# Patient Record
Sex: Male | Born: 1960 | Race: White | Hispanic: No | Smoking: Current every day smoker
Health system: Southern US, Community
[De-identification: ages and names within clinical notes are randomized; demographics above are authoritative.]

## PROBLEM LIST (undated history)

## (undated) DIAGNOSIS — M109 Gout, unspecified: Secondary | ICD-10-CM

## (undated) DIAGNOSIS — I10 Essential (primary) hypertension: Secondary | ICD-10-CM

## (undated) DIAGNOSIS — I209 Angina pectoris, unspecified: Secondary | ICD-10-CM

## (undated) DIAGNOSIS — F32A Depression, unspecified: Secondary | ICD-10-CM

## (undated) DIAGNOSIS — Z0000 Encounter for general adult medical examination without abnormal findings: Secondary | ICD-10-CM

## (undated) DIAGNOSIS — M25559 Pain in unspecified hip: Secondary | ICD-10-CM

## (undated) DIAGNOSIS — M549 Dorsalgia, unspecified: Secondary | ICD-10-CM

## (undated) DIAGNOSIS — F419 Anxiety disorder, unspecified: Secondary | ICD-10-CM

## (undated) DIAGNOSIS — Z720 Tobacco use: Secondary | ICD-10-CM

## (undated) DIAGNOSIS — M1990 Unspecified osteoarthritis, unspecified site: Secondary | ICD-10-CM

## (undated) DIAGNOSIS — M79643 Pain in unspecified hand: Secondary | ICD-10-CM

## (undated) DIAGNOSIS — R079 Chest pain, unspecified: Secondary | ICD-10-CM

## (undated) DIAGNOSIS — I739 Peripheral vascular disease, unspecified: Secondary | ICD-10-CM

## (undated) DIAGNOSIS — E785 Hyperlipidemia, unspecified: Secondary | ICD-10-CM

## (undated) DIAGNOSIS — K219 Gastro-esophageal reflux disease without esophagitis: Secondary | ICD-10-CM

## (undated) DIAGNOSIS — M545 Low back pain: Secondary | ICD-10-CM

## (undated) DIAGNOSIS — R05 Cough: Secondary | ICD-10-CM

## (undated) DIAGNOSIS — E119 Type 2 diabetes mellitus without complications: Secondary | ICD-10-CM

## (undated) DIAGNOSIS — N529 Male erectile dysfunction, unspecified: Secondary | ICD-10-CM

## (undated) DIAGNOSIS — R0600 Dyspnea, unspecified: Secondary | ICD-10-CM

## (undated) DIAGNOSIS — G4700 Insomnia, unspecified: Secondary | ICD-10-CM

## (undated) HISTORY — DX: Type 2 diabetes mellitus without complications: E11.9

## (undated) HISTORY — DX: Chest pain, unspecified: R07.9

## (undated) HISTORY — DX: Gout, unspecified: M10.9

## (undated) HISTORY — DX: Encounter for general adult medical examination without abnormal findings: Z00.00

## (undated) HISTORY — DX: Dorsalgia, unspecified: M54.9

## (undated) HISTORY — DX: Low back pain: M54.5

## (undated) HISTORY — DX: Hyperlipidemia, unspecified: E78.5

## (undated) HISTORY — DX: Pain in unspecified hip: M25.559

## (undated) HISTORY — DX: Tobacco use: Z72.0

## (undated) HISTORY — PX: CARDIAC CATHETERIZATION: SHX172

## (undated) HISTORY — DX: Gastro-esophageal reflux disease without esophagitis: K21.9

## (undated) HISTORY — PX: OTHER SURGICAL HISTORY: SHX169

## (undated) HISTORY — PX: ILIAC ARTERY STENT: SHX1786

## (undated) HISTORY — DX: Male erectile dysfunction, unspecified: N52.9

## (undated) HISTORY — DX: Insomnia, unspecified: G47.00

## (undated) HISTORY — DX: Peripheral vascular disease, unspecified: I73.9

## (undated) HISTORY — DX: Pain in unspecified hand: M79.643

## (undated) HISTORY — DX: Cough: R05

---

## 1998-09-24 ENCOUNTER — Emergency Department (HOSPITAL_COMMUNITY): Admission: EM | Admit: 1998-09-24 | Discharge: 1998-09-24 | Payer: Self-pay | Admitting: Emergency Medicine

## 1999-04-20 ENCOUNTER — Encounter: Payer: Self-pay | Admitting: Emergency Medicine

## 1999-04-20 ENCOUNTER — Emergency Department (HOSPITAL_COMMUNITY): Admission: EM | Admit: 1999-04-20 | Discharge: 1999-04-20 | Payer: Self-pay | Admitting: Emergency Medicine

## 2009-09-23 ENCOUNTER — Observation Stay (HOSPITAL_COMMUNITY): Admission: EM | Admit: 2009-09-23 | Discharge: 2009-09-24 | Payer: Self-pay | Admitting: Emergency Medicine

## 2009-10-13 ENCOUNTER — Ambulatory Visit: Payer: Self-pay | Admitting: Family Medicine

## 2009-10-13 LAB — CONVERTED CEMR LAB: Microalb, Ur: 0.5 mg/dL (ref 0.00–1.89)

## 2009-10-14 ENCOUNTER — Ambulatory Visit (HOSPITAL_COMMUNITY): Admission: RE | Admit: 2009-10-14 | Discharge: 2009-10-14 | Payer: Self-pay | Admitting: Family Medicine

## 2010-06-20 LAB — CK TOTAL AND CKMB (NOT AT ARMC)
CK, MB: 1.8 ng/mL (ref 0.3–4.0)
Relative Index: 1 (ref 0.0–2.5)
Relative Index: 1 (ref 0.0–2.5)
Total CK: 185 U/L (ref 7–232)
Total CK: 200 U/L (ref 7–232)

## 2010-06-20 LAB — DIFFERENTIAL
Basophils Absolute: 0 10*3/uL (ref 0.0–0.1)
Basophils Relative: 1 % (ref 0–1)
Eosinophils Absolute: 0.1 10*3/uL (ref 0.0–0.7)
Eosinophils Relative: 1 % (ref 0–5)
Lymphocytes Relative: 29 % (ref 12–46)
Monocytes Absolute: 0.7 10*3/uL (ref 0.1–1.0)

## 2010-06-20 LAB — BASIC METABOLIC PANEL
CO2: 22 mEq/L (ref 19–32)
Chloride: 107 mEq/L (ref 96–112)
GFR calc Af Amer: >60 mL/min (ref >60)
Sodium: 137 mEq/L (ref 135–145)

## 2010-06-20 LAB — GLUCOSE, CAPILLARY: Glucose-Capillary: 106 mg/dL — ABNORMAL HIGH (ref 70–99)

## 2010-06-20 LAB — URINE MICROSCOPIC-ADD ON

## 2010-06-20 LAB — URINALYSIS, ROUTINE W REFLEX MICROSCOPIC
Glucose, UA: >1000 mg/dL — AB
Hgb urine dipstick: NEGATIVE
Leukocytes, UA: NEGATIVE
Protein, ur: 30 mg/dL — AB
Specific Gravity, Urine: 1.031 — ABNORMAL HIGH (ref 1.005–1.030)
Urobilinogen, UA: 0.2 mg/dL (ref 0.0–1.0)

## 2010-06-20 LAB — CBC
Hemoglobin: 15.1 g/dL (ref 13.0–17.0)
MCH: 30.5 pg (ref 26.0–34.0)
Platelets: 294 10*3/uL (ref 150–400)
RBC: 4.94 MIL/uL (ref 4.22–5.81)
WBC: 8.6 10*3/uL (ref 4.0–10.5)

## 2010-06-20 LAB — URINE CULTURE
Colony Count: NO GROWTH
Culture: NO GROWTH

## 2010-06-20 LAB — COMPREHENSIVE METABOLIC PANEL
ALT: 23 U/L (ref 0–53)
AST: 28 U/L (ref 0–37)
Albumin: 3.8 g/dL (ref 3.5–5.2)
Alkaline Phosphatase: 63 U/L (ref 39–117)
CO2: 25 mEq/L (ref 19–32)
Chloride: 97 mEq/L (ref 96–112)
GFR calc Af Amer: 51 mL/min — ABNORMAL LOW (ref >60)
GFR calc non Af Amer: 43 mL/min — ABNORMAL LOW (ref >60)
Potassium: 3.7 mEq/L (ref 3.5–5.1)
Sodium: 133 mEq/L — ABNORMAL LOW (ref 135–145)
Total Bilirubin: 0.7 mg/dL (ref 0.3–1.2)

## 2010-06-20 LAB — TROPONIN I: Troponin I: <0.01 ng/mL (ref 0.00–0.06)

## 2010-06-20 LAB — POCT CARDIAC MARKERS: Myoglobin, poc: 114 ng/mL (ref 12–200)

## 2010-06-20 LAB — PHOSPHORUS: Phosphorus: 4 mg/dL (ref 2.3–4.6)

## 2010-06-20 LAB — KETONES, QUALITATIVE: Acetone, Bld: NEGATIVE

## 2010-06-20 LAB — POCT I-STAT 3, VENOUS BLOOD GAS (G3P V)
Acid-Base Excess: 1 mmol/L (ref 0.0–2.0)
O2 Saturation: 98 %
pO2, Ven: 102 mmHg — ABNORMAL HIGH (ref 30.0–45.0)

## 2011-12-01 ENCOUNTER — Emergency Department (HOSPITAL_COMMUNITY): Payer: Self-pay

## 2011-12-01 ENCOUNTER — Emergency Department (HOSPITAL_COMMUNITY)
Admission: EM | Admit: 2011-12-01 | Discharge: 2011-12-01 | Disposition: A | Discharge status: Home / Self Care | Payer: Self-pay | Attending: Emergency Medicine | Admitting: Emergency Medicine

## 2011-12-01 ENCOUNTER — Encounter (HOSPITAL_COMMUNITY): Payer: Self-pay | Admitting: Adult Health

## 2011-12-01 DIAGNOSIS — R202 Paresthesia of skin: Secondary | ICD-10-CM | POA: Diagnosis present

## 2011-12-01 DIAGNOSIS — R0602 Shortness of breath: Secondary | ICD-10-CM | POA: Insufficient documentation

## 2011-12-01 DIAGNOSIS — I498 Other specified cardiac arrhythmias: Secondary | ICD-10-CM | POA: Diagnosis present

## 2011-12-01 DIAGNOSIS — E119 Type 2 diabetes mellitus without complications: Secondary | ICD-10-CM | POA: Insufficient documentation

## 2011-12-01 DIAGNOSIS — F17200 Nicotine dependence, unspecified, uncomplicated: Secondary | ICD-10-CM | POA: Insufficient documentation

## 2011-12-01 DIAGNOSIS — R112 Nausea with vomiting, unspecified: Secondary | ICD-10-CM | POA: Insufficient documentation

## 2011-12-01 DIAGNOSIS — R209 Unspecified disturbances of skin sensation: Secondary | ICD-10-CM | POA: Insufficient documentation

## 2011-12-01 DIAGNOSIS — I10 Essential (primary) hypertension: Secondary | ICD-10-CM | POA: Insufficient documentation

## 2011-12-01 DIAGNOSIS — Z79899 Other long term (current) drug therapy: Secondary | ICD-10-CM | POA: Insufficient documentation

## 2011-12-01 DIAGNOSIS — I4902 Ventricular flutter: Secondary | ICD-10-CM | POA: Insufficient documentation

## 2011-12-01 HISTORY — DX: Essential (primary) hypertension: I10

## 2011-12-01 LAB — BASIC METABOLIC PANEL
BUN: 27 mg/dL — ABNORMAL HIGH (ref 6–23)
GFR calc Af Amer: 80 mL/min — ABNORMAL LOW (ref >90)
GFR calc non Af Amer: 69 mL/min — ABNORMAL LOW (ref >90)
Potassium: 4.2 mEq/L (ref 3.5–5.1)
Sodium: 136 mEq/L (ref 135–145)

## 2011-12-01 LAB — CBC WITH DIFFERENTIAL/PLATELET
Basophils Absolute: 0 10*3/uL (ref 0.0–0.1)
Basophils Relative: 1 % (ref 0–1)
Eosinophils Absolute: 0.2 10*3/uL (ref 0.0–0.7)
MCH: 29.5 pg (ref 26.0–34.0)
MCHC: 34.8 g/dL (ref 30.0–36.0)
Neutro Abs: 4.9 10*3/uL (ref 1.7–7.7)
Neutrophils Relative %: 57 % (ref 43–77)
Platelets: 344 10*3/uL (ref 150–400)
RDW: 12.7 % (ref 11.5–15.5)

## 2011-12-01 LAB — POCT I-STAT TROPONIN I

## 2011-12-01 LAB — GLUCOSE, CAPILLARY: Glucose-Capillary: 293 mg/dL — ABNORMAL HIGH (ref 70–99)

## 2011-12-01 MED ORDER — SODIUM CHLORIDE 0.9 % IV BOLUS (SEPSIS)
1000.0000 mL | INTRAVENOUS | Status: AC
  Administered 2011-12-01: 1000 mL via INTRAVENOUS

## 2011-12-01 MED ORDER — ONDANSETRON HCL 4 MG/2ML IJ SOLN
4.0000 mg | Freq: Once | INTRAMUSCULAR | Status: AC
  Administered 2011-12-01: 4 mg via INTRAVENOUS
  Filled 2011-12-01: qty 2

## 2011-12-01 NOTE — ED Notes (Signed)
Patient transported to MRI 

## 2011-12-01 NOTE — ED Notes (Signed)
Pt ambulating independently w/ steady gait on d/c in no acute distress, A&Ox4. D/c instructions reviewed w/ pt - pt denies any further questions or concerns at present.  

## 2011-12-01 NOTE — ED Notes (Signed)
Pt presents w/ c/o n/v x4 episodes that occurred approx 0800 this a.m. - pt states he has been experiencing nausea since being prescribed glimiperide by PCP recently. Pt took his glimiperide at work then began experiencing n/v - afterwards pt also noted rt arm numbness/tingling, pt states sensation has decreased at present. Denies any lightheadedness or dizziness - pt also w/ hx of neuropathy.

## 2011-12-01 NOTE — ED Provider Notes (Signed)
I saw and evaluated the patient, reviewed the resident's note and I agree with the findings including ECG and plan.  Last known well 0800 woke with nausea and about 30 minutes later developed vomiting as well as right arm is slight numbness and tingling without pain or weakness. He has had some neck pain and right shoulder pain in the past but has not had any in the last couple weeks. He is no chest pain palpitation shortness breath. He is no change in his baseline weakness and numbness and tingling to his legs. He has no facial symptoms. He has had over 8 hours of slight numbness tingling feeling to his entire right arm without other strokelike symptoms so he is not a Code Stroke candidate nor is he having a definite CVA upon initial ED eval.[id number]  Hurman Horn, MD 12/03/11 1758

## 2011-12-01 NOTE — ED Notes (Signed)
The patient's CBG was 293.

## 2011-12-01 NOTE — ED Provider Notes (Signed)
History     CSN: [id number]  Arrival date & time 12/01/11  1527   None     Chief Complaint  Patient presents with  . Nausea  . Emesis  . Numbness    (Consider location/radiation/quality/duration/timing/severity/associated sxs/prior treatment) Patient is a 51 y.o. male presenting with neurologic complaint. The history is provided by the patient.  Neurologic Problem The primary symptoms include paresthesias, nausea and vomiting. Primary symptoms do not include headaches or fever. The symptoms began 6 to 12 hours ago. The episode lasted 9 hours. The symptoms are unchanged. The neurological symptoms are focal. Context: 1 hr after vomiting.  Paresthesias began 6 - 12 hours ago. The paresthesias are unchanged. The paresthesias are described as tingling. Location of paresthesias: RUE.  Additional symptoms do not include weakness or loss of balance.    Past Medical History  Diagnosis Date  . Diabetes mellitus   . Irregular heart beat   . Hypertension     History reviewed. No pertinent past surgical history.  History reviewed. No pertinent family history.  History  Substance Use Topics  . Smoking status: Current Some Day Smoker  . Smokeless tobacco: Not on file  . Alcohol Use: No      Review of Systems  Constitutional: Negative for fever.  HENT: Negative for rhinorrhea, drooling and neck pain.   Eyes: Positive for visual disturbance (pt notes mild blurry vision x 1 year). Negative for pain.  Respiratory: Positive for shortness of breath (mild exertional sob). Negative for cough.   Cardiovascular: Negative for chest pain and leg swelling.  Gastrointestinal: Positive for nausea, vomiting and diarrhea. Negative for abdominal pain.  Genitourinary: Negative for dysuria and hematuria.  Musculoskeletal: Negative for gait problem.  Skin: Negative for color change.  Neurological: Positive for paresthesias. Negative for weakness, numbness, headaches and loss of balance.    Hematological: Negative for adenopathy.  Psychiatric/Behavioral: Negative for behavioral problems.  All other systems reviewed and are negative.    Allergies  Review of patient's allergies indicates no known allergies.  Home Medications   Current Outpatient Rx  Name Route Sig Dispense Refill  . FAMOTIDINE 20 MG PO TABS Oral Take 20 mg by mouth 2 (two) times daily.    [name] GABAPENTIN 100 MG PO CAPS Oral Take 100 mg by mouth 2 (two) times daily.    [name] GLIMEPIRIDE 4 MG PO TABS Oral Take 4 mg by mouth daily before breakfast.    . LISINOPRIL-HYDROCHLOROTHIAZIDE 10-12.5 MG PO TABS Oral Take 1 tablet by mouth daily.    [name] METFORMIN HCL 1000 MG PO TABS Oral Take 1,000 mg by mouth 2 (two) times daily with a meal.      BP 137/87  Pulse 60  Temp 98.3 F (36.8 C) (Oral)  Resp 16  SpO2 100%  Physical Exam  Nursing note and vitals reviewed. Constitutional: He is oriented to person, place, and time. He appears well-developed and well-nourished.  HENT:  Head: Normocephalic and atraumatic.  Right Ear: External ear normal.  Left Ear: External ear normal.  Nose: Nose normal.  Mouth/Throat: Oropharynx is clear and moist. No oropharyngeal exudate.  Eyes: Conjunctivae and EOM are normal. Pupils are equal, round, and reactive to light.  Neck: Normal range of motion. Neck supple.  Cardiovascular: Normal rate, regular rhythm, normal heart sounds and intact distal pulses.  Exam reveals no gallop and no friction rub.   No murmur heard. Pulmonary/Chest: Effort normal and breath sounds normal. No respiratory distress. He has no wheezes.  Abdominal: Soft. Bowel sounds are normal. He exhibits no distension. There is no tenderness. There is no rebound and no guarding.  Musculoskeletal: Normal range of motion. He exhibits no edema and no tenderness.  Neurological: He is alert and oriented to person, place, and time. He has normal strength. No cranial nerve deficit or sensory deficit. He displays a negative  Romberg sign. Coordination and gait normal.  Skin: Skin is warm and dry.  Psychiatric: He has a normal mood and affect. His behavior is normal.    ED Course  Procedures (including critical care time)  Labs Reviewed  BASIC METABOLIC PANEL - Abnormal; Notable for the following:    Glucose, Bld 306 (*)     BUN 27 (*)     GFR calc non Af Amer 69 (*)     GFR calc Af Amer 80 (*)     All other components within normal limits  GLUCOSE, CAPILLARY - Abnormal; Notable for the following:    Glucose-Capillary 293 (*)     All other components within normal limits  CBC WITH DIFFERENTIAL  POCT I-STAT TROPONIN I  POCT I-STAT TROPONIN I   Dg Chest 2 View  12/01/2011  *RADIOLOGY REPORT*  Clinical Data: Fluttering feeling in chest  CHEST - 2 VIEW  Comparison: 09/23/2009  Findings: Lungs are essentially clear.  No pleural effusion or pneumothorax.  Cardiomediastinal silhouette is within normal limits.  Mild degenerative changes of the visualized thoracolumbar spine.  IMPRESSION: No evidence of acute cardiopulmonary disease.   Original Report Authenticated By: Charline Bills, M.D.    Ct Head Wo Contrast  12/01/2011  *RADIOLOGY REPORT*  Clinical Data: Nausea, weakness, right arm numbness and tingling.  CT HEAD WITHOUT CONTRAST  Technique:  Contiguous axial images were obtained from the base of the skull through the vertex without contrast.  Comparison: Head CT scan 09/23/2009.  Findings: The brain appears normal without evidence of infarct, hemorrhage, mass lesion, mass effect, midline shift or abnormal extra-axial fluid collection.  No hydrocephalus or pneumocephalus. Calvarium intact.  IMPRESSION: Negative head CT.   Original Report Authenticated By: Bernadene Bell. Maricela Curet, M.D.    Mr Brain Wo Contrast  12/01/2011  *RADIOLOGY REPORT*  Clinical Data: Right upper extremity parasthesias.  Nausea and headaches.  MRI HEAD WITHOUT CONTRAST  Technique:  Multiplanar, multiecho pulse sequences of the brain and  surrounding structures were obtained according to standard protocol without intravenous contrast.  Comparison: CT head without contrast 12/01/2011.  Findings: The diffusion weighted images demonstrate no evidence for acute or subacute infarction.  There is a remote lacunar infarct of the right corona radiata.  Remote lacunar infarcts are present in the right cerebellum.  No significant white matter disease is present otherwise.  Flow is present in the major intracranial arteries.  The globes and orbits are intact.  The paranasal sinuses and mastoid air cells are clear.  IMPRESSION:  1.  No acute intracranial abnormality. 2.  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   Original Report Authenticated By: Jamesetta Orleans. MATTERN, M.D.      1. Paresthesia of right arm   2. Periodic heart flutter      Date: 12/01/2011  Rate: 88  Rhythm: normal sinus rhythm  QRS Axis: normal  Intervals: normal  ST/T Wave abnormalities: Inverted T waves in inferior leads  Conduction Disutrbances:incomplete RBBB  Narrative Interpretation: Pt has similar inverted T waves in inf leads on old ecg, also biphasic appearing t waves in anterolateral precordial leads  also noted on old ecg, no new findings concerning for ischemia  Old EKG Reviewed: changes noted    MDM  11:30 PM 51 y.o. male w hx of DM, HTN who pw RUE numbness/tingling that began this morning at approx 9am. Pt notes emesis x4 and nausea upon arriving to work this am, developed RUE Numb/tingling approx 1 hr later. Pt AFVSS here, appears well on exam, neurologically intact. Will get screening labs, CT head, delta trop.   CT head negative. Will get MRI.   11:30 PM: MRI shows no acute abnormality. Delta trop neg, other labs non-contrib.  I have discussed the diagnosis/risks/treatment options with the patient and believe the pt to be eligible for discharge home to follow-up with pcp in 1-2 days if no better. We also discussed  returning to the ED immediately if new or worsening sx occur. We discussed the sx which are most concerning (e.g., cp, sob, worsening paresthesias) that necessitate immediate return. Any new prescriptions provided to the patient are listed below.  New Prescriptions   No medications on file   Clinical Impression 1. Paresthesia of right arm   2. Periodic heart flutter          Purvis Sheffield, MD 12/01/11 2330

## 2011-12-01 NOTE — ED Notes (Addendum)
c/o nausea, weakness and right arm numbness and tingling to finger tips associated with blurry vision that "makes everything seem like there is a film of snow on it", associated with right sided "fluttering in chest"  began at 8 am today. BP 138/88. Neurologically intact, no arm drift, no facial droop, follows commands, speech is clear, answers all questions appropriately. Reports starting new diabetic medication one week ago and 40 pound weight loss over the last 4 months. Right arm grip equal to left arm grip, both weak. Pt states he has difficulty gripping things all the time for one year.

## 2012-07-10 ENCOUNTER — Encounter (HOSPITAL_COMMUNITY): Payer: Self-pay | Admitting: *Deleted

## 2012-07-10 ENCOUNTER — Emergency Department (HOSPITAL_COMMUNITY)
Admission: EM | Admit: 2012-07-10 | Discharge: 2012-07-10 | Disposition: A | Discharge status: Home / Self Care | Payer: No Typology Code available for payment source | Source: Home / Self Care | Attending: Family Medicine | Admitting: Family Medicine

## 2012-07-10 DIAGNOSIS — E119 Type 2 diabetes mellitus without complications: Secondary | ICD-10-CM | POA: Diagnosis present

## 2012-07-10 DIAGNOSIS — I10 Essential (primary) hypertension: Secondary | ICD-10-CM

## 2012-07-10 DIAGNOSIS — IMO0001 Reserved for inherently not codable concepts without codable children: Secondary | ICD-10-CM

## 2012-07-10 DIAGNOSIS — Z720 Tobacco use: Secondary | ICD-10-CM | POA: Diagnosis present

## 2012-07-10 DIAGNOSIS — R002 Palpitations: Secondary | ICD-10-CM | POA: Diagnosis present

## 2012-07-10 DIAGNOSIS — E1165 Type 2 diabetes mellitus with hyperglycemia: Secondary | ICD-10-CM

## 2012-07-10 HISTORY — DX: Essential (primary) hypertension: I10

## 2012-07-10 HISTORY — DX: Unspecified osteoarthritis, unspecified site: M19.90

## 2012-07-10 HISTORY — DX: Tobacco use: Z72.0

## 2012-07-10 HISTORY — DX: Type 2 diabetes mellitus without complications: E11.9

## 2012-07-10 HISTORY — DX: Peripheral vascular disease, unspecified: I73.9

## 2012-07-10 LAB — COMPREHENSIVE METABOLIC PANEL
Alkaline Phosphatase: 87 U/L (ref 39–117)
BUN: 12 mg/dL (ref 6–23)
Chloride: 102 mEq/L (ref 96–112)
GFR calc Af Amer: >90 mL/min (ref >90)
Glucose, Bld: 151 mg/dL — ABNORMAL HIGH (ref 70–99)
Potassium: 4.3 mEq/L (ref 3.5–5.1)
Total Bilirubin: 0.2 mg/dL — ABNORMAL LOW (ref 0.3–1.2)
Total Protein: 8 g/dL (ref 6.0–8.3)

## 2012-07-10 LAB — MICROALBUMIN / CREATININE URINE RATIO
Creatinine, Urine: 134.8 mg/dL
Microalb, Ur: 4.17 mg/dL — ABNORMAL HIGH (ref 0.00–1.89)

## 2012-07-10 LAB — HEMOGLOBIN A1C: Hgb A1c MFr Bld: 8.2 % — ABNORMAL HIGH (ref <5.7)

## 2012-07-10 LAB — LIPID PANEL
LDL Cholesterol: 149 mg/dL — ABNORMAL HIGH (ref 0–99)
VLDL: 36 mg/dL (ref 0–40)

## 2012-07-10 LAB — CBC
HCT: 42.6 % (ref 39.0–52.0)
Hemoglobin: 14.9 g/dL (ref 13.0–17.0)
MCHC: 35 g/dL (ref 30.0–36.0)
MCV: 83.5 fL (ref 78.0–100.0)

## 2012-07-10 MED ORDER — CLOPIDOGREL BISULFATE 75 MG PO TABS: 75.0000 mg | ORAL_TABLET | Freq: Every day | ORAL | Status: DC

## 2012-07-10 MED ORDER — ASPIRIN EC 81 MG PO TBEC: 81.0000 mg | DELAYED_RELEASE_TABLET | Freq: Every day | ORAL | Status: DC

## 2012-07-10 MED ORDER — INSULIN NPH ISOPHANE & REGULAR (70-30) 100 UNIT/ML ~~LOC~~ SUSP: 14.0000 [IU] | Freq: Two times a day (BID) | SUBCUTANEOUS | Status: DC

## 2012-07-10 MED ORDER — PRAVASTATIN SODIUM 20 MG PO TABS: 20.0000 mg | ORAL_TABLET | Freq: Every day | ORAL | Status: DC

## 2012-07-10 MED ORDER — INSULIN GLARGINE 100 UNIT/ML ~~LOC~~ SOLN: 20.0000 [IU] | Freq: Every day | SUBCUTANEOUS | Status: DC

## 2012-07-10 MED ORDER — ATENOLOL 25 MG PO TABS: 25.0000 mg | ORAL_TABLET | Freq: Every day | ORAL | Status: DC

## 2012-07-10 MED ORDER — NITROGLYCERIN 0.3 MG SL SUBL: 0.3000 mg | SUBLINGUAL_TABLET | SUBLINGUAL | Status: DC | PRN

## 2012-07-10 MED ORDER — INFLUENZA VIRUS VACC SPLIT PF IM SUSP
0.5000 mL | Freq: Once | INTRAMUSCULAR | Status: AC
  Administered 2012-07-10: 0.5 mL via INTRAMUSCULAR

## 2012-07-10 NOTE — ED Provider Notes (Signed)
History     CSN: [id number]  Arrival date & time 07/10/12  1139   First MD Initiated Contact with Patient 07/10/12 1246      CC:  Establish   (Consider location/radiation/quality/duration/timing/severity/associated sxs/prior treatment) HPI Pt presenting to follow up on diabetes mellitus, controlled with oral medications.  Pt is taking 20 units of lantus at bedtime and taking novolin 70/30 take 14 units with meals BID, taking metformin 1000 mg bid, pt says that he is not having low BS but he is having BS that have been over 300.    Past Medical History  Diagnosis Date  . Irregular heart beat   . Arthritis     PVD   History reviewed. No pertinent past surgical history.  Family History  Problem Relation Age of Onset  . Heart failure Mother   . Osteoarthritis Mother     History  Substance Use Topics  . Smoking status: Current Some Day Smoker -- 0.50 packs/day  . Smokeless tobacco: Not on file  . Alcohol Use: Yes    Review of Systems Constitutional: Negative.  HENT: Negative.  Respiratory: Negative.  Cardiovascular: Negative.  Gastrointestinal: Negative.  Endocrine: Negative.  Genitourinary: Negative.  Musculoskeletal: Negative.  Skin: Negative.  Allergic/Immunologic: Negative.  Neurological: Negative.  Hematological: Negative.  Psychiatric/Behavioral: Negative.  All other systems reviewed and are negative  Allergies  Review of patient's allergies indicates no known allergies.  Home Medications     BP 114/67  Pulse 63  Temp(Src) 98.4 F (36.9 C) (Oral)  Resp 18  SpO2 100%  Physical Exam Nursing note and vitals reviewed.  Constitutional: He is oriented to person, place, and time. He appears well-developed and well-nourished. No distress.  Eyes: Conjunctivae and EOM are normal. Pupils are equal, round, and reactive to light.  Neck: Normal range of motion. Neck supple. No JVD present. No thyromegaly present.  Cardiovascular: Normal rate, regular rhythm  and normal heart sounds.  No murmur heard.  Pulmonary/Chest: Effort normal and breath sounds normal. No respiratory distress.  Abdominal: Soft. Bowel sounds are normal.  Musculoskeletal: Normal range of motion. He exhibits no edema.  Lymphadenopathy:  He has no cervical adenopathy.  Neurological: He is oriented to person, place, and time. Coordination normal.  Skin: Skin is warm and dry. No rash noted. No erythema. No pallor.  Psychiatric: He has a normal mood and affect. His behavior is normal. Judgment and thought content normal.   ED Course  Procedures (including critical care time)  Labs Reviewed - No data to display No results found.  No diagnosis found.   MDM  IMPRESSION  Uncontrolled diabetes mellitus type 2  Hypertension  Hyperlipidemia  Tobacco use  RECOMMENDATIONS / PLAN Pt says that he doesn't know what his A1c.  Obtain medical records   Check his labs today Monitor BS closely at home The patient was counseled on the dangers of tobacco use, and was advised to quit.  Reviewed strategies to maximize success, including removing cigarettes and smoking materials from environment, stress management and written materials. Flu vaccine given today  FOLLOW UP 2 weeks to follow up DM   The patient was given clear instructions to go to ER or return to medical center if symptoms don't improve, worsen or new problems develop.  The patient verbalized understanding.  The patient was told to call to get lab results if they haven't heard anything in the next week.            Clanford Cyndie Mull, MD  07/10/12 1259 

## 2012-07-10 NOTE — ED Notes (Signed)
Follow up with Diabetes

## 2012-07-11 ENCOUNTER — Telehealth (HOSPITAL_COMMUNITY): Payer: Self-pay

## 2012-07-11 NOTE — Progress Notes (Signed)
Quick Note:  Please inform patient that his diabetes is poorly controlled as evidenced by an A1c of 8.9%. His is at high risk for Acute and Chronic complications of poorly controlled diabetes mellitus. Pt has high cholesterol levels. He needs to take his medications as prescribed and follow up in 2 weeks. We need to consider starting basal bolus insulin for better glycemic control. We can discuss further when he returns in 2 weeks. His urine testing is showing early kidney damage from the diabetes.   Rodney Langton, MD, CDE, FAAFP [facility] [place], [place]   ______

## 2012-07-11 NOTE — ED Notes (Signed)
Lab results given appt for follow up given for 4/18

## 2012-07-20 ENCOUNTER — Emergency Department (HOSPITAL_COMMUNITY)
Admission: EM | Admit: 2012-07-20 | Discharge: 2012-07-20 | Disposition: A | Discharge status: Home Health | Payer: No Typology Code available for payment source | Source: Home / Self Care

## 2012-07-20 ENCOUNTER — Encounter (HOSPITAL_COMMUNITY): Payer: Self-pay

## 2012-07-20 DIAGNOSIS — I739 Peripheral vascular disease, unspecified: Secondary | ICD-10-CM

## 2012-07-20 DIAGNOSIS — I10 Essential (primary) hypertension: Secondary | ICD-10-CM

## 2012-07-20 MED ORDER — INSULIN ASPART 100 UNIT/ML ~~LOC~~ SOLN: 6.0000 [IU] | Freq: Three times a day (TID) | SUBCUTANEOUS | Status: DC

## 2012-07-20 MED ORDER — INSULIN SYRINGES (DISPOSABLE) U-100 0.3 ML MISC: Status: DC

## 2012-07-20 MED ORDER — PRAVASTATIN SODIUM 20 MG PO TABS: 40.0000 mg | ORAL_TABLET | Freq: Every day | ORAL | Status: DC

## 2012-07-20 MED ORDER — INSULIN GLARGINE 100 UNIT/ML ~~LOC~~ SOLN: 25.0000 [IU] | Freq: Every day | SUBCUTANEOUS | Status: DC

## 2012-07-20 NOTE — ED Notes (Signed)
Follow up DM

## 2012-07-20 NOTE — Progress Notes (Signed)
Patient Demographics  Dylan Anderson, is a 52 y.o. male  ZOX:[id number]  WJX:[id number]  DOB - Jul 27, 1960  Chief Complaint  Patient presents with  . Follow-up        Subjective:   Dylan Anderson today is here for a follow up vist/. Patient has No headache, No chest pain, No abdominal pain - No Nausea, No new weakness tingling or numbness, No Cough - SOB.  He claims that last month he was at [facility]- he had a stent placed by a cardiologist in his leg.  Objective:    Filed Vitals:   07/20/12 1040  BP: 110/69  Pulse: 66  Temp: 97.7 F (36.5 C)  Resp: 18  SpO2: 100%     ALLERGIES:  No Known Allergies  PAST MEDICAL HISTORY: Past Medical History  Diagnosis Date  . Irregular heart beat   . Arthritis   . PVD (peripheral vascular disease)   . Claudication   . Type 2 diabetes mellitus   . Hypertension 07/10/2012    MEDICATIONS AT HOME: Prior to Admission medications   Medication Sig Start Date End Date Taking? Authorizing Provider  aspirin EC 81 MG tablet Take 1 tablet (81 mg total) by mouth daily. 07/10/12   Clanford Cyndie Mull, MD  atenolol (TENORMIN) 25 MG tablet Take 1 tablet (25 mg total) by mouth daily. 07/10/12   Clanford Cyndie Mull, MD  clopidogrel (PLAVIX) 75 MG tablet Take 1 tablet (75 mg total) by mouth daily. 07/10/12   Clanford Cyndie Mull, MD  famotidine (PEPCID) 20 MG tablet Take 20 mg by mouth 2 (two) times daily.    Historical Provider, MD  gabapentin (NEURONTIN) 100 MG capsule Take 100 mg by mouth 2 (two) times daily.    Historical Provider, MD  insulin aspart (NOVOLOG) 100 UNIT/ML injection Inject 6 Units into the skin 3 (three) times daily before meals. 07/20/12   Shanker Levora Dredge, MD  insulin glargine (LANTUS) 100 UNIT/ML injection Inject 0.25 mLs (25 Units total) into the skin at bedtime. 07/20/12   Shanker Levora Dredge, MD  Insulin Syringes, Disposable, U-100 0.3 ML MISC As needed 07/20/12   Maretta Bees, MD  lisinopril-hydrochlorothiazide  (PRINZIDE,ZESTORETIC) 10-12.5 MG per tablet Take 1 tablet by mouth daily.    Historical Provider, MD  metFORMIN (GLUCOPHAGE) 1000 MG tablet Take 1,000 mg by mouth 2 (two) times daily with a meal.    Historical Provider, MD  nitroGLYCERIN (NITROSTAT) 0.3 MG SL tablet Place 1 tablet (0.3 mg total) under the tongue every 5 (five) minutes as needed for chest pain. 07/10/12   Clanford Cyndie Mull, MD  pravastatin (PRAVACHOL) 20 MG tablet Take 2 tablets (40 mg total) by mouth daily. 07/20/12   Shanker Levora Dredge, MD     Exam  General appearance :Awake, alert, not in any distress. Speech Clear. Not toxic Looking HEENT: Atraumatic and Normocephalic, pupils equally reactive to light and accomodation Neck: supple, no JVD. No cervical lymphadenopathy.  Chest:Good air entry bilaterally, no added sounds  CVS: S1 S2 regular, no murmurs.  Abdomen: Bowel sounds present, Non tender and not distended with no gaurding, rigidity or rebound. Extremities: B/L Lower Ext shows no edema, both legs are warm to touch Neurology: Awake alert, and oriented X 3, CN II-XII intact, Non focal Skin:No Rash Wounds:N/A    Data Review   CBC No results found for this basename: WBC, HGB, HCT, PLT, MCV, MCH, MCHC, RDW, NEUTRABS, LYMPHSABS, MONOABS, EOSABS, BASOSABS, BANDABS, BANDSABD,  in the last 168 hours  Chemistries   No results found for this basename: NA, K, CL, CO2, GLUCOSE, BUN, CREATININE, GFRCGP, CALCIUM, MG, AST, ALT, ALKPHOS, BILITOT,  in the last 168 hours ------------------------------------------------------------------------------------------------------------------ No results found for this basename: HGBA1C,  in the last 72 hours ------------------------------------------------------------------------------------------------------------------ No results found for this basename: CHOL, HDL, LDLCALC, TRIG, CHOLHDL, LDLDIRECT,  in the last 72  hours ------------------------------------------------------------------------------------------------------------------ No results found for this basename: TSH, T4TOTAL, FREET3, T3FREE, THYROIDAB,  in the last 72 hours ------------------------------------------------------------------------------------------------------------------ No results found for this basename: VITAMINB12, FOLATE, FERRITIN, TIBC, IRON, RETICCTPCT,  in the last 72 hours  Coagulation profile  No results found for this basename: INR, PROTIME,  in the last 168 hours    Assessment & Plan   Diabetes - Stop insulin 70/30 as he is on Lantus  - Start NovoLog 6 units with meals -diabetic education-symptoms of hypoglycemia was discussed in detail. - Continue with metformin - Have asked patient to make a log of his CBGs at least 2-3  times a day - Will bring the patient in one week for a quick visit to check his diabetic log- and make adjustments to his insulin regimen  Dyslipidemia - LDL 149 - Double pravastatin to 40 mg  Hypertension - controlled - Continue with Cipro/HCTZ and atenolol  Peripheral vascular disease - Claims he had a stent placed in his left leg in [facility] in March 2014 - Appears to be on 2 antiplatelet agents- continue - Will try and obtain the records from [facility] - Claims he see his cardiologist and has all scheduled-he has been encouraged to keep followup appointment  Followup in one Week- for quick visit-mainly to check logbook to see what his CBGs are  Follow-up Information   Follow up with [facility]. Schedule an appointment as soon as possible for a visit in 1 week.

## 2016-10-10 ENCOUNTER — Ambulatory Visit: Payer: Self-pay | Admitting: Family Medicine

## 2017-01-13 ENCOUNTER — Ambulatory Visit (INDEPENDENT_AMBULATORY_CARE_PROVIDER_SITE_OTHER): Payer: Self-pay | Admitting: Family Medicine

## 2017-01-13 ENCOUNTER — Encounter (INDEPENDENT_AMBULATORY_CARE_PROVIDER_SITE_OTHER): Payer: Self-pay

## 2017-01-13 ENCOUNTER — Encounter: Payer: Self-pay | Admitting: Family Medicine

## 2017-01-13 VITALS — BP 120/78 | HR 78 | Temp 98.3°F | Ht 70.0 in | Wt 144.0 lb

## 2017-01-13 DIAGNOSIS — E785 Hyperlipidemia, unspecified: Secondary | ICD-10-CM

## 2017-01-13 DIAGNOSIS — M545 Low back pain: Secondary | ICD-10-CM

## 2017-01-13 DIAGNOSIS — E7800 Pure hypercholesterolemia, unspecified: Secondary | ICD-10-CM

## 2017-01-13 DIAGNOSIS — G8929 Other chronic pain: Secondary | ICD-10-CM

## 2017-01-13 DIAGNOSIS — L729 Follicular cyst of the skin and subcutaneous tissue, unspecified: Secondary | ICD-10-CM

## 2017-01-13 DIAGNOSIS — N529 Male erectile dysfunction, unspecified: Secondary | ICD-10-CM

## 2017-01-13 DIAGNOSIS — E118 Type 2 diabetes mellitus with unspecified complications: Secondary | ICD-10-CM

## 2017-01-13 LAB — POCT URINALYSIS DIP (MANUAL ENTRY)
BILIRUBIN UA: NEGATIVE
GLUCOSE UA: NEGATIVE mg/dL
Ketones, POC UA: NEGATIVE mg/dL
Leukocytes, UA: NEGATIVE
Nitrite, UA: NEGATIVE
Protein Ur, POC: 30 mg/dL — AB
RBC UA: NEGATIVE
SPEC GRAV UA: 1.01 (ref 1.010–1.025)
UROBILINOGEN UA: 0.2 U/dL
pH, UA: 7 (ref 5.0–8.0)

## 2017-01-13 LAB — GLUCOSE, POCT (MANUAL RESULT ENTRY): POC Glucose: 158 mg/dl — AB (ref 70–99)

## 2017-01-13 NOTE — Patient Instructions (Signed)
Today we did a new patient history and physical We discussed your past medical problems, surgical problems, and current issues We are going to get a hemoglobin A1C, glucose, liver and kidney function test, lipid panel, and a urinalysis We will call you with these results We will set up an appointment for Ree Shay our financial counselor Will plan to have you follow up in our clinic in 1-2 weeks to potentially start medications pending the results of our tests

## 2017-01-15 LAB — LIPID PANEL
CHOL/HDL RATIO: 4.3 ratio (ref 0.0–5.0)
CHOLESTEROL TOTAL: 216 mg/dL — AB (ref 100–199)
HDL: 50 mg/dL (ref >39)
LDL Calculated: 149 mg/dL — ABNORMAL HIGH (ref 0–99)
TRIGLYCERIDES: 83 mg/dL (ref 0–149)
VLDL Cholesterol Cal: 17 mg/dL (ref 5–40)

## 2017-01-15 LAB — CMP AND LIVER
ALT: 16 IU/L (ref 0–44)
AST: 18 IU/L (ref 0–40)
Albumin: 4.6 g/dL (ref 3.5–5.5)
Alkaline Phosphatase: 69 IU/L (ref 39–117)
BILIRUBIN, DIRECT: 0.12 mg/dL (ref 0.00–0.40)
BUN: 13 mg/dL (ref 6–24)
Bilirubin Total: 0.4 mg/dL (ref 0.0–1.2)
CO2: 25 mmol/L (ref 20–29)
CREATININE: 0.83 mg/dL (ref 0.76–1.27)
Calcium: 9.7 mg/dL (ref 8.7–10.2)
Chloride: 101 mmol/L (ref 96–106)
GFR calc non Af Amer: 100 mL/min/{1.73_m2} (ref >59)
GFR, EST AFRICAN AMERICAN: 115 mL/min/{1.73_m2} (ref >59)
GLUCOSE: 146 mg/dL — AB (ref 65–99)
Potassium: 4.8 mmol/L (ref 3.5–5.2)
SODIUM: 141 mmol/L (ref 134–144)
Total Protein: 6.9 g/dL (ref 6.0–8.5)

## 2017-01-15 LAB — MICROALBUMIN / CREATININE URINE RATIO
CREATININE, UR: 14.7 mg/dL
MICROALB/CREAT RATIO: 817 mg/g{creat} — AB (ref 0.0–30.0)
MICROALBUM., U, RANDOM: 120.1 ug/mL

## 2017-01-15 LAB — HEMOGLOBIN A1C
Est. average glucose Bld gHb Est-mCnc: 180 mg/dL
Hgb A1c MFr Bld: 7.9 % — ABNORMAL HIGH (ref 4.8–5.6)

## 2017-01-15 NOTE — Progress Notes (Signed)
Subjective: Dylan Anderson is a 56 y.o. male presenting to establish general primary care. He has not seen a doctor since 2014 as he lost his insurance and could not afford to see a dr. Since. His only current complaints are that he is having back pain as well as scrotal pain. He states that his back pain has been present for several years and has not gotten any worse. It is a pain that "starts in the middle of his back" and wraps around the sides. He states that his scrotal pain started several months ago and that he has found a discrete mass in it. It is not causing him too much discomfort but it is just annoying.   Health Maintenance:  Patient needs just about every health maintenance item Health Maintenance Due  Topic Date Due  . Hepatitis C Screening  07/16/1960  . PNEUMOCOCCAL POLYSACCHARIDE VACCINE (1) 02/19/1964  . FOOT EXAM  02/19/1972  . OPHTHALMOLOGY EXAM  02/19/1972  . HIV Screening  02/18/1977  . TETANUS/TDAP  02/18/1981  . COLONOSCOPY  02/19/2012   - Review of Systems: Per HPI. All other systems reviewed and are negative. -  Past Medical/Surgical History: Patient Active Problem List   Diagnosis Date Noted  . Uncontrolled diabetes mellitus (HCC) 07/10/2012  . Hypertension 07/10/2012  . Tobacco abuse 07/10/2012  . Palpitations 07/10/2012  . Paresthesia of right arm 12/01/2011  . Periodic heart flutter 12/01/2011   Past Surgical History:  Procedure Laterality Date  . stent Left    left common and left internal illiac stent   - Family History:  Family History  Problem Relation Age of Onset  . Heart failure Mother   . Osteoarthritis Mother     - Medications: reviewed and updated None  - Allergies:  No Known Allergies  Objective: BP 120/78   Pulse 78   Temp 98.3 F (36.8 C) (Oral)   Ht  (1.778 m)   Wt 144 lb (65.3 kg)   SpO2 98%   BMI 20.66 kg/m  Gen: well-appearing 55 y.o.male in NAD HEENT: Normocephalic, sclerae/conjunctivae clear, PERRL, MMM,  posterior oropharynx clear, good dentition Neck: neck supple, no masses appreciated; thyroid not enlarged  Pulm: Non-labored; CTAB, no wheezes  CV: Regular rate, no murmur appreciated; distal pulses intact/symmetric; no LE edema GI: present BS; soft, non-tender, non-distended, no HSM, no ventral or inguinal hernia GU: Patient with discrete mass above left testicle, "rubbery" texture Lymph: No cervical, supraclavicular, or axillary lymphadenopathy Skin: No rashes, wounds, ulcers MSK: Normal gait and station; no digital clubbing/cyanosis, no frank joint deformity/effusion, full active ROM, no point muscle/bony tenderness in spine Neuro: CN II-XII without deficits, sensation intact to light touch, patellar DTRs 2+ bilaterally Psych: A&Ox3, mood pleasant with normal affect, intact recent and remote memory, good judgment, good insight, speech clear and coherent    Chemistry      Component Value Date/Time   NA 141 01/13/2017 1610   K 4.8 01/13/2017 1610   CL 101 01/13/2017 1610   CO2 25 01/13/2017 1610   BUN 13 01/13/2017 1610   CREATININE 0.83 01/13/2017 1610      Component Value Date/Time   CALCIUM 9.7 01/13/2017 1610   ALKPHOS 69 01/13/2017 1610   AST 18 01/13/2017 1610   ALT 16 01/13/2017 1610   BILITOT 0.4 01/13/2017 1610      Lab Results  Component Value Date   WBC 8.0 07/10/2012   HGB 14.9 07/10/2012   HCT 42.6 07/10/2012   MCV 83.5  07/10/2012   PLT 325 07/10/2012   Lab Results  Component Value Date   TSH 0.419 07/10/2012   Lab Results  Component Value Date   HGBA1C 7.9 (H) 01/13/2017   Lab Results  Component Value Date   CHOL 216 (H) 01/13/2017   HDL 50 01/13/2017   LDLCALC 149 (H) 01/13/2017   TRIG 83 01/13/2017   CHOLHDL 4.3 01/13/2017    Assessment/Plan: Dylan Anderson is a 56 y.o. male establishing care. Patient has complaints of lower back pain as well as a fluctuant mass above right testicle. His biggest challenge will remain his lack of insurance. Will  have him see jackie for financial planning. As he has not seen a doctor in several years will order some baseline labs. I believe his scrotal mass is a sebaceous cyst. It is clear distinct from his epidiymis and is not as painful as a suspected infection would be.  Healthcare maintenance:  Patient needs almost all of his health maintenance items performed. He will need financial assistance for some of these tests. As he has not had any labwork in several year will need to get baseline labs in order to better tailor his care. Will get cbc, cmp, ua, poct glucose. He will likely need a scrotal ultrasound at some point for his testicular problem. Will get UA as this is a cheap test that can provide some info in regards to his mass. Will have him follow up in 1-2 weeks once we have these results. Will likely start him on metformin and a statin at the next appointment unless he has an emergent value on his labwork. - cbc, bmp, ua, microalbumin - f/u in 1-2 weeks - meet with Ree Shay for financial assistance   FOLLOW UP: Follow up in 1-2 weeks for follow up of testing

## 2017-01-20 ENCOUNTER — Telehealth: Payer: Self-pay | Admitting: Family Medicine

## 2017-01-20 NOTE — Telephone Encounter (Signed)
Family Medicine Telephone Note  Called patient and informed him of results of cmp, a1c, glucose, and lipid panel. These results were all better than expected. We have some work to do in terms of his health. Will see him 10/25 for next visit. Changes to care will be detailed in that visit note.  Myrene BuddyJacob Taeler Winning MD PGY-1 Family Medicine Resident

## 2017-01-25 ENCOUNTER — Encounter: Payer: Self-pay | Admitting: Family Medicine

## 2017-01-25 DIAGNOSIS — E785 Hyperlipidemia, unspecified: Secondary | ICD-10-CM | POA: Insufficient documentation

## 2017-01-25 DIAGNOSIS — M545 Low back pain, unspecified: Secondary | ICD-10-CM | POA: Insufficient documentation

## 2017-01-25 DIAGNOSIS — N529 Male erectile dysfunction, unspecified: Secondary | ICD-10-CM | POA: Insufficient documentation

## 2017-01-25 HISTORY — DX: Hyperlipidemia, unspecified: E78.5

## 2017-01-25 HISTORY — DX: Low back pain, unspecified: M54.50

## 2017-01-26 ENCOUNTER — Ambulatory Visit: Payer: Self-pay | Admitting: Family Medicine

## 2017-02-02 ENCOUNTER — Ambulatory Visit (INDEPENDENT_AMBULATORY_CARE_PROVIDER_SITE_OTHER): Payer: Self-pay | Admitting: Family Medicine

## 2017-02-02 ENCOUNTER — Encounter: Payer: Self-pay | Admitting: Family Medicine

## 2017-02-02 VITALS — BP 130/70 | HR 72 | Temp 97.9°F | Ht 70.0 in | Wt 147.4 lb

## 2017-02-02 DIAGNOSIS — I10 Essential (primary) hypertension: Secondary | ICD-10-CM

## 2017-02-02 DIAGNOSIS — G4700 Insomnia, unspecified: Secondary | ICD-10-CM

## 2017-02-02 DIAGNOSIS — M545 Low back pain: Secondary | ICD-10-CM

## 2017-02-02 DIAGNOSIS — G8929 Other chronic pain: Secondary | ICD-10-CM

## 2017-02-02 DIAGNOSIS — E119 Type 2 diabetes mellitus without complications: Secondary | ICD-10-CM

## 2017-02-02 MED ORDER — ATORVASTATIN CALCIUM 40 MG PO TABS: 40.0000 mg | ORAL_TABLET | Freq: Every day | ORAL | 11 refills | Status: DC

## 2017-02-02 MED ORDER — METFORMIN HCL 500 MG PO TABS: 500.0000 mg | ORAL_TABLET | Freq: Every day | ORAL | 11 refills | Status: DC

## 2017-02-02 NOTE — Patient Instructions (Signed)
Today we discussed your back pain, sleeping trouble, as well as your results from your last appointment For your back pain, please continue to use heating pads, rest when you can, and tylenol and ibuprofen. You can take 600mg  of ibuprofen up to 4 times per day.  You can take 325mg  of acetaminophen 4 times per day For your trouble sleeping, please consider making activity changes and focusing on good sleep hygiene. IF you feel the urge to sleep at night, just get in the bed and turn the tv off. You can also consider getting 5mg  melatonin tablets over the counter Regarding your results from the last visit. I am please that your a1c is 7.9 and your cholesterol is manageable. We still have some work to do but keep up the good work. I am going to be starting you on metformin 500mg  daily with breakfast. You will come see me in 3 months for an A1c check. You are also going to be started on atorvastatin 40mg  daily for your cholesterol. We will redraw lipids in 1 year. Please follow up with me in 3-4 weeks to assess how these medications are going. We will also likely start you on a blood pressure medication at that visit.

## 2017-02-06 ENCOUNTER — Encounter: Payer: Self-pay | Admitting: Family Medicine

## 2017-02-06 DIAGNOSIS — G4700 Insomnia, unspecified: Secondary | ICD-10-CM

## 2017-02-06 HISTORY — DX: Insomnia, unspecified: G47.00

## 2017-02-06 NOTE — Assessment & Plan Note (Signed)
Patient with blood pressure of 130/90 at office. He does have some proteinuria. Low dose ace inhibitor is indicated in this patient however, will hold off on starting at that time. I am also starting metformin and atorvastatin at this time and would like to gauge his tolerance of these new medications before starting a third. Will plan to start on 5-10mg  lisinopril at next visit. - start lisinopril at next visit

## 2017-02-06 NOTE — Assessment & Plan Note (Signed)
Believe patient's pain to be musculoskeletal. Prescribed regimen of heating pads, tylenol 325mg  q 6 hours, and ibuprofen 600mg  q 6 hours. Asked patient to back down on ibuprofen if symptoms resolve. - heating pads - rest when able - tylenol 325 q 6 hours - ibuprofen 600mg  q 6 hours

## 2017-02-06 NOTE — Progress Notes (Signed)
HPI 56 year old male who was seen as a new patient in my clinic on 10/12. Patient had no pressing medical concerns at that time and wished to establish a pcp at the behest of his girlfriend. He has not seen a doctor in 4.5 year prior to that visit. Baseline labs were drawn including cmp, a1c, urine microalb, lipid panel. Patient's treatment is complicated somewhat by his lack of insurance. Will need to gather some form of financial assistance before health maintenence items such a colonoscopy can be arranged. He presents with no additional complaints. Please see my note from 10/12 for more information on his first visit. Patient had grossly normal cmp aside from glucose of 146. His A1c is 7.9 which is down from 8.2 4 years ago. His microalb/creat ratio was 817 which is significant for clinical albuminuria. Lipid panel showed cholesterol of 216 and LDL of 149. He had 30 of protein in his urine.  He presents with two complaints; one of back pain and one of insomnia. He states that his back pain started around 2 weeks ago when he moved an air compressor into his truck. It hurts in his right, lower back off center from his spine. He says that a combination of heating pads, tylenol, and ibuprofen have helped some. He has been taking 400-600mg  a day of ibuprofen and one 325mg  tablet of tylenol. The pain does not radiate, he has no symptoms consistent with sciatica. 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  Regarding his sleep he says that usually he arrives home from work fairly tired. He usually eats dinner and falls asleep on the cough for a couple of hours. He usually waked up around 1 or 2 am and cannot fall back to sleep. He usually had the tv on when he does this. He starts his day but usually winds up feeling pretty tired  In the late afternoon. He only drinks 2 cups of coffee per day and his last one is usually no later than 11am. On average he sleeps about  4-5 hours per day.  CC: follow up   ROS: Review of Systems  Constitutional: Negative for chills, fever and weight loss.  HENT: Negative for congestion and sinus pain.   Eyes: Negative for pain and discharge.  Respiratory: Negative for cough, hemoptysis and sputum production.   Cardiovascular: Negative for chest pain, palpitations, orthopnea and claudication.  Gastrointestinal: Negative for abdominal pain, constipation, diarrhea, heartburn, nausea and vomiting.  Genitourinary: Negative for dysuria and urgency.  Musculoskeletal: Positive for back pain. Negative for myalgias.  Neurological: Negative for dizziness and headaches.  Psychiatric/Behavioral: The patient has insomnia.      Review of Systems See HPI for ROS.   CC, SH/smoking status, and VS noted  Objective: BP 130/70   Pulse 72   Temp 97.9 F (36.6 C) (Oral)   Ht 5\' 10"  (1.778 m)   Wt 66.9 kg (147 lb 6.4 oz)   SpO2 98%   BMI 21.15 kg/m  Gen: well-appearing 55 y.o.male in NAD HEENT: Normocephalic, sclerae/conjunctivae clear, PERRL, MMM, posterior oropharynx clear, good dentition Neck: neck supple, no masses appreciated; thyroid not enlarged  Pulm: Non-labored; CTAB, no wheezes  CV: Regular rate, no murmur appreciated; distal pulses intact/symmetric; no LE edema GI: present BS; soft, non-tender, non-distended, no HSM, no ventral or inguinal hernia Lymph: No cervical, supraclavicular, or axillary lymphadenopathy Skin: No rashes, wounds, ulcers MSK: Normal gait and  station; no digital clubbing/cyanosis, no frank joint deformity/effusion, full active ROM, no point muscle/bony tenderness in spine Neuro: CN II-XII without deficits, sensation intact to light touch, patellar DTRs 2+ bilaterally Psych: A&Ox3, mood pleasant with normal affect, intact recent and remote memory, good judgment, good insight, speech clear and coherent Assessment and plan:  Insomnia Believe that patient's trouble sleeping is due to his poor  sleep hygiene. Advised him that once he feels tired or sleepy he should just get ready for bed and fall asleep at that time. Also advised for him to turn the tv off. Patient can buy over the counter melatonin gummies if he needs to. - melatonin gummies prn - practice good sleep hygiene  Hypertension Patient with blood pressure of 130/90 at office. He does have some proteinuria. Low dose ace inhibitor is indicated in this patient however, will hold off on starting at that time. I am also starting metformin and atorvastatin at this time and would like to gauge his tolerance of these new medications before starting a third. Will plan to start on 5-10mg  lisinopril at next visit. - start lisinopril at next visit  Diabetes mellitus type II, non insulin dependent (HCC) Patient with a1c of 7.9. While it is good that his A1C has not increased in the 4 years since he last saw a doctor, there is still some work to do regarding his diabetes management. Will start metformin 500mg  daily initially. Get A1c in 3 months. For patient's age his goal is  <7.0. Will adjust metformin if A1c still above this at next check. Given his diabets and proteinuria he will also be started on an ace inhibitor at next follow up appointment. - start metformin 500mg  daily - a1c in 3 months - lisinopril at next appointment  Low back pain Believe patient's pain to be musculoskeletal. Prescribed regimen of heating pads, tylenol 325mg  q 6 hours, and ibuprofen 600mg  q 6 hours. Asked patient to back down on ibuprofen if symptoms resolve. - heating pads - rest when able - tylenol 325 q 6 hours - ibuprofen 600mg  q 6 hours   No orders of the defined types were placed in this encounter.   Meds ordered this encounter  Medications  . metFORMIN (GLUCOPHAGE) 500 MG tablet    Sig: Take 1 tablet (500 mg total) by mouth daily with breakfast.    Dispense:  30 tablet    Refill:  11  . atorvastatin (LIPITOR) 40 MG tablet    Sig: Take 1  tablet (40 mg total) by mouth daily.    Dispense:  30 tablet    Refill:  11    Myrene BuddyJacob Hira Trent MD PGY-1 Family Medicine Resident 02/06/2017 10:39 PM

## 2017-02-06 NOTE — Assessment & Plan Note (Signed)
Believe that patient's trouble sleeping is due to his poor sleep hygiene. Advised him that once he feels tired or sleepy he should just get ready for bed and fall asleep at that time. Also advised for him to turn the tv off. Patient can buy over the counter melatonin gummies if he needs to. - melatonin gummies prn - practice good sleep hygiene

## 2017-02-06 NOTE — Assessment & Plan Note (Signed)
Patient with a1c of 7.9. While it is good that his A1C has not increased in the 4 years since he last saw a doctor, there is still some work to do regarding his diabetes management. Will start metformin 500mg  daily initially. Get A1c in 3 months. For patient's age his goal is  <7.0. Will adjust metformin if A1c still above this at next check. Given his diabets and proteinuria he will also be started on an ace inhibitor at next follow up appointment. - start metformin 500mg  daily - a1c in 3 months - lisinopril at next appointment

## 2017-03-02 ENCOUNTER — Ambulatory Visit: Payer: Self-pay | Admitting: Family Medicine

## 2017-03-10 ENCOUNTER — Other Ambulatory Visit: Payer: Self-pay

## 2017-03-10 ENCOUNTER — Ambulatory Visit (INDEPENDENT_AMBULATORY_CARE_PROVIDER_SITE_OTHER): Payer: Self-pay | Admitting: Family Medicine

## 2017-03-10 ENCOUNTER — Encounter: Payer: Self-pay | Admitting: Family Medicine

## 2017-03-10 VITALS — BP 132/68 | HR 86 | Temp 98.3°F | Ht 70.0 in | Wt 143.2 lb

## 2017-03-10 DIAGNOSIS — I10 Essential (primary) hypertension: Secondary | ICD-10-CM

## 2017-03-10 DIAGNOSIS — E785 Hyperlipidemia, unspecified: Secondary | ICD-10-CM

## 2017-03-10 DIAGNOSIS — M79642 Pain in left hand: Secondary | ICD-10-CM

## 2017-03-10 DIAGNOSIS — E119 Type 2 diabetes mellitus without complications: Secondary | ICD-10-CM

## 2017-03-10 MED ORDER — LISINOPRIL 10 MG PO TABS: 10.0000 mg | ORAL_TABLET | Freq: Every day | ORAL | 1 refills | Status: DC

## 2017-03-10 MED ORDER — LISINOPRIL 20 MG PO TABS: 20.0000 mg | ORAL_TABLET | Freq: Every day | ORAL | 2 refills | Status: DC

## 2017-03-10 NOTE — Patient Instructions (Addendum)
It was great seeing you again. Today we talked about your medications, your high blood pressure, your back pain, sleeping, and hand pain. I will be starting you on lisinopril to help with your blood pressure. Regarding your back pain, I believe you can go up some on your ibuprofen dosing. You can take ibuprofen 4 times per day for the next 1-2 weeks. You can also take tylenol 325mg  around 4 times per day.  Regarding your hand pain. I am not sure what is causing your pain without an xray. I would like to defer getting one of those as this will cost around $100 and might not affect my management. I have attached some stretches to help with your hand range of motion. We will re-evaluate the pain in one month when you see me for your diabetes. If the symptoms get worse please schedule another appointment to see me.

## 2017-03-18 DIAGNOSIS — M79643 Pain in unspecified hand: Secondary | ICD-10-CM | POA: Insufficient documentation

## 2017-03-18 HISTORY — DX: Pain in unspecified hand: M79.643

## 2017-03-18 NOTE — Progress Notes (Signed)
HPI 56 year old male who presents for medication follow up. He also has new complaint of his left hand hurting.  He states that he was moving a washing machine around 2 weeks prior to his visit and it fell on his hand. He initially developed some pain and swelling over his fourth finger on his left hand. The swelling subsided but he has had trouble closing his 3rd and 4th digit into a full fist on that side. He has tried ibuprofen for relief, 200mg  once or twice per day. This has provided limited benefit. The pain has mostly subsided and he only has some point tenderness on the lateral aspect of the 4th digit.  Otherwise the patient is doing very well. He states that he is actually feeling better since he started his metformin. He has not muscle aches or any common side effects from the metformin or atorvastatin. He will need an A1C check in 1 month and a cholesterol check in 8 months.  CC: "Hand Hurting"   ROS: Review of Systems  Constitutional: Negative for chills and fever.  Respiratory: Negative for cough and hemoptysis.   Cardiovascular: Negative for chest pain and palpitations.  Gastrointestinal: Negative for constipation, diarrhea, nausea and vomiting.  Genitourinary: Negative for dysuria and urgency.  Musculoskeletal: Negative for joint pain, myalgias and neck pain.  Skin: Negative for itching and rash.  Neurological: Negative for dizziness, tremors and headaches.  Psychiatric/Behavioral: Negative for depression and suicidal ideas.    Review of Systems See HPI for ROS.   CC, SH/smoking status, and VS noted  Objective: BP 132/68   Pulse 86   Temp 98.3 F (36.8 C) (Oral)   Ht 5\' 10"  (1.778 m)   Wt 143 lb 3.2 oz (65 kg)   SpO2 98%   BMI 20.55 kg/m  Gen: NAD, alert, cooperative, and pleasant. HEENT: NCAT, EOMI, PERRL CV: RRR, no murmur Resp: CTAB, no wheezes, non-labored Abd: SNTND, BS present, no guarding or organomegaly Ext: No edema, warm Neuro: Alert and  oriented, Speech clear, No gross deficits Left Hand: tenderness to palpation lateral and medial aspect of 4th metacarpal. Able to flex 3rd and 4th digit but unable to close.all the way. Able to passively force into fist shape.   Assessment and plan:  Hand pain Given history of trauma to the area the differential includes bony or soft tissue abnormality. It is unlikely to be ligamentous tear given that he can still flex. Most likely he has local swelling causing him to have painful limitation of movement. Will opt to have patient increase his tylenol dose and ibuprofen dose and follow up in 2 weeks if his hand is not any better. Will get xray at that time. - ibuprofen 200mg  4 times per day for next 2 weeks prn - tylenol 3325mg  4 times per day for next 2 weeks prn - consider hand xray if doesn't improve  Hyperlipidemia Continue atorvastatin, no side effects noted. Lipid panel one year after last check  Hypertension Starting lisinipril 10mg  at this visit. Will get bmp at next follow up appointment.  Diabetes mellitus type II, non insulin dependent (HCC) Continue metformin. Lisinopril started at today's visit for renal and eye protection. A1c Check in one month.   No orders of the defined types were placed in this encounter.   Meds ordered this encounter  Medications  . DISCONTD: lisinopril (PRINIVIL,ZESTRIL) 20 MG tablet    Sig: Take 1 tablet (20 mg total) by mouth daily.    Dispense:  90 tablet    Refill:  2  . lisinopril (PRINIVIL,ZESTRIL) 10 MG tablet    Sig: Take 1 tablet (10 mg total) by mouth daily.    Dispense:  90 tablet    Refill:  1     Myrene BuddyJacob Cleveland Yarbro MD PGY-1 Family Medicine Resident 03/18/2017 12:18 PM

## 2017-03-18 NOTE — Assessment & Plan Note (Signed)
Continue atorvastatin, no side effects noted. Lipid panel one year after last check

## 2017-03-18 NOTE — Assessment & Plan Note (Signed)
Starting lisinipril 10mg  at this visit. Will get bmp at next follow up appointment.

## 2017-03-18 NOTE — Assessment & Plan Note (Signed)
Given history of trauma to the area the differential includes bony or soft tissue abnormality. It is unlikely to be ligamentous tear given that he can still flex. Most likely he has local swelling causing him to have painful limitation of movement. Will opt to have patient increase his tylenol dose and ibuprofen dose and follow up in 2 weeks if his hand is not any better. Will get xray at that time. - ibuprofen 200mg  4 times per day for next 2 weeks prn - tylenol 3325mg  4 times per day for next 2 weeks prn - consider hand xray if doesn't improve

## 2017-03-18 NOTE — Assessment & Plan Note (Signed)
Continue metformin. Lisinopril started at today's visit for renal and eye protection. A1c Check in one month.

## 2017-03-20 ENCOUNTER — Telehealth: Payer: Self-pay | Admitting: *Deleted

## 2017-03-20 NOTE — Telephone Encounter (Signed)
Pt fiance lm on nurse line.  States that since he started the new medication, metformin, his sugars are "slowly going up".  Reports that his CBG this am was 363.  Will forward to MD. Erinn Huskins, Maryjo RochesterJessica Dawn, CMA

## 2017-03-21 NOTE — Telephone Encounter (Signed)
Called to discuss blood glucose management with patient. He has been taking his sugars immediately after breakfast. Asked him to start checking his sugar before breakfast everyday as what he eats for breakfast may affect his numbers. He also says that he has been feeling a little different on the lisinopril but this feeling is going away. Asked him to make the changes to his sugars checks and also give the lisinopril a try for the next 2-3 days. If he is still having difficulty I asked for him to let me know.  Myrene BuddyJacob Glen Blatchley MD PGY-1 Family Medicine Resident

## 2017-04-07 ENCOUNTER — Ambulatory Visit: Payer: Self-pay | Admitting: Family Medicine

## 2017-04-20 ENCOUNTER — Encounter: Payer: Self-pay | Admitting: Family Medicine

## 2017-04-20 ENCOUNTER — Other Ambulatory Visit: Payer: Self-pay

## 2017-04-20 ENCOUNTER — Ambulatory Visit (INDEPENDENT_AMBULATORY_CARE_PROVIDER_SITE_OTHER): Payer: Self-pay | Admitting: Family Medicine

## 2017-04-20 VITALS — BP 122/82 | HR 73 | Temp 98.3°F | Ht 70.0 in | Wt 145.0 lb

## 2017-04-20 DIAGNOSIS — E119 Type 2 diabetes mellitus without complications: Secondary | ICD-10-CM

## 2017-04-20 DIAGNOSIS — M79642 Pain in left hand: Secondary | ICD-10-CM

## 2017-04-20 DIAGNOSIS — K219 Gastro-esophageal reflux disease without esophagitis: Secondary | ICD-10-CM

## 2017-04-20 LAB — POCT GLYCOSYLATED HEMOGLOBIN (HGB A1C): HEMOGLOBIN A1C: 8.5

## 2017-04-20 MED ORDER — PANTOPRAZOLE SODIUM 20 MG PO TBEC: 20.0000 mg | DELAYED_RELEASE_TABLET | Freq: Every day | ORAL | 8 refills | Status: DC

## 2017-04-20 MED ORDER — METFORMIN HCL 500 MG PO TABS: 500.0000 mg | ORAL_TABLET | Freq: Two times a day (BID) | ORAL | 11 refills | Status: DC

## 2017-04-20 NOTE — Patient Instructions (Addendum)
It was great seeing you today. We discussed your diabetes. Your A1C went from 7.9 to 8.5. I believe this is due to a number of factors including slight laxity in your diet from the holidays, being less active due to the weather, and perhaps other factors. Please take your metformin 500mg  two times per ay (one at breakfast and at night). I am glad that your regimen of tylenol and ibuprofen has improved your back pain.  Regarding your hand pain, I am concerned and would like to get imaging to better characterize this. I will put in an order for an xray. You can get a discounted xray at the hospital. Please go to the front desk and say that you need a hand xray, and you will be lead in the direction of the radiology department. I will call you with these results when they become available.  I also gave you paperwork for an orange card. Please fill this out at your convenience.

## 2017-04-27 ENCOUNTER — Encounter: Payer: Self-pay | Admitting: Family Medicine

## 2017-04-27 DIAGNOSIS — K219 Gastro-esophageal reflux disease without esophagitis: Secondary | ICD-10-CM

## 2017-04-27 HISTORY — DX: Gastro-esophageal reflux disease without esophagitis: K21.9

## 2017-04-27 NOTE — Assessment & Plan Note (Signed)
Given that the hand pain has not improved will proceed with hand xray. Gave instructions for getting this xray done. Will call patient with results and further management when this is performed.

## 2017-04-27 NOTE — Assessment & Plan Note (Signed)
Patient with s/s of GERD. Has been taking wife's protonix with relief. Will start him on 20mg  tablet and increase as needed.

## 2017-04-27 NOTE — Progress Notes (Signed)
HPI Patient presents as a follow up for an A1C check for continued diabetes management. Patient states that his sugars have persistently been in the low 200s. A1C check backs this up as it has increased from 7.9 to 8.5 since his check back in October 2018. 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 He has been having no related symptoms.   The patient also presents for follow up regarding his left hand ROM limitation. He states that this has not improved recently on the tylenol and ibuprofen regimen that he was prescribed previously. He still has limitation of finger flexion, but it is still able to be passively flexed to fist. Essentially he has not changed since he was seen for this problem at last visit.  Patient also interested in financial assistance. He was given orange card form to fill out.  CC: A1C check, hand swelling   ROS: Review of Systems  Constitutional: Negative for chills and fever.  Eyes: Negative for pain.  Respiratory: Negative for cough and hemoptysis.   Cardiovascular: Negative for chest pain and palpitations.  Gastrointestinal: Negative for abdominal pain, diarrhea, nausea and vomiting.  Genitourinary: Negative for dysuria and urgency.  Musculoskeletal: Negative for joint pain and myalgias.  Psychiatric/Behavioral: Negative for depression and suicidal ideas.    Review of Systems See HPI for ROS.   CC, SH/smoking status, and VS noted  Objective: BP 122/82   Pulse 73   Temp 98.3 F (36.8 C) (Oral)   Ht 5\' 10"  (1.778 m)   Wt 145 lb (65.8 kg)   SpO2 99%   BMI 20.81 kg/m  Gen: NAD, alert, cooperative, and pleasant. HEENT: NCAT, EOMI, PERRL CV: RRR, no murmur Resp: CTAB, no wheezes, non-labored Abd: SNTND, BS present, no guarding or organomegaly Ext: No edema, warm Neuro: Alert and oriented, Speech clear, No gross deficits Left  Hand: tenderness to palpation lateral and medial aspect of 4th metacarpal. Able to flex 3rd and 4th digit but unable to close.all the way. Able to passively force into fist shape.  Assessment and plan:  Diabetes mellitus type II, non insulin dependent (HCC) Patient with elevated a1c since last check from 7.8->8.5. Likely 2/2 worsening diet due to holidays and decreased activity due to the weather. Will increase to 500mg  bid of metformin, recheck in 3 months. Counseled on appropriate diet and importance of exercise. - increase metformin to 500mg  bid - counseling on diet and exercise - recheck a1c in 3 months  Hand pain Given that the hand pain has not improved will proceed with hand xray. Gave instructions for getting this xray done. Will call patient with results and further management when this is performed.  Gastroesophageal reflux disease Patient with s/s of GERD. Has been taking wife's protonix with relief. Will start him on 20mg  tablet and increase as needed.   Orders Placed This Encounter  Procedures  . DG Hand Complete Left    Standing Status:   Future    Standing Expiration Date:   06/19/2018    Order Specific Question:   Reason for Exam (SYMPTOM  OR DIAGNOSIS REQUIRED)    Answer:   limitation with hand strength after trauma to hand    Order Specific Question:   Preferred imaging location?    Answer:   [facility]    Order Specific Question:   Radiology Contrast  Protocol - do NOT remove file path    Answer:   [email or web address]  . POCT glycosylated hemoglobin (Hb A1C)    Meds ordered this encounter  Medications  . metFORMIN (GLUCOPHAGE) 500 MG tablet    Sig: Take 1 tablet (500 mg total) by mouth 2 (two) times daily with a meal.    Dispense:  30 tablet    Refill:  11  . pantoprazole (PROTONIX) 20 MG tablet    Sig: Take 1 tablet (20 mg total) by mouth daily.    Dispense:  30 tablet    Refill:  8    Myrene Buddy  MD PGY-1 Family Medicine Resident 04/27/2017 8:32 AM

## 2017-04-27 NOTE — Assessment & Plan Note (Signed)
Patient with elevated a1c since last check from 7.8->8.5. Likely 2/2 worsening diet due to holidays and decreased activity due to the weather. Will increase to 500mg  bid of metformin, recheck in 3 months. Counseled on appropriate diet and importance of exercise. - increase metformin to 500mg  bid - counseling on diet and exercise - recheck a1c in 3 months

## 2017-05-15 ENCOUNTER — Encounter: Payer: Self-pay | Admitting: Internal Medicine

## 2017-05-15 ENCOUNTER — Ambulatory Visit (INDEPENDENT_AMBULATORY_CARE_PROVIDER_SITE_OTHER): Payer: Self-pay | Admitting: Internal Medicine

## 2017-05-15 ENCOUNTER — Other Ambulatory Visit: Payer: Self-pay

## 2017-05-15 DIAGNOSIS — M25552 Pain in left hip: Secondary | ICD-10-CM

## 2017-05-15 DIAGNOSIS — M25551 Pain in right hip: Secondary | ICD-10-CM

## 2017-05-15 NOTE — Patient Instructions (Signed)
Mr. Dylan Anderson,  I recommend starting a daily baby aspirin to reduce risk of heart attack and stroke.  If your hip pain worsens, please call if you would like an xray order.  If the bulge returns or you notice a new painful swelling, please return.  Best, Dr. Sampson GoonFitzgerald

## 2017-05-15 NOTE — Progress Notes (Signed)
[facility] Progress Note  Subjective:  Dylan Anderson is a 57 y.o. male with history of tobacco abuse, T2DM, HTN, HLD, PVD s/p insertion of iliac artery stent 06/06/2012 left common iliac. Reports history of bilateral inguinal hernia repair as a child. He presents for concern about knot in left groin. He noticed this 1-2 weeks ago and has also had some intermittent sharp pains that radiate from his hip to anterior thigh. Pains seem to occur when walking. No recent falls, no worsening claudication symptoms. His girlfriend encouraged him to seek evaluation due to concern that the knot could represent malfunction of stent. Has been having regular BMs. No rash.   No Known Allergies  Social History   Tobacco Use  . Smoking status: Current Some Day Smoker    Packs/day: 0.50    Years: 30.00    Pack years: 15.00    Types: Cigarettes  . Smokeless tobacco: Never Used  Substance Use Topics  . Alcohol use: Yes    Objective: Blood pressure 128/68, pulse 65, temperature 98.2 F (36.8 C), temperature source Oral, height 5\' 10"  (1.778 m), weight 147 lb 9.6 oz (67 kg), SpO2 98 %. Body mass index is 21.18 kg/m. Constitutional: Thin male in NAD, pleasant Cardiovascular: RRR, S1, S2, no m/r/g.  Pulmonary/Chest: Effort normal and breath sounds normal.  Abdominal: Soft. +BS, NT, no hernias Musculoskeletal: No bulging noted over thighs. Discomfort bilaterally with internal rotation of hip. No pain with palpation of bilateral greater trochanters.  Neurological: AOx3, no focal deficits. Skin: Skin is warm and dry. No rash noted. Smooth without bulge in skin in seated (position in which he'd seen bulge before) or supine positions.  Psychiatric: Normal mood and affect.  Vitals reviewed  Assessment/Plan: Hip pain - Patient with likely arthritis given discomfort with internal rotation. Pain greater on left compared to right. No evidence of hernia or bulge that he had seen previously to suggest  hernia of malfunction of stent.  - Will defer xray of hips at this time, as patient would not consider surgery at this point. However, he would like imaging if pain worsens. - Advised to return if he noticing area of bulging again; could also take a picture to bring with him - Counseled to start taking a baby aspirin along with his statin given his risk factors of tobacco abuse, HTN, D2DM and PVD  Follow-up prn.  Dani GobbleHillary Perlie Stene, MD [facility], PGY-3

## 2017-05-20 ENCOUNTER — Encounter: Payer: Self-pay | Admitting: Internal Medicine

## 2017-05-20 DIAGNOSIS — M25559 Pain in unspecified hip: Secondary | ICD-10-CM | POA: Insufficient documentation

## 2017-05-20 HISTORY — DX: Pain in unspecified hip: M25.559

## 2017-05-20 MED ORDER — ASPIRIN EC 81 MG PO TBEC: 81.0000 mg | DELAYED_RELEASE_TABLET | Freq: Every day | ORAL | Status: DC

## 2017-05-20 NOTE — Assessment & Plan Note (Signed)
-   Patient with likely arthritis given discomfort with internal rotation. Pain greater on left compared to right. No evidence of hernia or bulge that he had seen previously to suggest hernia of malfunction of stent.  - Will defer xray of hips at this time, as patient would not consider surgery at this point. However, he would like imaging if pain worsens. - Advised to return if he noticing area of bulging again; could also take a picture to bring with him - Counseled to start taking a baby aspirin along with his statin given his risk factors of tobacco abuse, HTN, D2DM and PVD

## 2017-06-06 ENCOUNTER — Ambulatory Visit (INDEPENDENT_AMBULATORY_CARE_PROVIDER_SITE_OTHER): Payer: Self-pay | Admitting: Family Medicine

## 2017-06-06 VITALS — BP 122/80 | HR 86 | Temp 98.1°F | Ht 70.0 in | Wt 150.0 lb

## 2017-06-06 DIAGNOSIS — M79642 Pain in left hand: Secondary | ICD-10-CM

## 2017-06-06 DIAGNOSIS — I10 Essential (primary) hypertension: Secondary | ICD-10-CM

## 2017-06-06 DIAGNOSIS — I739 Peripheral vascular disease, unspecified: Secondary | ICD-10-CM

## 2017-06-06 NOTE — Patient Instructions (Addendum)
It was great seeing you again Mr. Dylan Anderson. I believe that your increased tiredness in your legs, pain in your legs, decreased sensation in your legs, and upper leg findings are consistent with a problem with you previously placed stent. I will make a referral to vascular surgery. You voiced that you would like to [facility] so I will place the referral to our vascular team in [place] as opposed to [place] where you had your initial stent placed.  I am pleased that you hand mobility has improved. Regarding your "bumps" I feel as though they were swollen lymph nodes from your prior viral illness.  I would like to see you back in 1 month for an a1c check. Your blood pressure looks great today. No medication changes were made.

## 2017-06-07 DIAGNOSIS — I739 Peripheral vascular disease, unspecified: Secondary | ICD-10-CM | POA: Insufficient documentation

## 2017-06-07 HISTORY — DX: Peripheral vascular disease, unspecified: I73.9

## 2017-06-07 NOTE — Assessment & Plan Note (Signed)
High suspicion that patient has either re-narrowed area related to stent or has narrowed other area in left common illiac. On exam patient does have pulses in BLE but with such a proximal stenosis he has likely developed decent collaterals for his peripheral pulses. Would like to send for ABI and doppler ultrasound with toe pressures but patient would prefer to have all workup done by vascular "so things dont fall through the cracks". Will place referral to vascular surgery. Would normally refer back to original surgeon, but patient now [place].

## 2017-06-07 NOTE — Assessment & Plan Note (Signed)
Very well controlled. 120s/80s. Will continue lisinopril 10mg .

## 2017-06-07 NOTE — Progress Notes (Signed)
HPI  57 year old male who presents due to increasing symptoms of numbness and pain in Bilateral lower extremities L>R. He first noticed these symptoms around 1.5 months ago.  He describes symptoms of only being about to walk about 100 yards before his "legs go to sleep" and he has to stop walking and take a break. He has not described any pain in his legs but does remark that they occasionally feel very cold. Of note he states that these symptoms are very similar to what he was experiencing prior to having a Left common iliac stent placement almost exactly 5 years ago. He states that at the time he was told that he would likely need one placed in his RLE as well but he never went to have that leg evaluated.  Otherwise he states that his range of motion has been slowly improving in his hand. He never went to have his xray performed. He was concerned about the development of "bumps" in his bilateral neck. This shortly proceeded a viral upper respiratory infection.  CC: left leg pain   ROS: Review of Systems  Constitutional: Negative for chills and fever.  Respiratory: Negative for cough, sputum production and shortness of breath.   Cardiovascular: Negative for chest pain, palpitations and leg swelling.  Gastrointestinal: Negative for abdominal pain, constipation, diarrhea, nausea and vomiting.  Musculoskeletal:       Numbness in legs with walking Legs feel "cold" and numb at night    Review of Systems See HPI for ROS.   CC, SH/smoking status, and VS noted  Objective: BP 122/80   Pulse 86   Temp 98.1 F (36.7 C)   Ht 5\' 10"  (1.778 m)   Wt 150 lb (68 kg)   SpO2 98%   BMI 21.52 kg/m  Gen: NAD, alert, cooperative, and pleasant. Thin caucasian male HEENT: NCAT, EOMI, PERRL CV: RRR, no murmur Resp: CTAB, no wheezes, non-labored Abd: SNTND, BS present, no guarding or organomegaly Ext: No edema, warm Neuro: Alert and oriented, Speech clear, No gross deficits LLE: Cap refill 2-3  seconds, skin warm, barely palpable Posterior tibialis, easily palpable Dorsalis Pedis RLE: Cap refill <2 seconds, skin warm, palpable Posterior tibialis, easily palpable Dorsalis Pedis  Assessment and plan:  Peripheral artery disease (HCC) High suspicion that patient has either re-narrowed area related to stent or has narrowed other area in left common illiac. On exam patient does have pulses in BLE but with such a proximal stenosis he has likely developed decent collaterals for his peripheral pulses. Would like to send for ABI and doppler ultrasound with toe pressures but patient would prefer to have all workup done by vascular "so things dont fall through the cracks". Will place referral to vascular surgery. Would normally refer back to original surgeon, but patient now [place] in [place].  Hand pain Seems to be resolving. Patient never had xray performed. Will continue to monitor during subsequent visits.  Hypertension Very well controlled. 120s/80s. Will continue lisinopril 10mg .   Orders Placed This Encounter  Procedures  . Ambulatory referral to Vascular Surgery    Referral Priority:   Routine    Referral Type:   Surgical    Referral Reason:   Specialty Services Required    Requested Specialty:   Vascular Surgery    Number of Visits Requested:   1    No orders of the defined types were placed in this encounter.   Myrene BuddyJacob Caree Wolpert MD PGY-1 Family Medicine Resident 06/07/2017  3:49 PM

## 2017-06-07 NOTE — Assessment & Plan Note (Signed)
Seems to be resolving. Patient never had xray performed. Will continue to monitor during subsequent visits.

## 2017-07-12 ENCOUNTER — Ambulatory Visit (INDEPENDENT_AMBULATORY_CARE_PROVIDER_SITE_OTHER): Payer: Self-pay | Admitting: Family Medicine

## 2017-07-12 ENCOUNTER — Other Ambulatory Visit: Payer: Self-pay

## 2017-07-12 ENCOUNTER — Encounter: Payer: Self-pay | Admitting: Family Medicine

## 2017-07-12 VITALS — BP 152/80 | HR 69 | Temp 98.3°F | Ht 70.0 in | Wt 145.0 lb

## 2017-07-12 DIAGNOSIS — R053 Chronic cough: Secondary | ICD-10-CM

## 2017-07-12 DIAGNOSIS — Z0000 Encounter for general adult medical examination without abnormal findings: Secondary | ICD-10-CM | POA: Insufficient documentation

## 2017-07-12 DIAGNOSIS — E119 Type 2 diabetes mellitus without complications: Secondary | ICD-10-CM

## 2017-07-12 DIAGNOSIS — I739 Peripheral vascular disease, unspecified: Secondary | ICD-10-CM

## 2017-07-12 DIAGNOSIS — R05 Cough: Secondary | ICD-10-CM

## 2017-07-12 HISTORY — DX: Encounter for general adult medical examination without abnormal findings: Z00.00

## 2017-07-12 LAB — POCT GLYCOSYLATED HEMOGLOBIN (HGB A1C): Hemoglobin A1C: 8.1

## 2017-07-12 NOTE — Progress Notes (Signed)
d 

## 2017-07-12 NOTE — Patient Instructions (Addendum)
It was great seeing you again today! I am glad that things have been doing well with your diabetes management. Your A1C is 8.1 today from 8.5 at the last appointment. In regards to your concern about prostate cancer. While I have low suspicion that you have this, we typically do not do prostate exams anymore. I will draw a lab called a PSA which is more sensitive. I will call you with these results. Please keep your follow up appointment at your vascular surgeon in early may. In regards to the colonoscopy. It will be much cheaper to get medicaid and then have the colonoscopy done. I cannot stress enough that getting medicaid will make things a lot easier. I have attached 3 offices around town. If you would like to have this done prior to medicaid please call these offices and see what they can do for you.   Here are 3 Gastroenterology office you can call to inquire about a colonoscopy:  [facility] Address: [address], [place], [place] [address] Phone: [phone]  [facility] Address: [address], [place], [place] [address] Phone: [phone]  [facility] Address: [address], [place], [place] [address] Phone: [phone]

## 2017-07-13 ENCOUNTER — Encounter: Payer: Self-pay | Admitting: Family Medicine

## 2017-07-13 DIAGNOSIS — R053 Chronic cough: Secondary | ICD-10-CM

## 2017-07-13 DIAGNOSIS — R05 Cough: Secondary | ICD-10-CM | POA: Insufficient documentation

## 2017-07-13 HISTORY — DX: Chronic cough: R05.3

## 2017-07-13 LAB — PSA: Prostate Specific Ag, Serum: 0.9 ng/mL (ref 0.0–4.0)

## 2017-07-13 NOTE — Progress Notes (Signed)
HPI 57 year old male who presents for a1c check as well as concerns about a prostate check. Patient a1c has improved from 8.5 to 8.1. Patient has been doing pretty well with his diet and activity per his report. Has been very inconsistent with keep track of his pressures, but did bring in a glucose meter. He hasnt updated it in a few weeks but sugars were between 141-300.  He is also worried because he has never had a prostate exam. Informed patient that the prostate exam is not indicated for prostate cancer screening anymore. Offered him PSA which he accepted.  Patient has been having very similar symptoms of claudication. He has an appointment with vascular surgery in early may. He states his symptoms are about the same but perhaps a little worse than last check.  Patient is interested in a colonscopy. Explained that he can try and get a colonoscopy and pay out of pocket or he can try and get medicaid based on income and get the colonoscopy after that. He opted to try and investigate for himself. Gave him list of offices around [place] and he will compare prices. Stressed that he really needs to apply for medicaid as this will help ease the financial burden of his medical care. Patient in agreement..   Last concern is that patient states he has a chronic cough. He will occasionally wheeze at night. He is interested in getting PFTs to evaluate for copd. He has been a 30+ year smoker. Has a very strong family history of COPD.  CC: a1c check   ROS:   Review of Systems See HPI for ROS.   CC, SH/smoking status, and VS noted  Objective: BP (!) 152/80   Pulse 69   Temp 98.3 F (36.8 C) (Oral)   Ht 5\' 10"  (1.778 m)   Wt 65.8 kg (145 lb)   SpO2 98%   BMI 20.81 kg/m  Gen: NAD, alert, cooperative, and pleasant. Thin caucasian male HEENT: NCAT, EOMI, PERRL CV: RRR, no murmur Resp: CTAB, no wheezes, non-labored Abd: SNTND, BS present, no guarding or organomegaly Ext: No edema,  warm Neuro: Alert and oriented, Speech clear, No gross deficits LLE: Cap refill 2-3 seconds, skin warm, palpable Posterior tibialis, easily palpable Dorsalis Pedis RLE: Cap refill <2 seconds, skin warm, palpable Posterior tibialis, palpable Dorsalis Pedis  Assessment and plan:  Peripheral artery disease (HCC) Still having similar symptoms of claudication. Harder to find pulses at this visit, but still present. Cap refill still <2 in RL, but a little delayed in LLE. Again offered patient u/s but wishes to have work up done with vascular. Good that he has appointment with them in early may. Still concern that his L common illiac stent has high grade stenosis. - keep appointment with vascular surgery  Diabetes mellitus type II, non insulin dependent (HCC) Doing well on current regimen. Will continue metformin as prescribed currently. If fails to further improve or gets worse at next check will consider increasing dosage of metformin. - continue metformin 500mg  bid  Healthcare maintenance Obtained PSA. Reviewed, 0.9. No further workup indicated. While I agree that patient needs colonoscopy, I encouraged him to pursue medicaid/orange card to help with cost. Patient to investigate on his own regarding colonoscopy and is in agreeement that he will apply for medicaid.  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 Given his family history, smoking, and likely environmental exposures to bronchotoxic agents he is a very strong candidate to have COPD now or in the future. Counseled him on smoking cessation. While we both agree that this is likely the best thing he can do for his health, his lack of insurance makes pharmacologic methods of cessation challenging.  - will refer to Dr. Raymondo Band for PFTs - continued smoking cessation counseling at future  visits   Orders Placed This Encounter  Procedures  . PSA  . POCT glycosylated hemoglobin (Hb A1C)    No orders of the defined types were placed in this encounter.    Myrene Buddy MD PGY-1 Family Medicine Resident  07/13/2017 10:16 AM

## 2017-07-13 NOTE — Assessment & Plan Note (Signed)
Obtained PSA. Reviewed, 0.9. No further workup indicated. While I agree that patient needs colonoscopy, I encouraged him to pursue medicaid/orange card to help with cost. Patient to investigate on his own regarding colonoscopy and is in agreeement that he will apply for medicaid.

## 2017-07-13 NOTE — Assessment & Plan Note (Signed)
Still having similar symptoms of claudication. Harder to find pulses at this visit, but still present. Cap refill still <2 in RL, but a little delayed in LLE. Again offered patient u/s but wishes to have work up done with vascular. Good that he has appointment with them in early may. Still concern that his L common illiac stent has high grade stenosis. - keep appointment with vascular surgery

## 2017-07-13 NOTE — Assessment & Plan Note (Signed)
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 Given his family history, smoking, and likely environmental exposures to bronchotoxic agents he is a very strong candidate to have COPD now or in the future. Counseled him on smoking cessation. While we both agree that this is likely the best thing he can do for his health, his lack of insurance makes pharmacologic methods of cessation challenging.  - will refer to Dr. Raymondo BandKoval for PFTs - continued smoking cessation counseling at future visits

## 2017-07-13 NOTE — Assessment & Plan Note (Signed)
Doing well on current regimen. Will continue metformin as prescribed currently. If fails to further improve or gets worse at next check will consider increasing dosage of metformin. - continue metformin 500mg  bid

## 2017-07-17 ENCOUNTER — Other Ambulatory Visit: Payer: Self-pay

## 2017-07-17 DIAGNOSIS — Z95828 Presence of other vascular implants and grafts: Secondary | ICD-10-CM

## 2017-07-17 DIAGNOSIS — I739 Peripheral vascular disease, unspecified: Secondary | ICD-10-CM

## 2017-07-20 ENCOUNTER — Ambulatory Visit: Payer: Self-pay | Admitting: Pharmacist

## 2017-08-03 ENCOUNTER — Ambulatory Visit (HOSPITAL_COMMUNITY)
Admission: RE | Admit: 2017-08-03 | Discharge: 2017-08-03 | Disposition: A | Discharge status: Home / Self Care | Payer: Self-pay | Source: Ambulatory Visit | Attending: Vascular Surgery | Admitting: Vascular Surgery

## 2017-08-03 ENCOUNTER — Other Ambulatory Visit: Payer: Self-pay

## 2017-08-03 ENCOUNTER — Encounter: Payer: Self-pay | Admitting: Pharmacist

## 2017-08-03 ENCOUNTER — Ambulatory Visit (INDEPENDENT_AMBULATORY_CARE_PROVIDER_SITE_OTHER): Payer: Self-pay | Admitting: Vascular Surgery

## 2017-08-03 ENCOUNTER — Ambulatory Visit: Payer: Self-pay | Admitting: Pharmacist

## 2017-08-03 ENCOUNTER — Encounter: Payer: Self-pay | Admitting: Vascular Surgery

## 2017-08-03 VITALS — BP 166/88 | HR 73 | Temp 97.8°F | Resp 18 | Ht 70.0 in | Wt 147.0 lb

## 2017-08-03 DIAGNOSIS — I739 Peripheral vascular disease, unspecified: Secondary | ICD-10-CM

## 2017-08-03 DIAGNOSIS — R053 Chronic cough: Secondary | ICD-10-CM

## 2017-08-03 DIAGNOSIS — I708 Atherosclerosis of other arteries: Secondary | ICD-10-CM | POA: Insufficient documentation

## 2017-08-03 DIAGNOSIS — Z95828 Presence of other vascular implants and grafts: Secondary | ICD-10-CM

## 2017-08-03 DIAGNOSIS — Z720 Tobacco use: Secondary | ICD-10-CM

## 2017-08-03 DIAGNOSIS — E119 Type 2 diabetes mellitus without complications: Secondary | ICD-10-CM

## 2017-08-03 DIAGNOSIS — R05 Cough: Secondary | ICD-10-CM

## 2017-08-03 MED ORDER — METFORMIN HCL 500 MG PO TABS: 1000.0000 mg | ORAL_TABLET | Freq: Two times a day (BID) | ORAL | 11 refills | Status: DC

## 2017-08-03 NOTE — Patient Instructions (Signed)
Thank you for coming today!  Your lung function tests were near normal.   1. START Nicotine  patches. Apply daily and rotate application sites.   2. INCREASE metformin to . Take 2 tablets by mouth once daily.  3. Follow-up at pharmacy clinic in 3-4 weeks.

## 2017-08-03 NOTE — Progress Notes (Signed)
Vitals:   08/03/17 0848  BP: (!) 170/88  Pulse: 73  Resp: 18  Temp: 97.8 F (36.6 C)  TempSrc: Oral  SpO2: 100%  Weight: 147 lb (66.7 kg)  Height:  (1.778 m)

## 2017-08-03 NOTE — Assessment & Plan Note (Signed)
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 Initiated nicotine replacement tx  patches. Patient will use fiance's store of nicotine patches until he calls 1 800 QUIT-NOW to receive his own patches. Patient counseled on purpose, proper use, and potential adverse effects, including skin irritation at application site, which can be avoided by rotating application site of patches. Provided information on 1 800-QUIT NOW support program.

## 2017-08-03 NOTE — Assessment & Plan Note (Signed)
Diabetes suboptimal control on  BID Metformin.  Titrate metformin from  twice daily up to  twice daily.

## 2017-08-03 NOTE — Progress Notes (Signed)
Referring Physician: Dr Primitivo Gauze  Patient name: Dylan Anderson MRN: [id number] DOB: Feb 20, 1961 Sex: male  REASON FOR CONSULT: Bilateral lower extremity peripheral arterial disease  HPI: [name] is a 57 y.o. male who previously had a left common iliac stent placed at [facility] several years ago.  At that point he was only able to walk 200- 300 feet before experiencing claudication symptoms.  He states that after his iliac stent was placed he was able to walk further.  He thinks now his walking distance has decreased somewhat again down to 3 to 400 feet.  He states he also has pain in his calves when going up ladders and he works as a Designer, fashion/clothing.  He occasionally has numbness and tingling in his feet.  He is a current smoker.  He has tried to quit unsuccessfully in the past.  Greater than 3 minutes today spent regarding smoking cessation counseling.  Other medical problems include COPD and he is getting testing for this later today.  He has shortness of breath with minimal activity and a productive cough.  He also has a history of diabetes which he says is currently stable.  He also complains intermittently of buttock and thigh claudication with certain activities.  He is on aspirin and statin.  Past Medical History:  Diagnosis Date  . Arthritis   . Back pain   . Claudication (HCC)   . Erectile dysfunction   . Hypertension 07/10/2012  . PVD (peripheral vascular disease) (HCC)   . Type 2 diabetes mellitus (HCC)    Past Surgical History:  Procedure Laterality Date  . stent Left    left common and left internal illiac stent    Family History  Problem Relation Age of Onset  . Heart failure Mother   . Osteoarthritis Mother     SOCIAL HISTORY: Social History   Socioeconomic History  . Marital status: Married    Spouse name: Not on file  . Number of children: Not on file  . Years of education: Not on file  . Highest education level: Not on file  Occupational History  . Occupation:  unemployed  Social Needs  . Financial resource strain: Not on file  . Food insecurity:    Worry: Not on file    Inability: Not on file  . Transportation needs:    Medical: Not on file    Non-medical: Not on file  Tobacco Use  . Smoking status: Current Some Day Smoker    Packs/day: 0.50    Years: 30.00    Pack years: 15.00    Types: Cigarettes  . Smokeless tobacco: Never Used  Substance and Sexual Activity  . Alcohol use: Yes  . Drug use: No  . Sexual activity: Yes    Partners: Female  Lifestyle  . Physical activity:    Days per week: Not on file    Minutes per session: Not on file  . Stress: Not on file  Relationships  . Social connections:    Talks on phone: Not on file    Gets together: Not on file    Attends religious service: Not on file    Active member of club or organization: Not on file    Attends meetings of clubs or organizations: Not on file    Relationship status: Not on file  . Intimate partner violence:    Fear of current or ex partner: Not on file    Emotionally abused: Not on file    Physically  abused: Not on file    Forced sexual activity: Not on file  Other Topics Concern  . Not on file  Social History Narrative  . Not on file    No Known Allergies  Current Outpatient Medications  Medication Sig Dispense Refill  . aspirin EC 81 MG tablet Take 1 tablet (81 mg total) by mouth daily.    [name] atorvastatin (LIPITOR) 40 MG tablet Take 1 tablet (40 mg total) by mouth daily. 30 tablet 11  . lisinopril (PRINIVIL,ZESTRIL) 10 MG tablet Take 1 tablet (10 mg total) by mouth daily. 90 tablet 1  . metFORMIN (GLUCOPHAGE) 500 MG tablet Take 1 tablet (500 mg total) by mouth 2 (two) times daily with a meal. 30 tablet 11  . pantoprazole (PROTONIX) 20 MG tablet Take 1 tablet (20 mg total) by mouth daily. 30 tablet 8   No current facility-administered medications for this visit.     ROS:   General:  No weight loss, Fever, chills  HEENT: No recent headaches, no  nasal bleeding, no visual changes, no sore throat  Neurologic: No dizziness, blackouts, seizures. No recent symptoms of stroke or mini- stroke. No recent episodes of slurred speech, or temporary blindness.  Cardiac: No recent episodes of chest pain/pressure, +shortness of breath at rest.  + shortness of breath with exertion.  Denies history of atrial fibrillation or irregular heartbeat  Vascular: No history of rest pain in feet.  + history of claudication.  No history of non-healing ulcer, No history of DVT   Pulmonary: No home oxygen, + productive cough, no hemoptysis,  + asthma or wheezing  Musculoskeletal:   Arthritis,  Low back pain,   Joint pain  Hematologic:No history of hypercoagulable state.  No history of easy bleeding.  No history of anemia  Gastrointestinal: No hematochezia or melena,  No gastroesophageal reflux, no trouble swallowing  Urinary:  chronic Kidney disease,  on HD -  MWF or  TTHS,  Burning with urination,  Frequent urination,  Difficulty urinating;   Skin: No rashes  Psychological: No history of anxiety,  No history of depression   Physical Examination  Vitals:   08/03/17 0848 08/03/17 0852  BP: (!) 170/88 (!) 166/88  Pulse: 73 73  Resp: 18   Temp: 97.8 F (36.6 C)   TempSrc: Oral   SpO2: 100%   Weight: 147 lb (66.7 kg)   Height:  (1.778 m)     Body mass index is 21.09 kg/m.  General:  Alert and oriented, no acute distress HEENT: Normal Neck: No bruit or JVD Pulmonary: Clear to auscultation bilaterally Cardiac: Regular Rate and Rhythm without murmur Abdomen: Soft, non-tender, non-distended, no mass, no scars Skin: No rash Extremity Pulses:  2+ radial, brachial, femoral, 2+ right absent left dorsalis pedis, 2+ posterior tibial pulses bilaterally Musculoskeletal: No deformity or edema  Neurologic: Upper and lower extremity motor 5/5 and symmetric  DATA:  She had a duplex ultrasound of his left iliac stent  today.  This showed very mild in-stent restenosis  ASSESSMENT: Patient with history of peripheral arterial disease.  Currently he has easily palpable posterior tibial pulses bilaterally.  His iliac stent was patent with mild in-stent restenosis.  Difficult to know whether his symptoms are related to worsening peripheral arterial disease as his buttock and thigh symptoms certainly sound like buttock claudication.  This may be due primarily to internal iliac artery occlusive disease which is not treatable.  However,  we had discussions today regarding lifestyle changes such as a walking program of 30 minutes daily as well as stopping smoking.  PLAN: #1 the patient will try to walk 30 minutes daily.  He will try to quit smoking.  He will return for follow-up appointment and see our nurse practitioner in 3 months with exercise ABIs at that office visit.  If these are abnormal we will consider intervention at that point.  Otherwise conservative management for now.   Fabienne Bruns, MD [facility] Office: [phone] Pager: [phone]

## 2017-08-03 NOTE — Progress Notes (Signed)
    S:  Patient arrives ambulating without assistance, accompanied by fiance, Bunny.   Patient arrives for evaluation of lung function and assistance with tobacco dependence.  Patient was referred on 07/12/2017 by Dr. Primitivo Gauze.  Patient was last seen by Primary Care Provider on 07/12/2017.   Age when started using tobacco on a daily basis 18. Number of Cigarettes per day 10-15. Brand smoked [employer]. Estimated Nicotine Content per Cigarette (mg) 0.8.  Estimated Nicotine intake per day 8-12 mg.   Smokes first cigarette 15-20 minutes after waking. Denies waking to smoke   Most recent quit attempt 30 years ago Longest time ever been tobacco free 9 months. Restarted smoking after this period when mother passed away.  Medications (NRT, bupropion, varenicline) used in prior in past cessation efforts include: None.   Rates IMPORTANCE of quitting tobacco on 1-10 scale of 10. Rates CONFIDENCE of quitting tobacco on 1-10 scale of 10.  Most common triggers to use tobacco include; stressors and aggravators at work.   Motivation to quit: Acknowledges health benefits of quitting and remembers his prior success to quitting  Patient does not report taking any COPD medications.  .medreviewdc   O: mMRC score= 2 CAT score= 16 See Documentation Flowsheet - CAT/COPD for complete symptom scoring.  See "scanned report" or Documentation Flowsheet (discrete results - PFTs) for  Spirometry results. Patient provided good effort while attempting spirometry.   Lung Age = 1  Albuterol Neb  Lot# [id number]     Exp. 10/20  Hemoglobin A1c: 8.1 (07/12/2017); 8.5 (04/20/2017)  Home CGBs reported: 200-300  A/P: Tobacco Cessation:  severe Nicotine Dependence of 37 years duration in a patient who is excellent candidate for success b/c of motivation and confidence to quit, in addition to past success in quitting tobacco. Initiated nicotine replacement tx  patches. Patient will use fiance's store of nicotine  patches until he calls 1 800 QUIT-NOW to receive his own patches. Patient counseled on purpose, proper use, and potential adverse effects, including skin irritation at application site, which can be avoided by rotating application site of patches. Provided information on 1 800-QUIT NOW support program.   Lung Function: Spirometry evaluation with Pre and Post Bronchodilator reveals near normal lung function. Reviewed results of pulmonary function tests. No addition or change to treatment plan at this time.   Medication Management: Diabetes suboptimal control on  BID Metformin.  Titrate metformin from  twice daily up to  twice daily.  Written information provided. Pt verbalized understanding of results and education. F/U Rx Clinic Visit in 3-4 weeks. Total time in face-to-face counseling 35-40 minutes.  Patient seen with Tama Headings, PharmD Candidate and Ladell Pier, PharmD, PGY1 Pharmacy Resident.[name]

## 2017-08-03 NOTE — Assessment & Plan Note (Signed)
Lung Function: Spirometry evaluation with Pre and Post Bronchodilator reveals near normal lung function. Reviewed results of pulmonary function tests. No addition or change to treatment plan at this time.

## 2017-08-31 ENCOUNTER — Ambulatory Visit (INDEPENDENT_AMBULATORY_CARE_PROVIDER_SITE_OTHER): Payer: Self-pay | Admitting: Pharmacist

## 2017-08-31 ENCOUNTER — Encounter: Payer: Self-pay | Admitting: Pharmacist

## 2017-08-31 DIAGNOSIS — E119 Type 2 diabetes mellitus without complications: Secondary | ICD-10-CM

## 2017-08-31 NOTE — Assessment & Plan Note (Signed)
Diabetes longstanding since 2014 currently uncontrolled per A1C however improved per CBG report. Patient is adherent with medication. Control is suboptimal due to stress from big toe pain, job changes, and smoking cessation attempts. -Continue metformin  BID. Will defer treatment change at this time until pain in big toe is more controlled.

## 2017-08-31 NOTE — Progress Notes (Signed)
    S:     Chief Complaint  Patient presents with  . Medication Management    smoking cessation, T2DM    Patient arrives ambulating with a limp, accompanied by fiance, Bonita.  Presents for smoking cessation follow-up, diabetes evaluation, education, and management at the request of Dr. Primitivo Gauze. Patient was referred on 07/12/2017.  Patient was last seen by Primary Care Provider on 07/12/2017.  Patient does not report any concerns or GI issues with recent increase in metformin dose. He did not bring his meter to clinic today, but reports CBGs have been in the high 100s with some in the 200s. He endorses feeling a lot better since he was last in clinic.  Patient reports adherence with medications.  Current diabetes medications include: metformin  BID Current hypertension medications include: lisinopril   Patient denies hypoglycemic events. Patient complains of pain and swelling in his big toe. Denies trauma to the area. Reports that he came home one day and realized it was in pain. Also reports that it used to be black/blue in color.   Reports smoking has decreased and he can now go 3-4 days at a time without smoking. On days he smokes, he reports he will smoke < 0.5 PPD.   O:  Physical Exam  Musculoskeletal: He exhibits tenderness (in big toe).     Review of Systems  Musculoskeletal: Positive for joint pain (in big toe).  Neurological: Positive for weakness (in both hands).  All other systems reviewed and are negative.    Lab Results  Component Value Date   HGBA1C 8.1 07/12/2017   Lipid Panel     Component Value Date/Time   CHOL 216 (H) 01/13/2017 1610   TRIG 83 01/13/2017 1610   HDL 50 01/13/2017 1610   CHOLHDL 4.3 01/13/2017 1610   CHOLHDL 5.7 07/10/2012 1301   VLDL 36 07/10/2012 1301   LDLCALC 149 (H) 01/13/2017 1610     Clinical ASCVD: Yes (PAD) CBGs: Reports 2hr post prandial (supper) of 150s-190s, excursions to 225s A/P: Diabetes longstanding since  2014 currently uncontrolled per A1C however improved per CBG report. Patient is adherent with medication. Control is suboptimal due to stress from big toe pain, job changes, and smoking cessation attempts. -Continue metformin  BID. Will defer treatment change at this time until pain in big toe is more controlled.  ASCVD risk - secondary prevention in patient with DM. Last LDL is not controlled.- high intensity statin indicated. Aspirin is indicated.  -Continued aspirin 81 mg  -Continued Atorvastatin 40 mg.   C/o pain in great toe - patient evaluated by Dr. Leveda Anna. Please see accompanying note. Uric acid level obtained.   Written patient instructions provided.  Total time in face to face counseling 45 minutes.   Follow up PCP Clinic Visit ASAP.   Patient seen with Tama Headings, PharmD Candidate and Devota Pace, PharmD, BCPS, PGY2 Pharmacy Resident.

## 2017-08-31 NOTE — Patient Instructions (Addendum)
Thank you for visiting [place] today!  Continue all of your medications and keep working on quitting smoking.  Follow-up with Dr. Primitivo Gauze as soon as you can.

## 2017-09-01 LAB — URIC ACID: Uric Acid: 8.2 mg/dL (ref 3.7–8.6)

## 2017-09-05 NOTE — Progress Notes (Signed)
Patient ID: Dylan Anderson, male   DOB: November 28, 1960, 57 y.o.   MRN: [id number] Reviewed: Agree with Dr. Macky LowerKoval's documentation and management.

## 2017-09-11 ENCOUNTER — Telehealth: Payer: Self-pay | Admitting: Family Medicine

## 2017-09-11 NOTE — Telephone Encounter (Signed)
Attempted to call patient multiple times this weekend regarding his elevated uric acid. Left message detailing that It seems consistent with gout and I will prescribe treatment. Would like to discuss with him over the phone prior to sending in scripts. Will continue try patient.  Myrene BuddyJacob Zeyna Mkrtchyan MD PGY-1 Family Medicine Resident

## 2017-09-12 ENCOUNTER — Telehealth: Payer: Self-pay | Admitting: Family Medicine

## 2017-09-12 MED ORDER — PREDNISONE 20 MG PO TABS: 20.0000 mg | ORAL_TABLET | Freq: Every day | ORAL | 0 refills | Status: DC

## 2017-09-12 MED ORDER — LOSARTAN POTASSIUM 50 MG PO TABS: 50.0000 mg | ORAL_TABLET | Freq: Every day | ORAL | 0 refills | Status: DC

## 2017-09-12 NOTE — Addendum Note (Signed)
Addended by: Delman CheadleFLETCHER, Minola Guin M on: 09/12/2017 05:09 PM   Modules accepted: Orders

## 2017-09-12 NOTE — Telephone Encounter (Signed)
Able to get in touch with patient. Still with profound great toe pain and swelling. Switching lisinopril 10mg  to losartan 50mg  daily to help with uric acid excretion. Also will do 7 day course of prednisone 40mg . Patient has follow up on 6/20 with dr. Raymondo BandKoval.  Myrene BuddyJacob Shameria Trimarco MD PGY-1 Family Medicine Resident

## 2017-09-14 ENCOUNTER — Other Ambulatory Visit: Payer: Self-pay

## 2017-09-15 MED ORDER — PREDNISONE 5 MG PO TABS: ORAL_TABLET | ORAL | 0 refills | Status: DC

## 2017-09-15 NOTE — Telephone Encounter (Signed)
Switched from 40mg  daily dose to 30mg  daily dose. Will take 6 tablets daily in 5mg  dose due to formulary at his pharmacy. Please let him know this change was made.  Myrene BuddyJacob Taggert Bozzi MD PGY-1 Family Medicine Resident

## 2017-09-15 NOTE — Addendum Note (Signed)
Addended by: Delman CheadleFLETCHER, Alyssha Housh M on: 09/15/2017 09:04 AM   Modules accepted: Orders

## 2017-09-18 NOTE — Telephone Encounter (Signed)
Left message with someone who answered the phone, to call us back tomorrow. Kaspar Albornoz Bruna PotterBlount, CMA

## 2017-09-21 ENCOUNTER — Other Ambulatory Visit: Payer: Self-pay

## 2017-09-21 ENCOUNTER — Encounter: Payer: Self-pay | Admitting: Family Medicine

## 2017-09-21 ENCOUNTER — Ambulatory Visit (INDEPENDENT_AMBULATORY_CARE_PROVIDER_SITE_OTHER): Payer: Self-pay | Admitting: Family Medicine

## 2017-09-21 VITALS — BP 130/80 | HR 77 | Temp 98.6°F | Ht 70.0 in | Wt 143.0 lb

## 2017-09-21 DIAGNOSIS — M109 Gout, unspecified: Secondary | ICD-10-CM | POA: Insufficient documentation

## 2017-09-21 DIAGNOSIS — M79642 Pain in left hand: Secondary | ICD-10-CM

## 2017-09-21 DIAGNOSIS — I739 Peripheral vascular disease, unspecified: Secondary | ICD-10-CM

## 2017-09-21 DIAGNOSIS — E119 Type 2 diabetes mellitus without complications: Secondary | ICD-10-CM

## 2017-09-21 HISTORY — DX: Gout, unspecified: M10.9

## 2017-09-21 MED ORDER — PREDNISONE 5 MG PO TABS: ORAL_TABLET | ORAL | 0 refills | Status: AC

## 2017-09-21 NOTE — Patient Instructions (Addendum)
It was great seeing you again Dylan Anderson. I am glad that you have had some improvement in your gout. I think I didn't give you a long enough course of the prednisone. I think an additional 7 days will be enough to make your foot better. Please make sure and get your hand xray.  I recommend robitussin cold and flu for cough syrup. You can also try benzocaine lozenges for sore throat. Please come back and see me in mid July for a1c recheck. Please keep your appointment with vascular surgery.

## 2017-09-26 ENCOUNTER — Encounter: Payer: Self-pay | Admitting: Family Medicine

## 2017-09-26 NOTE — Assessment & Plan Note (Signed)
Seen by [facility] on 5/2. Opted to treat conservatively with walking and smoking cessation. To follow up with them in early 11/2017. - follow up with vascular surgery - smoking cessation - excercise

## 2017-09-26 NOTE — Progress Notes (Signed)
   HPI  57 year old male who presents with red, swollen large toe on left foot. Patient had previously been diagnosed with gout based off of presentation and a uric acid level of 8.2. He was given a 30mg  prednisone burst for 5 days. He stated that the prednisone make his toe feel much better within one day. He stated that it started to be come more painful and swollen after treatment had been concluded for two days.  CC: left foot pain   ROS:  Review of Systems See HPI for ROS.   CC, SH/smoking status, and VS noted  Objective: BP 130/80   Pulse 77   Temp 98.6 F (37 C) (Oral)   Ht 5\' 10"  (1.778 m)   Wt 143 lb (64.9 kg)   SpO2 97%   BMI 20.52 kg/m  Gen: NAD, alert, cooperative, and pleasant. HEENT: NCAT, EOMI, PERRL CV: RRR, no murmur Resp: CTAB, no wheezes, non-labored Abd: SNTND, BS present, no guarding or organomegaly Ext: No edema, warm Neuro: Alert and oriented, Speech clear, No gross deficits Foot: swollen, warm, enlarged red great toe on left foot   Assessment and plan:  Peripheral artery disease (HCC) Seen by [facility] on 5/2. Opted to treat conservatively with walking and smoking cessation. To follow up with them in early 11/2017. - follow up with vascular surgery - smoking cessation - excercise  Diabetes mellitus type II, non insulin dependent (HCC) Continue metformin 1g bid. Next a1c due in early July.  Acute gout involving toe of left foot Will give another course of prednisone. Likely treatment wasn't extended long enough. Will give 7 day course of 30mg  prednisone.  Hand pain Hand pain unchanged. Still has not gotten xray that has been ordered since 04/2017.   No orders of the defined types were placed in this encounter.   Meds ordered this encounter  Medications  . predniSONE (DELTASONE) 5 MG tablet    Sig: Take 6 tablets by mouth daily with breakfast for a 30mg  daily amount    Dispense:  42 tablet    Refill:  0     Myrene BuddyJacob Anthea Udovich MD PGY-1 Family  Medicine Resident  09/26/2017 6:36 PM

## 2017-09-26 NOTE — Assessment & Plan Note (Signed)
Continue metformin 1g bid. Next a1c due in early July.

## 2017-09-26 NOTE — Assessment & Plan Note (Signed)
Hand pain unchanged. Still has not gotten xray that has been ordered since 04/2017.

## 2017-09-26 NOTE — Assessment & Plan Note (Addendum)
Will give another course of prednisone. Likely treatment wasn't extended long enough. Will give 7 day course of 30mg  prednisone.

## 2017-10-03 ENCOUNTER — Encounter

## 2017-10-18 ENCOUNTER — Ambulatory Visit: Payer: Self-pay | Admitting: Family Medicine

## 2017-10-23 ENCOUNTER — Other Ambulatory Visit: Payer: Self-pay

## 2017-10-23 DIAGNOSIS — I739 Peripheral vascular disease, unspecified: Secondary | ICD-10-CM

## 2017-11-02 ENCOUNTER — Ambulatory Visit (INDEPENDENT_AMBULATORY_CARE_PROVIDER_SITE_OTHER): Payer: Self-pay | Admitting: Family Medicine

## 2017-11-02 DIAGNOSIS — E119 Type 2 diabetes mellitus without complications: Secondary | ICD-10-CM

## 2017-11-02 DIAGNOSIS — I739 Peripheral vascular disease, unspecified: Secondary | ICD-10-CM

## 2017-11-02 LAB — POCT GLYCOSYLATED HEMOGLOBIN (HGB A1C): HBA1C, POC (CONTROLLED DIABETIC RANGE): 8.6 % — AB (ref 0.0–7.0)

## 2017-11-02 NOTE — Patient Instructions (Addendum)
It was great seeing you again today! We discussed your diabetes, leg pain, and dizzy spells. Your a1c has increased to 8.6. I think this might be largely due to your steroid for the gout flare. I am glad the gout has been doing much better. Your leg pain is likely caused by claudication, which is likely due to your narrowing of your arteries in your leg. Definitely keep your vascular surgery appointment. As far as your dizzy spells, I am not sure what to make of this. Definitely keep a close eye on these episodes, we will re-evaluate at the next visit.

## 2017-11-05 ENCOUNTER — Encounter: Payer: Self-pay | Admitting: Family Medicine

## 2017-11-05 DIAGNOSIS — I739 Peripheral vascular disease, unspecified: Secondary | ICD-10-CM | POA: Insufficient documentation

## 2017-11-05 NOTE — Assessment & Plan Note (Signed)
hgb a1c 8.6 from 8.1. Patient with multiple week-long episodes of gout, plus multiple weeks of gout flare prior to treatment. While he is inching closer to needing a second agent, his increased a1c is likely due to his gout. Will recheck a1c. If still increased at that time will add on second oral medication.

## 2017-11-05 NOTE — Progress Notes (Signed)
   HPI 57 year old who presents due for a1c check. Patient is also having bilateral lower extremity pain. This pain mainly occurs when he is active. The pain will quickly go away after he rests. This is unchanged from his previous episodes of claudication. His left hand pain has improved and he is not longer desiring an xray.  Patient has also had multiple episodes of gout, which required 2 week long steroid bursts. His gout has now resolved.  CC: a1c check   ROS:   Review of Systems See HPI for ROS.   CC, SH/smoking status, and VS noted  Objective: There were no vitals taken for this visit. Gen: NAD, alert, cooperative, and pleasant. HEENT: NCAT, EOMI, PERRL CV: RRR, no murmur Resp: CTAB, no wheezes, non-labored Abd: SNTND, BS present, no guarding or organomegaly Ext: No edema, warm Neuro: Alert and oriented, Speech clear, No gross deficits   Assessment and plan:  Diabetes mellitus type II, non insulin dependent (HCC) hgb a1c 8.6 from 8.1. Patient with multiple week-long episodes of gout, plus multiple weeks of gout flare prior to treatment. While he is inching closer to needing a second agent, his increased a1c is likely due to his gout. Will recheck a1c. If still increased at that time will add on second oral medication.  Claudication [facility](HCC) Patient with continued symptoms of claudication from known arterial stenosis. Has an appointment with vascular surgery on 8/6. If his ABIs are abnormal then [facility] will consider intervention.   Orders Placed This Encounter  Procedures  . HgB A1c    No orders of the defined types were placed in this encounter.    Myrene BuddyJacob Jlee Harkless MD PGY-2 Family Medicine Resident  11/05/2017 11:44 PM

## 2017-11-05 NOTE — Assessment & Plan Note (Signed)
Patient with continued symptoms of claudication from known arterial stenosis. Has an appointment with vascular surgery on 8/6. If his ABIs are abnormal then [facility] will consider intervention.

## 2017-11-07 ENCOUNTER — Ambulatory Visit (INDEPENDENT_AMBULATORY_CARE_PROVIDER_SITE_OTHER): Payer: Self-pay | Admitting: Family

## 2017-11-07 ENCOUNTER — Encounter: Payer: Self-pay | Admitting: Family

## 2017-11-07 ENCOUNTER — Other Ambulatory Visit: Payer: Self-pay

## 2017-11-07 ENCOUNTER — Ambulatory Visit (HOSPITAL_COMMUNITY)
Admission: RE | Admit: 2017-11-07 | Discharge: 2017-11-07 | Disposition: A | Discharge status: Home / Self Care | Payer: Self-pay | Source: Ambulatory Visit | Attending: Vascular Surgery | Admitting: Vascular Surgery

## 2017-11-07 VITALS — BP 139/74 | HR 65 | Temp 98.2°F | Resp 18 | Ht 70.0 in | Wt 143.0 lb

## 2017-11-07 DIAGNOSIS — I739 Peripheral vascular disease, unspecified: Secondary | ICD-10-CM

## 2017-11-07 DIAGNOSIS — R0989 Other specified symptoms and signs involving the circulatory and respiratory systems: Secondary | ICD-10-CM | POA: Insufficient documentation

## 2017-11-07 DIAGNOSIS — F17200 Nicotine dependence, unspecified, uncomplicated: Secondary | ICD-10-CM | POA: Insufficient documentation

## 2017-11-07 DIAGNOSIS — I779 Disorder of arteries and arterioles, unspecified: Secondary | ICD-10-CM

## 2017-11-07 DIAGNOSIS — E785 Hyperlipidemia, unspecified: Secondary | ICD-10-CM | POA: Insufficient documentation

## 2017-11-07 DIAGNOSIS — G450 Vertebro-basilar artery syndrome: Secondary | ICD-10-CM

## 2017-11-07 DIAGNOSIS — E1151 Type 2 diabetes mellitus with diabetic peripheral angiopathy without gangrene: Secondary | ICD-10-CM | POA: Insufficient documentation

## 2017-11-07 DIAGNOSIS — Z95828 Presence of other vascular implants and grafts: Secondary | ICD-10-CM

## 2017-11-07 DIAGNOSIS — I10 Essential (primary) hypertension: Secondary | ICD-10-CM | POA: Insufficient documentation

## 2017-11-07 NOTE — Progress Notes (Signed)
[facility] [place]   CC: Follow up peripheral artery occlusive disease  History of Present Illness Dylan Anderson is a 57 y.o. male who previously had a left common iliac stent placed at [facility] several years ago.  At that point he was only able to walk 200- 300 feet before experiencing claudication symptoms.  He states that after his iliac stent was placed he was able to walk further.  He thinks now his walking distance has decreased somewhat again down to 3 to 400 feet.  He states he also has pain in his calves when going up ladders and he works as a Designer, fashion/clothing.  He occasionally has numbness and tingling in his feet.  He is a current smoker.  He has tried to quit unsuccessfully in the past.     Other medical problems include COPD.Dylan Anderson  He has shortness of breath with minimal activity and a productive cough.  He also complains intermittently of buttock and thigh claudication with certain activities.  He is on aspirin and statin.   Dr. Darrick Penna last evaluated pt on 08-03-17. At that time pt had easily palpable posterior tibial pulses bilaterally.  His iliac stent was patent with mild in-stent restenosis.  Difficult to know whether his symptoms are related to worsening peripheral arterial disease as his buttock and thigh symptoms certainly sound like buttock claudication.  This may be due primarily to internal iliac artery occlusive disease which is not treatable.  However, we had discussions today regarding lifestyle changes such as a walking program of 30 minutes daily as well as stopping smoking.  The patient was to try to walk 30 minutes daily.  He was to try to quit smoking.  He was to return for follow-up appointment and see our nurse practitioner in 3 months with exercise ABIs at that office visit.  If these are abnormal we will consider intervention at that point.  Otherwise conservative management for the time being.   If he looks up for a minute or more his vision fades and the  right side of his body feels out of control.  He reports numbness and loss of grip in both hands at times. He denies any known history of stroke or TIA.  He has numbness in both feet and lower legs.  He denies non healing wounds.   He states he was recently diagnosed with gout in his right great toe, states his blood sugar increased with the medication used to treat.    Diabetic: Yes, A1C was 8.6 on 11-02-17, was 7.9 in October 2018 (review of records), uncontrolled, was diagnosed about age 74  Tobacco use: smoker  (1/2 ppd, started in 1980)  Pt meds include: Statin :Yes Betablocker: No ASA: Yes Other anticoagulants/antiplatelets: no  Past Medical History:  Diagnosis Date  . Arthritis   . Back pain   . Claudication (HCC)   . Erectile dysfunction   . Hypertension 07/10/2012  . PVD (peripheral vascular disease) (HCC)   . Type 2 diabetes mellitus (HCC)     Social History Social History   Tobacco Use  . Smoking status: Current Some Day Smoker    Packs/day: 0.50    Years: 17.00    Pack years: 8.50    Types: Cigarettes    Start date: 04/04/1978  . Smokeless tobacco: Never Used  . Tobacco comment: Max 1 ppd  Substance Use Topics  . Alcohol use: Yes  . Drug use: No    Family History Family History  Problem Relation Age of Onset  . Heart failure Mother   . Osteoarthritis Mother     Past Surgical History:  Procedure Laterality Date  . stent Left    left common and left internal illiac stent    No Known Allergies  Current Outpatient Medications  Medication Sig Dispense Refill  . aspirin EC 81 MG tablet Take 1 tablet (81 mg total) by mouth daily.    Dylan Anderson atorvastatin (LIPITOR) 40 MG tablet Take 1 tablet (40 mg total) by mouth daily. 30 tablet 11  . losartan (COZAAR) 50 MG tablet Take 1 tablet (50 mg total) by mouth daily. 90 tablet 0  . metFORMIN (GLUCOPHAGE) 500 MG tablet Take 2 tablets (1,000 mg total) by mouth 2 (two) times daily with a meal. 120 tablet 11  . Multiple  Vitamin (MULTIVITAMIN WITH MINERALS) TABS tablet Take 1 tablet by mouth daily.    . pantoprazole (PROTONIX) 20 MG tablet Take 1 tablet (20 mg total) by mouth daily. 30 tablet 8   No current facility-administered medications for this visit.     ROS: See HPI for pertinent positives and negatives.   Physical Examination  Vitals:   11/07/17 1437  BP: 139/74  Pulse: 65  Resp: 18  Temp: 98.2 F (36.8 C)  TempSrc: Oral  SpO2: 99%  Weight: 143 lb (64.9 kg)  Height: 5\' 10"  (1.778 m)   Body mass index is 20.52 kg/m.  General: A&O x 3, WDWN, fit appearing male. Gait: normal HENT: No gross abnormalities.  Eyes: PERRLA. Pulmonary: Respirations are non labored, CTAB, good air movement in all fields Cardiac: regular rhythm, no detected murmur.         Carotid Bruits Right Left   Negative Negative   Radial pulses are 2+ palpable bilaterally   Adominal aortic pulse is not palpable                         VASCULAR EXAM: Extremities without ischemic changes, without Gangrene; without open wounds.                                                                                                          LE Pulses Right Left       FEMORAL  2+ palpable  2+ palpable        POPLITEAL  2+ palpable   1+ palpable       POSTERIOR TIBIAL  2+ palpable   2+ palpable        DORSALIS PEDIS      ANTERIOR TIBIAL 1+ palpable  not palpable    Abdomen: soft, NT, no palpable masses. Skin: no rashes, no cellulitis, no ulcers noted. Musculoskeletal: no muscle wasting or atrophy.  Neurologic: A&O X 3; appropriate affect, Sensation is normal; MOTOR FUNCTION:  moving all extremities equally, motor strength 5/5 throughout. Speech is fluent/normal. CN 2-12 intact. Psychiatric: Thought content is normal, mood appropriate for clinical situation.     ASSESSMENT: Dylan Anderson is a 57 y.o. male who previously had a left common iliac  [facility].  He occasionally has  bilateral hip pain with walking or not walking, does not seem to have claudication symptoms. His ABI's are normal, TBI' are normal with all biphasic waveforms. Therefor his hips pain is not from lack or arterial perfusion.  Differential diagnoses would be osteoarthritis of hips and or lumbar spine, and DDD of the lumbar spine. Defer to his PCP for further evaluation of this.   He has fading vision and loss of control of the right side of his body when he hyperextends his neck for over a minute; these sx's subside after a few minutes of returning his head to neutral position;  symptoms could be vertebral artery syndrome; will evaluate vertebral and carotid arteries in a month. He has no known history of stroke or TIA.  He has a hx of rapid heart rate of unknown type, stats he wore a monitor for a while, and something was found that he does not recall.  If these are not significantly stenosed, consider evaluation of c-spine; defer to his PCP.   He has diabetic neuropathy in his feet, lower legs, and hands.    His atherosclerotic    DATA  ABI (Date: 11/07/2017):  R:   ABI: 1.06 (no previous results on file ),   PT: bi  DP: bi  TBI:  0.82  L:   ABI: 1.02 (no previous),   PT: bi  DP: bi  TBI: 0.88 Bilateral ABI and TBI are normal with all biphasic waveforms.     PLAN:  Based on the patient's vascular studies and examination, pt will be scheduled for carotid duplex in 1 month, consider ABI's in a year.   Over 3 minutes was spent counseling patient re smoking cessation, and patient was given several free resources re smoking cessation.  I discussed in depth with the patient the nature of atherosclerosis, and emphasized the importance of maximal medical management including strict control of blood pressure, blood glucose, and lipid levels, obtaining regular exercise, and cessation of smoking.  The patient is aware that without maximal medical management the underlying  atherosclerotic disease process will progress, limiting the benefit of any interventions.  The patient was given information about PAD including signs, symptoms, treatment, what symptoms should prompt the patient to seek immediate medical care, and risk reduction measures to take.  Charisse MarchSuzanne Eily Louvier, RN, MSN, FNP-C [facility] [place] Office Phone: [phone]  Clinic MD: Early  11/07/17 2:44 PM

## 2017-11-07 NOTE — Patient Instructions (Signed)
Steps to Quit Smoking Smoking tobacco can be bad for your health. It can also affect almost every organ in your body. Smoking puts you and people around you at risk for many serious long-lasting (chronic) diseases. Quitting smoking is hard, but it is one of the best things that you can do for your health. It is never too late to quit. What are the benefits of quitting smoking? When you quit smoking, you lower your risk for getting serious diseases and conditions. They can include:  Lung cancer or lung disease.  Heart disease.  Stroke.  Heart attack.  Not being able to have children (infertility).  Weak bones (osteoporosis) and broken bones (fractures).  If you have coughing, wheezing, and shortness of breath, those symptoms may get better when you quit. You may also get sick less often. If you are pregnant, quitting smoking can help to lower your chances of having a baby of low birth weight. What can I do to help me quit smoking? Talk with your doctor about what can help you quit smoking. Some things you can do (strategies) include:  Quitting smoking totally, instead of slowly cutting back how much you smoke over a period of time.  Going to in-person counseling. You are more likely to quit if you go to many counseling sessions.  Using resources and support systems, such as: ? Online chats with a counselor. ? Phone quitlines. ? Printed self-help materials. ? Support groups or group counseling. ? Text messaging programs. ? Mobile phone apps or applications.  Taking medicines. Some of these medicines may have nicotine in them. If you are pregnant or breastfeeding, do not take any medicines to quit smoking unless your doctor says it is okay. Talk with your doctor about counseling or other things that can help you.  Talk with your doctor about using more than one strategy at the same time, such as taking medicines while you are also going to in-person counseling. This can help make  quitting easier. What things can I do to make it easier to quit? Quitting smoking might feel very hard at first, but there is a lot that you can do to make it easier. Take these steps:  Talk to your family and friends. Ask them to support and encourage you.  Call phone quitlines, reach out to support groups, or work with a counselor.  Ask people who smoke to not smoke around you.  Avoid places that make you want (trigger) to smoke, such as: ? Bars. ? Parties. ? Smoke-break areas at work.  Spend time with people who do not smoke.  Lower the stress in your life. Stress can make you want to smoke. Try these things to help your stress: ? Getting regular exercise. ? Deep-breathing exercises. ? Yoga. ? Meditating. ? Doing a body scan. To do this, close your eyes, focus on one area of your body at a time from head to toe, and notice which parts of your body are tense. Try to relax the muscles in those areas.  Download or buy apps on your mobile phone or tablet that can help you stick to your quit plan. There are many free apps, such as QuitGuide from the CDC (Centers for Disease Control and Prevention). You can find more support from smokefree.gov and other websites.  This information is not intended to replace advice given to you by your health care provider. Make sure you discuss any questions you have with your health care provider. Document Released: 01/15/2009 Document   Revised: 11/17/2015 Document Reviewed: 08/05/2014 Elsevier Interactive Patient Education  2018 Elsevier Inc.     Peripheral Vascular Disease Peripheral vascular disease (PVD) is a disease of the blood vessels that are not part of your heart and brain. A simple term for PVD is poor circulation. In most cases, PVD narrows the blood vessels that carry blood from your heart to the rest of your body. This can result in a decreased supply of blood to your arms, legs, and internal organs, like your stomach or kidneys.  However, it most often affects a person's lower legs and feet. There are two types of PVD.  Organic PVD. This is the more common type. It is caused by damage to the structure of blood vessels.  Functional PVD. This is caused by conditions that make blood vessels contract and tighten (spasm).  Without treatment, PVD tends to get worse over time. PVD can also lead to acute ischemic limb. This is when an arm or limb suddenly has trouble getting enough blood. This is a medical emergency. Follow these instructions at home:  Take medicines only as told by your doctor.  Do not use any tobacco products, including cigarettes, chewing tobacco, or electronic cigarettes. If you need help quitting, ask your doctor.  Lose weight if you are overweight, and maintain a healthy weight as told by your doctor.  Eat a diet that is low in fat and cholesterol. If you need help, ask your doctor.  Exercise regularly. Ask your doctor for some good activities for you.  Take good care of your feet. ? Wear comfortable shoes that fit well. ? Check your feet often for any cuts or sores. Contact a doctor if:  You have cramps in your legs while walking.  You have leg pain when you are at rest.  You have coldness in a leg or foot.  Your skin changes.  You are unable to get or have an erection (erectile dysfunction).  You have cuts or sores on your feet that are not healing. Get help right away if:  Your arm or leg turns cold and blue.  Your arms or legs become red, warm, swollen, painful, or numb.  You have chest pain or trouble breathing.  You suddenly have weakness in your face, arm, or leg.  You become very confused or you cannot speak.  You suddenly have a very bad headache.  You suddenly cannot see. This information is not intended to replace advice given to you by your health care provider. Make sure you discuss any questions you have with your health care provider. Document Released:  06/15/2009 Document Revised: 08/27/2015 Document Reviewed: 08/29/2013 Elsevier Interactive Patient Education  2017 Elsevier Inc.  

## 2017-11-09 ENCOUNTER — Other Ambulatory Visit: Payer: Self-pay

## 2017-11-09 DIAGNOSIS — I6529 Occlusion and stenosis of unspecified carotid artery: Secondary | ICD-10-CM

## 2017-12-18 ENCOUNTER — Other Ambulatory Visit: Payer: Self-pay

## 2017-12-18 ENCOUNTER — Ambulatory Visit (HOSPITAL_COMMUNITY)
Admission: RE | Admit: 2017-12-18 | Discharge: 2017-12-18 | Disposition: A | Discharge status: Home / Self Care | Payer: Self-pay | Source: Ambulatory Visit | Attending: Surgery | Admitting: Surgery

## 2017-12-18 ENCOUNTER — Ambulatory Visit (HOSPITAL_COMMUNITY)
Admission: RE | Admit: 2017-12-18 | Discharge: 2017-12-18 | Disposition: A | Discharge status: Home / Self Care | Payer: Self-pay | Source: Ambulatory Visit | Attending: Family Medicine | Admitting: Family Medicine

## 2017-12-18 ENCOUNTER — Encounter (INDEPENDENT_AMBULATORY_CARE_PROVIDER_SITE_OTHER): Payer: Self-pay

## 2017-12-18 ENCOUNTER — Ambulatory Visit (INDEPENDENT_AMBULATORY_CARE_PROVIDER_SITE_OTHER): Payer: Self-pay | Admitting: Family

## 2017-12-18 ENCOUNTER — Encounter: Payer: Self-pay | Admitting: Family

## 2017-12-18 VITALS — BP 137/78 | HR 69 | Temp 97.8°F | Resp 18 | Ht 71.0 in | Wt 145.0 lb

## 2017-12-18 DIAGNOSIS — E1151 Type 2 diabetes mellitus with diabetic peripheral angiopathy without gangrene: Secondary | ICD-10-CM

## 2017-12-18 DIAGNOSIS — F17200 Nicotine dependence, unspecified, uncomplicated: Secondary | ICD-10-CM

## 2017-12-18 DIAGNOSIS — I779 Disorder of arteries and arterioles, unspecified: Secondary | ICD-10-CM

## 2017-12-18 DIAGNOSIS — IMO0002 Reserved for concepts with insufficient information to code with codable children: Secondary | ICD-10-CM

## 2017-12-18 DIAGNOSIS — M79642 Pain in left hand: Secondary | ICD-10-CM | POA: Insufficient documentation

## 2017-12-18 DIAGNOSIS — Z95828 Presence of other vascular implants and grafts: Secondary | ICD-10-CM

## 2017-12-18 DIAGNOSIS — I6529 Occlusion and stenosis of unspecified carotid artery: Secondary | ICD-10-CM | POA: Insufficient documentation

## 2017-12-18 DIAGNOSIS — E1165 Type 2 diabetes mellitus with hyperglycemia: Secondary | ICD-10-CM

## 2017-12-18 NOTE — Patient Instructions (Signed)
Steps to Quit Smoking Smoking tobacco can be bad for your health. It can also affect almost every organ in your body. Smoking puts you and people around you at risk for many serious long-lasting (chronic) diseases. Quitting smoking is hard, but it is one of the best things that you can do for your health. It is never too late to quit. What are the benefits of quitting smoking? When you quit smoking, you lower your risk for getting serious diseases and conditions. They can include:  Lung cancer or lung disease.  Heart disease.  Stroke.  Heart attack.  Not being able to have children (infertility).  Weak bones (osteoporosis) and broken bones (fractures).  If you have coughing, wheezing, and shortness of breath, those symptoms may get better when you quit. You may also get sick less often. If you are pregnant, quitting smoking can help to lower your chances of having a baby of low birth weight. What can I do to help me quit smoking? Talk with your doctor about what can help you quit smoking. Some things you can do (strategies) include:  Quitting smoking totally, instead of slowly cutting back how much you smoke over a period of time.  Going to in-person counseling. You are more likely to quit if you go to many counseling sessions.  Using resources and support systems, such as: ? Online chats with a counselor. ? Phone quitlines. ? Printed self-help materials. ? Support groups or group counseling. ? Text messaging programs. ? Mobile phone apps or applications.  Taking medicines. Some of these medicines may have nicotine in them. If you are pregnant or breastfeeding, do not take any medicines to quit smoking unless your doctor says it is okay. Talk with your doctor about counseling or other things that can help you.  Talk with your doctor about using more than one strategy at the same time, such as taking medicines while you are also going to in-person counseling. This can help make  quitting easier. What things can I do to make it easier to quit? Quitting smoking might feel very hard at first, but there is a lot that you can do to make it easier. Take these steps:  Talk to your family and friends. Ask them to support and encourage you.  Call phone quitlines, reach out to support groups, or work with a counselor.  Ask people who smoke to not smoke around you.  Avoid places that make you want (trigger) to smoke, such as: ? Bars. ? Parties. ? Smoke-break areas at work.  Spend time with people who do not smoke.  Lower the stress in your life. Stress can make you want to smoke. Try these things to help your stress: ? Getting regular exercise. ? Deep-breathing exercises. ? Yoga. ? Meditating. ? Doing a body scan. To do this, close your eyes, focus on one area of your body at a time from head to toe, and notice which parts of your body are tense. Try to relax the muscles in those areas.  Download or buy apps on your mobile phone or tablet that can help you stick to your quit plan. There are many free apps, such as QuitGuide from the CDC (Centers for Disease Control and Prevention). You can find more support from smokefree.gov and other websites.  This information is not intended to replace advice given to you by your health care provider. Make sure you discuss any questions you have with your health care provider. Document Released: 01/15/2009 Document   Revised: 11/17/2015 Document Reviewed: 08/05/2014 Elsevier Interactive Patient Education  2018 Elsevier Inc.  

## 2017-12-18 NOTE — Progress Notes (Signed)
[facility] [place] HISTORY AND PHYSICAL   CC: Follow up peripheral artery occlusive disease    History of Present Illness:   Dylan Anderson is a 57 y.o. male who previously had a left common iliac stent placed at [facility] several years ago. At that point he was only able to walk 200-300 feet before experiencing claudication symptoms. He states that after his iliac stent was placed he was able to walk further. He thinks now his walking distance has decreased somewhat again down to 3 to 400 feet. He states he also has pain in his calves when going up ladders and he works as a Designer, fashion/clothing. He occasionally has numbness and tingling in his feet. He is a current smoker. He has tried to quit unsuccessfully in the past.   Other medical problems include COPD.[name] He has shortness of breath with minimal activity and a productive cough.  He also complains intermittently of buttock and thigh claudication with certain activities. He is on aspirin and statin.   Dr. Darrick Penna last evaluated pt on 08-03-17. At that time pt had easily palpable posterior tibial pulses bilaterally. His iliac stent was patent with mild in-stent restenosis. Difficult to know whether his symptoms are related to worsening peripheral arterial disease as his buttock and thigh symptoms certainly sound like buttock claudication. This may be due primarily to internal iliac artery occlusive disease which is not treatable. However, we had discussions today regarding lifestyle changes such as a walking program of 30 minutes daily as well as stopping smoking. The patient was to try to walk 30 minutes daily. He was to try to quit smoking. He was to return for follow-up appointment and see our nurse practitioner in 3 months with exercise ABIs at that office visit. If these are abnormal we will consider intervention at that point. Otherwise conservative management for the time being.   If he looks up for a minute or  more his vision fades and the right side of his body feels out of control.  He reports numbness and loss of grip in both hands at times. He denies any known history of stroke or TIA.  He has numbness in both feet and lower legs.  He denies non healing wounds.   He states he was recently diagnosed with gout in his right great toe, states his blood sugar increased with the medication used to treat.    Diabetic: Yes, A1C was 8.6 on 11-02-17, was 7.9 in October 2018 (review of records), uncontrolled, was diagnosed about age 14  Tobacco use: smoker  (1/2 ppd, started in 1980)  Pt meds include: Statin :Yes Betablocker: No ASA: Yes Other anticoagulants/antiplatelets: no    Past Medical History:  Diagnosis Date  . Arthritis   . Back pain   . Claudication (HCC)   . Erectile dysfunction   . Hypertension 07/10/2012  . PVD (peripheral vascular disease) (HCC)   . Type 2 diabetes mellitus (HCC)     Social History Social History   Tobacco Use  . Smoking status: Current Some Day Smoker    Packs/day: 0.50    Years: 17.00    Pack years: 8.50    Types: Cigarettes    Start date: 04/04/1978  . Smokeless tobacco: Never Used  . Tobacco comment: Max 1 ppd  Substance Use Topics  . Alcohol use: Yes  . Drug use: No    Family History Family History  Problem Relation Age of Onset  . Heart failure Mother   .  Osteoarthritis Mother     Surgical History Past Surgical History:  Procedure Laterality Date  . stent Left    left common and left internal illiac stent    No Known Allergies  Current Outpatient Medications  Medication Sig Dispense Refill  . aspirin EC 81 MG tablet Take 1 tablet (81 mg total) by mouth daily.    [name]. atorvastatin (LIPITOR) 40 MG tablet Take 1 tablet (40 mg total) by mouth daily. 30 tablet 11  . losartan (COZAAR) 50 MG tablet Take 1 tablet (50 mg total) by mouth daily. 90 tablet 0  . metFORMIN (GLUCOPHAGE) 500 MG tablet Take 2 tablets (1,000 mg total) by mouth 2  (two) times daily with a meal. 120 tablet 11  . Multiple Vitamin (MULTIVITAMIN WITH MINERALS) TABS tablet Take 1 tablet by mouth daily.    . pantoprazole (PROTONIX) 20 MG tablet Take 1 tablet (20 mg total) by mouth daily. 30 tablet 8  . cilostazol (PLETAL) 100 MG tablet TAKE ONE TABLET BY MOUTH TWICE DAILY     No current facility-administered medications for this visit.      REVIEW OF SYSTEMS: See HPI for pertinent positives and negatives.  Physical Examination Vitals:   12/18/17 1438 12/18/17 1441  BP: 137/76 137/78  Pulse: 71 69  Resp: 18   Temp: 97.8 F (36.6 C)   SpO2: 98%   Weight: 145 lb (65.8 kg)   Height: 5\' 11"  (1.803 m)    Body mass index is 20.22 kg/m.  General: A&O x 3, WDWN, fit appearing male. Gait: normal HENT: No gross abnormalities.  Eyes: PERRLA. Pulmonary: Respirations are non labored, CTAB, good air movement in all fields Cardiac: regular rhythm, no detected murmur.         Carotid Bruits Right Left   Negative Negative   Radial pulses are 2+ palpable bilaterally   Adominal aortic pulse is not palpable                         VASCULAR EXAM: Extremities without ischemic changes, without Gangrene; without open wounds.                                                                                                                                                       LE Pulses Right Left       FEMORAL  2+ palpable  2+ palpable        POPLITEAL  2+ palpable   1+ palpable       POSTERIOR TIBIAL  2+ palpable   2+ palpable        DORSALIS PEDIS      ANTERIOR TIBIAL 1+ palpable  not palpable    Abdomen: soft, NT, no palpable masses. Skin: no rashes, no cellulitis, no ulcers noted. Musculoskeletal: no muscle wasting or  atrophy.      Neurologic: A&O X 3; appropriate affect, Sensation is normal; MOTOR FUNCTION:  moving all extremities equally, motor strength 5/5 throughout. Speech is fluent/normal. CN 2-12 intact. Psychiatric: Thought  content is normal, mood appropriate for clinical situation    ASSESSMENT:  Dylan Anderson is a 57 y.o. male who previously had a left common iliac stent placed at [facility] several years ago.  He occasionally has bilateral hip pain with walking or not walking, does not seem to have claudication symptoms. His ABI's are normal, TBI's are normal with all biphasic waveforms. Therefore his bilateral hip pain is not from lack or arterial perfusion.  Differential diagnoses would be osteoarthritis of hips and or lumbar spine, and DDD of the lumbar spine. See Plan.  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 He has no known history of stroke or TIA.   He has a hx of rapid heart rate of unknown type, stats he wore a monitor for a while, and something was found that he does not recall.   He has diabetic neuropathy in his feet, lower legs, and hands.    His atherosclerotic risk factors include uncontrolled DM and smoking since 1980.  He takes a daily and a statin.    DATA  Carotid Duplex (12-18-17): Bilateral ICA with 1-39% stenosis Bilateral vertebral artery flow is antegrade.  Bilateral subclavian artery waveforms are normal.     ABI (Date: 11/07/2017):  R:  ? ABI: 1.06 (no previous results on file ),  ? PT: bi ? DP: bi ? TBI:  0.82  L:  ? ABI: 1.02 (no previous),  ? PT: bi ? DP: bi ? TBI: 0.88 Bilateral ABI and TBI are normal with all biphasic waveforms.     PLAN:  Based on the patient's vascular studies and examination, pt will be scheduled for ABI's in a year.  I advised pt and his wife to notify us if he develops concerns re the circulation in his feet or legs.  Pt prefers that I refer him to a  spine specialist, instead of his PCP, to evaluate his c-spine  (fading vision and loss of control of the right side of his body when he hyperextends his neck for over a minute; these sx's subside after a few minutes of returning his head to neutral position), and pain, tingling, and numbness in his hips, thighs, and feet. Some component the lower legs sx's may be due to DM neuropathy. The hips and thigh pain is life limiting for him.  Referral to [facility].   He is attempting to quit smoking.   I discussed in depth with the patient the nature of atherosclerosis, and emphasized the importance of maximal medical management including strict control of blood pressure, blood glucose, and lipid levels, obtaining regular exercise, and cessation of smoking.  The patient is aware that without maximal medical management the underlying atherosclerotic disease process will progress, limiting the benefit of any interventions.  Thank you for allowing [place] to participate in this patient's care.  Charisse March, RN, MSN, FNP-C [facility] Office: [phone]  Clinic MD: Myra Gianotti 12/18/2017 3:59 PM

## 2017-12-28 ENCOUNTER — Telehealth: Payer: Self-pay | Admitting: Family Medicine

## 2017-12-28 NOTE — Telephone Encounter (Signed)
Dylan Anderson came to office stated patient would like to get a call in regards to the xray taken on his  left hand 12/18/17.  [phone] or [phone]

## 2017-12-28 NOTE — Telephone Encounter (Signed)
Patient is inquiring about results from his xray on his left hand.  Glennie Hawk, CMA

## 2017-12-29 NOTE — Telephone Encounter (Signed)
Called and informed patient of no fracture noted on his DG hand. No further questions.

## 2018-02-05 ENCOUNTER — Ambulatory Visit (INDEPENDENT_AMBULATORY_CARE_PROVIDER_SITE_OTHER): Payer: Self-pay | Admitting: Orthopaedic Surgery

## 2018-02-05 ENCOUNTER — Other Ambulatory Visit (INDEPENDENT_AMBULATORY_CARE_PROVIDER_SITE_OTHER): Payer: Self-pay | Admitting: Radiology

## 2018-02-05 ENCOUNTER — Encounter (INDEPENDENT_AMBULATORY_CARE_PROVIDER_SITE_OTHER): Payer: Self-pay | Admitting: Orthopaedic Surgery

## 2018-02-05 ENCOUNTER — Ambulatory Visit (INDEPENDENT_AMBULATORY_CARE_PROVIDER_SITE_OTHER): Payer: Self-pay

## 2018-02-05 ENCOUNTER — Encounter (INDEPENDENT_AMBULATORY_CARE_PROVIDER_SITE_OTHER): Payer: Self-pay

## 2018-02-05 VITALS — BP 114/56 | HR 77 | Resp 16 | Ht 70.0 in | Wt 147.0 lb

## 2018-02-05 DIAGNOSIS — G8929 Other chronic pain: Secondary | ICD-10-CM

## 2018-02-05 DIAGNOSIS — M25552 Pain in left hip: Secondary | ICD-10-CM

## 2018-02-05 DIAGNOSIS — M25551 Pain in right hip: Secondary | ICD-10-CM

## 2018-02-05 DIAGNOSIS — M5442 Lumbago with sciatica, left side: Secondary | ICD-10-CM

## 2018-02-05 DIAGNOSIS — M5441 Lumbago with sciatica, right side: Secondary | ICD-10-CM

## 2018-02-05 DIAGNOSIS — M542 Cervicalgia: Secondary | ICD-10-CM

## 2018-02-05 DIAGNOSIS — M79605 Pain in left leg: Secondary | ICD-10-CM

## 2018-02-05 DIAGNOSIS — M545 Low back pain, unspecified: Secondary | ICD-10-CM

## 2018-02-05 DIAGNOSIS — M79604 Pain in right leg: Secondary | ICD-10-CM

## 2018-02-05 NOTE — Progress Notes (Signed)
Office Visit Note   Patient: Dylan Anderson           Date of Birth: 08/28/1960           MRN: [id number] Visit Date: 02/05/2018              Requested by: Myrene Buddy, MD [address] [place], [place] [address] PCP: Myrene Buddy, MD   Assessment & Plan: Visit Diagnoses:  1. Neck pain   2. Chronic bilateral low back pain with bilateral sciatica   3. Bilateral hip pain     Plan: Notable complaints.  I think this is the most significant complaints are referable to the hands where he might have carpal tunnel syndrome.  I would suggest EMGs and nerve conduction studies.  Having what appears to be both lower extremities.  Has been evaluated by the vascular service good arterial flow.  Might be worthwhile to consider an MRI scan of the lumbar spine to see if there is neurogenic claudication.  Office visit over 45 minutes 5Mr0% of the time in counseling   Mr Maese does not have any insurance.  We will schedule  an appointment to talk to [facility] regarding financial help before we proceed with any of the above studies  Follow-Up Instructions: Return if symptoms worsen or fail to improve.   Orders:  Orders Placed This Encounter  Procedures  . XR Cervical Spine 2 or 3 views  . XR Lumbar Spine 2-3 Views  . XR Pelvis 1-2 Views   No orders of the defined types were placed in this encounter.     Procedures: No procedures performed   Clinical Data: No additional findings.   Subjective: Chief Complaint  Patient presents with  . Spine - Pain  . Lower Back - Pain  . Neck - Pain  . Right Foot - Pain  . Neck Pain    Neck pain, when lifting right arm over head patient states he "starts shaking like he's having convulsions and gets dizzy" x 2 years, numbness bil hands, no injury, no surgery to neck, limited range of motion, tightness, weakness, numbness  . Back Pain    Back pain x 7 years, no injury, no back surgery, diabetic, Numbness down bil legs and feet, bil calf  tenderness, bil hip pain, not working x 3 months  . Toe Pain    Right great toe pain, gout  Is 57 years old coming by his wife and here for evaluation of multiple complaints.  He has had some chronic problems with the cervical spine and both upper extremities.  He notes that he has had progressive numbness and tingling in both of his hands to the point where he is dropping objects.  He has trouble at night.  He does have a history of diabetes and elevated hemoglobin A1c.  He does smoke at least half pack of cigarettes a day.  He has had significant issues with peripheral vascular disease and is being followed by the vascular service.  He has had a prior iliac stent.  He is had a recent evaluation with "good flow" to both lower extremities.  He also notes that the longer he stands with a further he walks he will start having stiffness soreness and achiness in both of his hips and both of his legs.  He has had some history of back pain no injury or trauma.  Recent evaluation by the vascular service is reviewed.  He has difficulty walking more than a  block or 2 because of discomfort in his lower extremities  HPI  Review of Systems  Constitutional: Positive for fatigue.  HENT: Positive for trouble swallowing.   Eyes: Positive for visual disturbance.  Respiratory: Positive for shortness of breath.   Cardiovascular: Positive for chest pain.  Gastrointestinal: Negative for constipation.  Endocrine: Positive for heat intolerance.  Genitourinary: Positive for difficulty urinating.  Musculoskeletal: Positive for back pain, gait problem, joint swelling, neck pain and neck stiffness.  Skin: Negative for rash.  Allergic/Immunologic: Negative for food allergies.  Neurological: Positive for weakness and numbness.  Hematological: Does not bruise/bleed easily.  Psychiatric/Behavioral: Positive for sleep disturbance.     Objective: Vital Signs: BP (!) 114/56 (BP Location: Left Arm, Patient Position:  Sitting, Cuff Size: Normal)   Pulse 77   Resp 16   Ht 5\' 10"  (1.778 m)   Wt 147 lb (66.7 kg)   BMI 21.09 kg/m   Physical Exam  Constitutional: He is oriented to person, place, and time. He appears well-developed and well-nourished.  HENT:  Mouth/Throat: Oropharynx is clear and moist.  Eyes: Pupils are equal, round, and reactive to light. EOM are normal.  Pulmonary/Chest: Effort normal.  Neurological: He is alert and oriented to person, place, and time.  Skin: Skin is warm and dry.  Psychiatric: He has a normal mood and affect. His behavior is normal.    Ortho Exam awake alert and oriented x3.  Comfortable sitting.  Positive Tinel's and Phalen's over the median nerve of both wrist.  Is able to touch the thumb the little finger.  Some intrinsic atrophy.  Had some degenerative changes in the PIP and MP joints of his left hand more than the right.  Could make a full fist on the right lacks a fingerbreadth of touching the palm of his hand on the right.  Some limitation of motion of the cervical spine lacking a fingerbreadth of touching his chin to her chest.  Had about 70 to 80% of normal rotation of the right to the left.  No referred pain to either upper extremity with any of those motions but he notes that if he abducts his right arm and neck extend he will have some discomfort into his upper extremity Straight leg raise negative.  Some altered sensibility both of his feet from the peripheral neuropathy.  No percussible tenderness of the lumbar spine.  Painless range of motion of both hips and both knees.  Skin intact Specialty Comments:  No specialty comments available.  Imaging: Xr Cervical Spine 2 Or 3 Views  Result Date: 02/05/2018 Films of the cervical spine reveals straightening of the normal lordosis.  There is obvious degenerative change at C5-6 with narrowing of the disc space and anterior osteophyte formation.  No listhesis.  Xr Lumbar Spine 2-3 Views  Result Date:  02/05/2018 Films of the lumbar spine obtained in 2 projections.  The disc spaces are well-maintained.  Some anterior osteophytes.  Very minimal facet sclerosis at L5-S1.  Diffuse calcification of the abdominal aorta without obvious aneurysmal dilatation no evidence of listhesis or curvature.  Evidence of graft in the left iliac artery  Xr Pelvis 1-2 Views  Result Date: 02/05/2018 Pelvic films were negative for any acute changes.  No obvious arthritis of either hip joint.  Sacroiliac joints intact    PMFS History: Patient Active Problem List   Diagnosis Date Noted  . Claudication (HCC) 11/05/2017  . Acute gout involving toe of left foot 09/21/2017  . Chronic cough 07/13/2017  .  Healthcare maintenance 07/12/2017  . Peripheral artery disease (HCC) 06/07/2017  . Hip pain 05/20/2017  . Gastroesophageal reflux disease 04/27/2017  . Hand pain 03/18/2017  . Insomnia 02/06/2017  . Hyperlipidemia 01/25/2017  . Erectile dysfunction 01/25/2017  . Low back pain 01/25/2017  . Diabetes mellitus type II, non insulin dependent (HCC) 07/10/2012  . Hypertension 07/10/2012  . Tobacco abuse 07/10/2012   Past Medical History:  Diagnosis Date  . Arthritis   . Back pain   . Claudication (HCC)   . Erectile dysfunction   . Hypertension 07/10/2012  . PVD (peripheral vascular disease) (HCC)   . Type 2 diabetes mellitus (HCC)     Family History  Problem Relation Age of Onset  . Heart failure Mother   . Osteoarthritis Mother     Past Surgical History:  Procedure Laterality Date  . stent Left    left common and left internal illiac stent   Social History   Occupational History  . Occupation: unemployed  Tobacco Use  . Smoking status: Current Some Day Smoker    Packs/day: 0.50    Years: 17.00    Pack years: 8.50    Types: Cigarettes    Start date: 04/04/1978  . Smokeless tobacco: Never Used  . Tobacco comment: Max 1 ppd  Substance and Sexual Activity  . Alcohol use: Yes    Comment: 2-3  mixed drinks daily  . Drug use: No  . Sexual activity: Yes    Partners: Female

## 2018-02-08 ENCOUNTER — Ambulatory Visit (INDEPENDENT_AMBULATORY_CARE_PROVIDER_SITE_OTHER): Payer: Self-pay | Admitting: Family Medicine

## 2018-02-08 ENCOUNTER — Encounter: Payer: Self-pay | Admitting: Family Medicine

## 2018-02-08 VITALS — BP 167/84 | HR 81 | Temp 97.7°F | Resp 17 | Ht 68.0 in | Wt 147.2 lb

## 2018-02-08 DIAGNOSIS — I10 Essential (primary) hypertension: Secondary | ICD-10-CM

## 2018-02-08 DIAGNOSIS — R0602 Shortness of breath: Secondary | ICD-10-CM

## 2018-02-08 DIAGNOSIS — E7800 Pure hypercholesterolemia, unspecified: Secondary | ICD-10-CM

## 2018-02-08 DIAGNOSIS — Z7689 Persons encountering health services in other specified circumstances: Secondary | ICD-10-CM

## 2018-02-08 DIAGNOSIS — E119 Type 2 diabetes mellitus without complications: Secondary | ICD-10-CM

## 2018-02-08 DIAGNOSIS — R339 Retention of urine, unspecified: Secondary | ICD-10-CM

## 2018-02-08 DIAGNOSIS — R7989 Other specified abnormal findings of blood chemistry: Secondary | ICD-10-CM

## 2018-02-08 MED ORDER — TAMSULOSIN HCL 0.4 MG PO CAPS: 0.4000 mg | ORAL_CAPSULE | Freq: Every day | ORAL | 3 refills | Status: DC

## 2018-02-08 MED ORDER — LOSARTAN POTASSIUM 50 MG PO TABS: 50.0000 mg | ORAL_TABLET | Freq: Every day | ORAL | 0 refills | Status: DC

## 2018-02-08 MED ORDER — GABAPENTIN 300 MG PO CAPS: 300.0000 mg | ORAL_CAPSULE | Freq: Three times a day (TID) | ORAL | 3 refills | Status: DC

## 2018-02-08 MED ORDER — ATORVASTATIN CALCIUM 40 MG PO TABS: 40.0000 mg | ORAL_TABLET | Freq: Every day | ORAL | 0 refills | Status: DC

## 2018-02-08 MED ORDER — ALBUTEROL SULFATE HFA 108 (90 BASE) MCG/ACT IN AERS: 2.0000 | INHALATION_SPRAY | RESPIRATORY_TRACT | 0 refills | Status: DC | PRN

## 2018-02-08 NOTE — Progress Notes (Signed)
Dylan Anderson, is a 57 y.o. male  ZOX:[id number]  [id number]:[id number]  DOB - 12/30/1960  CC:  Chief Complaint  Patient presents with  . Establish Care  . Diabetes    readings at home have been in the 270s-300s. has numbness/tingling in feet, polyuria & polydipsia. no nausea, vomiting,  . Hypertension    doesn't check BP at home. has some SHOB & headaches  . Hyperlipidemia       HPI: Dylan Anderson is a 57 y.o. male is here today to establish care.   Dylan Anderson has Diabetes mellitus type II, non insulin dependent (HCC); Hypertension; Tobacco abuse; Hyperlipidemia; Erectile dysfunction; Low back pain; Insomnia; Hand pain; Gastroesophageal reflux disease; Hip pain; Peripheral artery disease (HCC); Healthcare maintenance; Chronic cough; Acute gout involving toe of left foot; and Claudication (HCC) on their problem list.    Today's visit:  Here to establish care today.  Type 2 diabetes Dylan Anderson  monitor glucose at home. Adheres to current medication regimen. Reports associated foot and leg pain, which persists in spite of previously being prescribed Gabapentin. Last prescription for Gabapentin 100 mg twice daily and admits he did not take very long as he achieve no relief. He complains of  urinary frequency and urgency. No known history of BPH. Denies  visual disturbances,hypoglycemia or hyperglycemia. Engages in no routine exercise and nor adheres to a strict diabetes diet.  Hypertension  Reports no home monitoring of blood pressure. Normally adherent with blood pressure medications, however has been without due to no PCP. Reports efforts to adhere to low sodium diet. He  is a current daily smoker. Denies any episodes of dizziness or  chest pain. Complains of occasional headaches which relieve with rest and shortness of breath. No prior diagnosis of COPD. Longstanding history of heavy 1-1.5 pack per day smoking.   Patient denies new headaches, chest pain, abdominal pain, nausea, new weakness ,  numbness or tingling, SOB, edema, or worrisome cough.     Current medications: Current Outpatient Medications:  .  aspirin EC 81 MG tablet, Take 1 tablet (81 mg total) by mouth daily. (Patient taking differently: Take 325 mg by mouth daily. ), Disp: , Rfl:  .  losartan (COZAAR) 50 MG tablet, Take 1 tablet (50 mg total) by mouth daily., Disp: 90 tablet, Rfl: 0 .  metFORMIN (GLUCOPHAGE) 500 MG tablet, Take 2 tablets (1,000 mg total) by mouth 2 (two) times daily with a meal., Disp: 120 tablet, Rfl: 11 .  Multiple Vitamin (MULTIVITAMIN WITH MINERALS) TABS tablet, Take 1 tablet by mouth daily., Disp: , Rfl:  .  pantoprazole (PROTONIX) 20 MG tablet, Take 1 tablet (20 mg total) by mouth daily., Disp: 30 tablet, Rfl: 8 .  albuterol (PROVENTIL HFA;VENTOLIN HFA) 108 (90 Base) MCG/ACT inhaler, Inhale 2 puffs into the lungs every 4 (four) hours as needed for wheezing or shortness of breath., Disp: 1 Inhaler, Rfl: 0 .  atorvastatin (LIPITOR) 40 MG tablet, Take 1 tablet (40 mg total) by mouth daily., Disp: 90 tablet, Rfl: 0 .  gabapentin (NEURONTIN) 300 MG capsule, Take 1 capsule (300 mg total) by mouth 3 (three) times daily., Disp: 90 capsule, Rfl: 3   Pertinent family medical history: family history includes Heart failure in his mother; Osteoarthritis in his mother.   No Known Allergies  Social History   Socioeconomic History  . Marital status: Married    Spouse name: Not on file  . Number of children: Not on file  . Years of education: Not on file  .  Highest education level: Not on file  Occupational History  . Occupation: unemployed  Social Needs  . Financial resource strain: Not on file  . Food insecurity:    Worry: Not on file    Inability: Not on file  . Transportation needs:    Medical: Not on file    Non-medical: Not on file  Tobacco Use  . Smoking status: Current Some Day Smoker    Packs/day: 0.50    Years: 17.00    Pack years: 8.50    Types: Cigarettes    Start date: 04/04/1978   . Smokeless tobacco: Never Used  . Tobacco comment: Max 1 ppd  Substance and Sexual Activity  . Alcohol use: Yes    Comment: 2-3 mixed drinks daily  . Drug use: No  . Sexual activity: Yes    Partners: Female  Lifestyle  . Physical activity:    Days per week: Not on file    Minutes per session: Not on file  . Stress: Not on file  Relationships  . Social connections:    Talks on phone: Not on file    Gets together: Not on file    Attends religious service: Not on file    Active member of club or organization: Not on file    Attends meetings of clubs or organizations: Not on file    Relationship status: Not on file  . Intimate partner violence:    Fear of current or ex partner: Not on file    Emotionally abused: Not on file    Physically abused: Not on file    Forced sexual activity: Not on file  Other Topics Concern  . Not on file  Social History Narrative  . Not on file    Review of Systems: Constitutional: Negative for fever, chills, diaphoresis, activity change, appetite change and fatigue. Eyes: Negative for pain, discharge, redness, itching and visual disturbance. Respiratory: chronic cough and  shortness of breath. Denies wheezing and stridor.  Cardiovascular: Negative for chest pain, palpitations and leg swelling. Gastrointestinal: Negative for abdominal distention. Genitourinary: complains of urgency, retention, and frequency. Musculoskeletal: Negative for back pain, joint swelling, arthralgia and gait problem. Neurological: Negative for dizziness, tremors, seizures, syncope, facial asymmetry, speech difficulty, weakness, light-headedness, numbness and headaches.  Hematological: Negative for adenopathy. Does not bruise/bleed easily. Psychiatric/Behavioral: Negative for hallucinations, behavioral problems, confusion, dysphoric mood, decreased concentration and agitation.    Objective:   Vitals:   02/08/18 0853  BP: (!) 167/84  Pulse: 81  Resp: 17  Temp:  97.7 F (36.5 C)  SpO2: 98%    BP Readings from Last 3 Encounters:  02/08/18 (!) 167/84  02/05/18 (!) 114/56  12/18/17 137/78    Filed Weights   02/08/18 0853  Weight: 147 lb 3.2 oz (66.8 kg)      Physical Exam: Constitutional: Patient appears well-developed and well-nourished. No distress. HENT: Normocephalic, atraumatic, External right and left ear normal. Oropharynx is clear and moist.  Eyes: Conjunctivae and EOM are normal. PERRLA, no scleral icterus. Neck: Normal ROM. Neck supple. No JVD. No tracheal deviation. No thyromegaly. CVS: RRR, S1/S2 +, no murmurs, no gallops, no carotid bruit.  Pulmonary: Effort and breath sounds normal, no stridor, rhonchi, wheezes, rales.  Abdominal: Soft. BS +, no distension, tenderness, rebound or guarding.  Musculoskeletal: Normal range of motion. No edema and no tenderness.  Neuro: Alert. Normal muscle tone coordination. Normal gait.  Skin: Skin is warm and dry. No rash noted. Not diaphoretic. No erythema. No pallor.  Psychiatric: Normal mood and affect. Behavior, judgment, thought content normal.  Lab Results (prior encounters)  Lab Results  Component Value Date   WBC 8.0 07/10/2012   HGB 14.9 07/10/2012   HCT 42.6 07/10/2012   MCV 83.5 07/10/2012   PLT 325 07/10/2012   Lab Results  Component Value Date   CREATININE 0.83 01/13/2017   BUN 13 01/13/2017   NA 141 01/13/2017   K 4.8 01/13/2017   CL 101 01/13/2017   CO2 25 01/13/2017    Lab Results  Component Value Date   HGBA1C 8.6 (A) 11/02/2017       Component Value Date/Time   CHOL 216 (H) 01/13/2017 1610   TRIG 83 01/13/2017 1610   HDL 50 01/13/2017 1610   CHOLHDL 4.3 01/13/2017 1610   CHOLHDL 5.7 07/10/2012 1301   VLDL 36 07/10/2012 1301   LDLCALC 149 (H) 01/13/2017 1610        Assessment and plan:  1. Encounter to establish care  2. Diabetes mellitus type II, non insulin dependent (HCC) Last A1C uncontrolled at 8.1.  Will repeat Hemoglobin A1C today. If  remains elevated will add glipizide 5 mg once daily. Checking  comprehensive metabolic panel to evaluate renal function. Aim for 30 minutes of exercise most days, with a goal of 150 minutes per week. -Glucose monitoring at minimal of twice daily and keep a log of readings. -Commit to medication adherence and self-adjustment as needed -increase foods containing whole grains (one-half of grain intake). -saturated fat intake should be reduced -reduce intake of trans fat (lowers LDL cholesterol and increases HDL cholesterol) -Eat 4-5 small meals during the day to reduce the risk of becoming hungry.   3. Essential hypertension, uncontrolled today. w/o medication today Resume losartan We have discussed target BP range and blood pressure goal. I have advised patient to check BP regularly and to call us back or report to clinic if the numbers are consistently higher than 140/90. We discussed the importance of compliance with medical therapy and DASH diet recommended, consequences of uncontrolled hypertension discussed.    4. Pure hypercholesterolemia - Lipid panel; Standing  5. Low vitamin D level - VITAMIN D 25 Hydroxy (Vit-D Deficiency, Fractures); Future  6. Urinary retention Check  PSA Trial Flomax 0.4 daily.  Once financial assistance approved, will refer to urology for further evaluation   7. SOB (shortness of breath), suspect COPD  - DG Chest 2 View; Future   Meds ordered this encounter  Medications  . atorvastatin (LIPITOR) 40 MG tablet    Sig: Take 1 tablet (40 mg total) by mouth daily.    Dispense:  90 tablet    Refill:  0  . losartan (COZAAR) 50 MG tablet    Sig: Take 1 tablet (50 mg total) by mouth daily.    Dispense:  90 tablet    Refill:  0  . albuterol (PROVENTIL HFA;VENTOLIN HFA) 108 (90 Base) MCG/ACT inhaler    Sig: Inhale 2 puffs into the lungs every 4 (four) hours as needed for wheezing or shortness of breath.    Dispense:  1 Inhaler    Refill:  0  . gabapentin  (NEURONTIN) 300 MG capsule    Sig: Take 1 capsule (300 mg total) by mouth 3 (three) times daily.    Dispense:  90 capsule    Refill:  3  . tamsulosin (FLOMAX) 0.4 MG CAPS capsule    Sig: Take 1 capsule (0.4 mg total) by mouth daily.    Dispense:  30 capsule    Refill:  3    A total of 45 minutes spent, greater than 50 % of this time was spent counseling and coordination of care.   Return in about 3 months (around 05/11/2018) for Schedule 11/15 chest x-ray and 3 months for  chronic condition follow-up.   The patient was given clear instructions to go to ER or return to medical center if symptoms don't improve, worsen or new problems develop. The patient verbalized understanding. The patient was advised  to call and obtain lab results if they haven't heard anything from out office within 7-10 business days.  Joaquin Courts, FNP [facility] [address], [place] [address] [phone]: [phone]    This note has been created with Dragon speech recognition software and Paediatric nurse. Any transcriptional errors are unintentional.

## 2018-02-08 NOTE — Patient Instructions (Addendum)
Medications have been refilled and e-prescribed to the pharmacy that you specifically requested.  Schedule a chest x-ray in for Friday 02/16/2018.   Return for follow-up in 3 months.   You will be notified if labs are concerning.    Thank you for choosing [facility] at [facility] to be your medical home!    Dylan Anderson was seen by Joaquin Courts, FNP today.   Caprice Renshaw primary care provider is Bing Neighbors, FNP.   For the best care possible, you should try to see Joaquin Courts, FNP-C whenever you come to the clinic.   We look forward to seeing you again soon!  If you have any questions about your visit today, please call us at [phone] or feel free to reach your primary care provider via MyChart.      Acute Urinary Retention, Male Acute urinary retention is when you are unable to pee (urinate). Acute urinary retention is common in older men. Prostates can get bigger, which blocks the flow of pee. Follow these instructions at home:  Drink enough fluids to keep your pee clear or pale yellow.  If you are sent home with a tube that drains the bladder (catheter), there will be a drainage bag attached to it. There are two types of bags. One is big that you can wear at night without having to empty it. One is smaller and needs to be emptied more often. ? Keep the drainage bag empty. ? Keep the drainage bag lower than your catheter.  Only take medicine as told by your doctor. Contact a doctor if:  You have a low-grade fever.  You have spasms or you are leaking pee when you have spasms. Get help right away if:  You have chills or a fever.  Your catheter stops draining pee.  Your catheter falls out.  You have increased bleeding that does not stop after you have rested and increased the amount of fluids you had been drinking. This information is not intended to replace advice given to you by your health care provider. Make sure you discuss any questions  you have with your health care provider. Document Released: 09/07/2007 Document Revised: 08/27/2015 Document Reviewed: 08/30/2012 Elsevier Interactive Patient Education  2017 [employer].

## 2018-02-09 LAB — COMPREHENSIVE METABOLIC PANEL
A/G RATIO: 1.7 (ref 1.2–2.2)
ALBUMIN: 4.4 g/dL (ref 3.5–5.5)
ALT: 13 IU/L (ref 0–44)
AST: 16 IU/L (ref 0–40)
Alkaline Phosphatase: 85 IU/L (ref 39–117)
BUN/Creatinine Ratio: 20 (ref 9–20)
BUN: 20 mg/dL (ref 6–24)
Bilirubin Total: 0.6 mg/dL (ref 0.0–1.2)
CALCIUM: 10.2 mg/dL (ref 8.7–10.2)
CO2: 24 mmol/L (ref 20–29)
Chloride: 99 mmol/L (ref 96–106)
Creatinine, Ser: 1.01 mg/dL (ref 0.76–1.27)
GFR, EST AFRICAN AMERICAN: 96 mL/min/{1.73_m2} (ref >59)
GFR, EST NON AFRICAN AMERICAN: 83 mL/min/{1.73_m2} (ref >59)
GLOBULIN, TOTAL: 2.6 g/dL (ref 1.5–4.5)
Glucose: 193 mg/dL — ABNORMAL HIGH (ref 65–99)
POTASSIUM: 5.1 mmol/L (ref 3.5–5.2)
SODIUM: 137 mmol/L (ref 134–144)
TOTAL PROTEIN: 7 g/dL (ref 6.0–8.5)

## 2018-02-09 LAB — HEMOGLOBIN A1C
Est. average glucose Bld gHb Est-mCnc: 200 mg/dL
HEMOGLOBIN A1C: 8.6 % — AB (ref 4.8–5.6)

## 2018-02-09 LAB — LIPID PANEL
Chol/HDL Ratio: 5.2 ratio — ABNORMAL HIGH (ref 0.0–5.0)
Cholesterol, Total: 232 mg/dL — ABNORMAL HIGH (ref 100–199)
HDL: 45 mg/dL (ref >39)
LDL Calculated: 156 mg/dL — ABNORMAL HIGH (ref 0–99)
Triglycerides: 155 mg/dL — ABNORMAL HIGH (ref 0–149)
VLDL CHOLESTEROL CAL: 31 mg/dL (ref 5–40)

## 2018-02-09 LAB — VITAMIN D 25 HYDROXY (VIT D DEFICIENCY, FRACTURES): VIT D 25 HYDROXY: 30.3 ng/mL (ref 30.0–100.0)

## 2018-02-09 LAB — PSA: Prostate Specific Ag, Serum: 1.1 ng/mL (ref 0.0–4.0)

## 2018-02-13 MED ORDER — GLIPIZIDE 5 MG PO TABS: 5.0000 mg | ORAL_TABLET | Freq: Every day | ORAL | 3 refills | Status: DC

## 2018-02-13 MED ORDER — ATORVASTATIN CALCIUM 40 MG PO TABS: 80.0000 mg | ORAL_TABLET | Freq: Every day | ORAL | 2 refills | Status: DC

## 2018-02-16 ENCOUNTER — Ambulatory Visit: Payer: Self-pay

## 2018-02-19 ENCOUNTER — Ambulatory Visit
Admission: RE | Admit: 2018-02-19 | Discharge: 2018-02-19 | Disposition: A | Discharge status: Home / Self Care | Payer: Self-pay | Source: Ambulatory Visit | Attending: Orthopaedic Surgery | Admitting: Orthopaedic Surgery

## 2018-02-19 DIAGNOSIS — M545 Low back pain, unspecified: Secondary | ICD-10-CM

## 2018-02-19 DIAGNOSIS — G8929 Other chronic pain: Secondary | ICD-10-CM

## 2018-02-20 ENCOUNTER — Encounter (INDEPENDENT_AMBULATORY_CARE_PROVIDER_SITE_OTHER): Payer: Self-pay | Admitting: Physical Medicine and Rehabilitation

## 2018-03-04 ENCOUNTER — Other Ambulatory Visit: Payer: Self-pay | Admitting: Family Medicine

## 2018-03-06 ENCOUNTER — Ambulatory Visit (INDEPENDENT_AMBULATORY_CARE_PROVIDER_SITE_OTHER): Payer: Self-pay | Admitting: Orthopaedic Surgery

## 2018-03-06 ENCOUNTER — Encounter (INDEPENDENT_AMBULATORY_CARE_PROVIDER_SITE_OTHER): Payer: Self-pay | Admitting: Orthopaedic Surgery

## 2018-03-06 VITALS — Resp 16 | Ht 68.0 in | Wt 147.0 lb

## 2018-03-06 DIAGNOSIS — G8929 Other chronic pain: Secondary | ICD-10-CM

## 2018-03-06 DIAGNOSIS — M545 Low back pain: Secondary | ICD-10-CM

## 2018-03-06 NOTE — Patient Instructions (Signed)

## 2018-03-06 NOTE — Progress Notes (Signed)
Office Visit Note   Patient: Dylan Anderson           Date of Birth: 07/14/1960           MRN: [id number] Visit Date: 03/06/2018              Requested by: Bing Neighbors, FNP [address] [place], [place] [address] PCP: Bing Neighbors, FNP   Assessment & Plan: Visit Diagnoses:  1. Chronic bilateral low back pain without sciatica     Plan: MRI scan of lumbar spine demonstrates several levels of spondylosis without nerve root impingement the findings were described as "minimal".  Jacob's does have back pain with little if any leg pain.  Long discussion regarding the MRI scan findings.  We will start with exercises as he prefer not to go to therapy and consider NSAIDs.  Return to the office in the next 6 to 8 weeks if no improvement  Follow-Up Instructions: Return if symptoms worsen or fail to improve.   Orders:  No orders of the defined types were placed in this encounter.  No orders of the defined types were placed in this encounter.     Procedures: No procedures performed   Clinical Data: No additional findings.   Subjective: No chief complaint on file. Returns for evaluation of the MRI scan.  Scan demonstrates several levels of mild spondylosis of lumbar spine without evidence of any nerve root compression.  No change in symptoms  HPI  Review of Systems   Objective: Vital Signs: There were no vitals taken for this visit.  Physical Exam  Constitutional: He is oriented to person, place, and time. He appears well-developed and well-nourished.  HENT:  Mouth/Throat: Oropharynx is clear and moist.  Eyes: Pupils are equal, round, and reactive to light. EOM are normal.  Pulmonary/Chest: Effort normal.  Neurological: He is alert and oriented to person, place, and time.  Skin: Skin is warm and dry.  Psychiatric: He has a normal mood and affect. His behavior is normal.    Ortho Exam awake alert and oriented x3.  Comfortable sitting.  Straight leg  raise negative.  No flank pain to percussion.  Painless range of motion both hips and both knees.  Motor exam intact.  Very minimal percussible tenderness of lumbar spine.  Specialty Comments:  No specialty comments available.  Imaging: No results found.   PMFS History: Patient Active Problem List   Diagnosis Date Noted  . Claudication (HCC) 11/05/2017  . Acute gout involving toe of left foot 09/21/2017  . Chronic cough 07/13/2017  . Healthcare maintenance 07/12/2017  . Peripheral artery disease (HCC) 06/07/2017  . Hip pain 05/20/2017  . Gastroesophageal reflux disease 04/27/2017  . Hand pain 03/18/2017  . Insomnia 02/06/2017  . Hyperlipidemia 01/25/2017  . Erectile dysfunction 01/25/2017  . Low back pain 01/25/2017  . Diabetes mellitus type II, non insulin dependent (HCC) 07/10/2012  . Hypertension 07/10/2012  . Tobacco abuse 07/10/2012   Past Medical History:  Diagnosis Date  . Arthritis   . Back pain   . Claudication (HCC)   . Erectile dysfunction   . Hypertension 07/10/2012  . PVD (peripheral vascular disease) (HCC)   . Type 2 diabetes mellitus (HCC)     Family History  Problem Relation Age of Onset  . Heart failure Mother   . Osteoarthritis Mother     Past Surgical History:  Procedure Laterality Date  . stent Left    left common and left internal  illiac stent   Social History   Occupational History  . Occupation: unemployed  Tobacco Use  . Smoking status: Current Some Day Smoker    Packs/day: 0.50    Years: 17.00    Pack years: 8.50    Types: Cigarettes    Start date: 04/04/1978  . Smokeless tobacco: Never Used  . Tobacco comment: Max 1 ppd  Substance and Sexual Activity  . Alcohol use: Yes    Comment: 2-3 mixed drinks daily  . Drug use: No  . Sexual activity: Yes    Partners: Female     Valeria BatmanPeter W Shaeley Segall, MD   Note - This record has been created using [employer] software.  Chart creation errors have been sought, but may not always  have been  located. Such creation errors do not reflect on  the standard of medical care.

## 2018-03-09 NOTE — Progress Notes (Signed)
Patient notified of results & recommendations. Expressed understanding.

## 2018-03-21 ENCOUNTER — Other Ambulatory Visit: Payer: Self-pay | Admitting: Family Medicine

## 2018-05-11 ENCOUNTER — Ambulatory Visit: Payer: Self-pay

## 2018-05-11 ENCOUNTER — Ambulatory Visit (INDEPENDENT_AMBULATORY_CARE_PROVIDER_SITE_OTHER): Payer: Self-pay | Admitting: Family Medicine

## 2018-05-11 ENCOUNTER — Encounter: Payer: Self-pay | Admitting: Family Medicine

## 2018-05-11 VITALS — BP 167/90 | HR 77 | Resp 17 | Ht 68.0 in | Wt 159.4 lb

## 2018-05-11 DIAGNOSIS — R0602 Shortness of breath: Secondary | ICD-10-CM

## 2018-05-11 DIAGNOSIS — E1165 Type 2 diabetes mellitus with hyperglycemia: Secondary | ICD-10-CM

## 2018-05-11 DIAGNOSIS — E785 Hyperlipidemia, unspecified: Secondary | ICD-10-CM

## 2018-05-11 DIAGNOSIS — E119 Type 2 diabetes mellitus without complications: Secondary | ICD-10-CM

## 2018-05-11 DIAGNOSIS — R079 Chest pain, unspecified: Secondary | ICD-10-CM

## 2018-05-11 DIAGNOSIS — I10 Essential (primary) hypertension: Secondary | ICD-10-CM

## 2018-05-11 DIAGNOSIS — R739 Hyperglycemia, unspecified: Secondary | ICD-10-CM

## 2018-05-11 LAB — GLUCOSE, POCT (MANUAL RESULT ENTRY): POC GLUCOSE: 290 mg/dL — AB (ref 70–99)

## 2018-05-11 LAB — POCT CBG (FASTING - GLUCOSE)-MANUAL ENTRY: Glucose Fasting, POC: 251 mg/dL — AB (ref 70–99)

## 2018-05-11 MED ORDER — GLIPIZIDE 5 MG PO TABS: 5.0000 mg | ORAL_TABLET | Freq: Two times a day (BID) | ORAL | 3 refills | Status: DC

## 2018-05-11 MED ORDER — PANTOPRAZOLE SODIUM 20 MG PO TBEC: 20.0000 mg | DELAYED_RELEASE_TABLET | Freq: Every day | ORAL | 8 refills | Status: DC

## 2018-05-11 MED ORDER — GABAPENTIN 300 MG PO CAPS: 300.0000 mg | ORAL_CAPSULE | Freq: Three times a day (TID) | ORAL | 3 refills | Status: DC

## 2018-05-11 NOTE — Progress Notes (Signed)
Established Patient Office Visit  Subjective:  Patient ID: Dylan Anderson, male    DOB: Nov 28, 1960  Age: 58 y.o. MRN: [id number]  CC:  Chief Complaint  Patient presents with  . Diabetes  . Hypertension  . Hyperlipidemia    HPI [name] presents for diabetes, hypertension, and chronic shortness of breath.  Hypertension uncontrolled with Chest Pain  Complains of months of intermittent mediastinal chest pain which radiates right and left side of chest. Patient has severe peripheral artery disease. He has had a history of chest pain and palpitations. He underwent and extensive cardiac work-up including wearing a Holter monitor while receiving care at [facility] over 8 years ago. No history of angina. Blood pressure has remained poorly controlled during office visits, however patient reports never taking medication prior to office visits. He is a chronic daily smoker with a pack per day habit. He reports he is trying to quit by cutting back. He is currently prescribed Losartan 50 mg once daily. He is uninsured and has been hesitant to utilize [employer] Endorses that he is currently able to afford most medications through [facility]. He was prescribed an albuterol inhaler for chronic SOB however cost at commercial pharmacy remains to high to pay out of pocket. He continues to endorse daily shortness of breath with exertion and at rest.   Diabetes: Poorly controlled for sometime. Last A1C 8.6 in November. Reports home readings continue to average between 200-220.  Endorses a few episodes of hypoglycemia followed by insulin since and when she is taken glipizide without food.  He monitors blood sugars almost at least once daily.  He is uninsured and was previously managed on both long-acting and short acting insulin.  He has been resistant to using the [facility] to obtain affordable prescriptions for previously prescribed insulin  regimen.  He has not achieved desired control with oral anti-diabetes medications.  Currently prescribed glipizide 5 mg once daily and metformin 1000 mg twice daily. Body mass index is 24.24 kg/m.  Remains inactive of routine physical activity. Past Medical History:  Diagnosis Date  . Arthritis   . Back pain   . Claudication (HCC)   . Erectile dysfunction   . Hypertension 07/10/2012  . PVD (peripheral vascular disease) (HCC)   . Type 2 diabetes mellitus (HCC)     Past Surgical History:  Procedure Laterality Date  . stent Left    left common and left internal illiac stent    Family History  Problem Relation Age of Onset  . Heart failure Mother   . Osteoarthritis Mother     Social History   Socioeconomic History  . Marital status: Married    Spouse name: Not on file  . Number of children: Not on file  . Years of education: Not on file  . Highest education level: Not on file  Occupational History  . Occupation: unemployed  Social Needs  . Financial resource strain: Not on file  . Food insecurity:    Worry: Not on file    Inability: Not on file  . Transportation needs:    Medical: Not on file    Non-medical: Not on file  Tobacco Use  . Smoking status: Current Some Day Smoker    Packs/day: 0.50    Years: 17.00    Pack years: 8.50    Types: Cigarettes    Start date: 04/04/1978  . Smokeless tobacco: Never Used  . Tobacco comment: Max 1 ppd  Substance and Sexual Activity  . Alcohol use: Yes    Comment: 2-3 mixed drinks daily  . Drug use: No  . Sexual activity: Yes    Partners: Female  Lifestyle  . Physical activity:    Days per week: Not on file    Minutes per session: Not on file  . Stress: Not on file  Relationships  . Social connections:    Talks on phone: Not on file    Gets together: Not on file    Attends religious service: Not on file    Active member of club or organization: Not on file    Attends meetings of clubs or organizations: Not on file     Relationship status: Not on file  . Intimate partner violence:    Fear of current or ex partner: Not on file    Emotionally abused: Not on file    Physically abused: Not on file    Forced sexual activity: Not on file  Other Topics Concern  . Not on file  Social History Narrative  . Not on file    Outpatient Medications Prior to Visit  Medication Sig Dispense Refill  . aspirin 325 MG tablet Take 325 mg by mouth daily.    [name] atorvastatin (LIPITOR) 40 MG tablet Take 2 tablets (80 mg total) by mouth daily at 6 PM. 60 tablet 2  . gabapentin (NEURONTIN) 300 MG capsule Take 1 capsule (300 mg total) by mouth 3 (three) times daily. 90 capsule 3  . glipiZIDE (GLUCOTROL) 5 MG tablet Take 1 tablet (5 mg total) by mouth daily before breakfast. 60 tablet 3  . losartan (COZAAR) 50 MG tablet TAKE 1 TABLET BY MOUTH ONCE DAILY 90 tablet 1  . metFORMIN (GLUCOPHAGE) 500 MG tablet Take 2 tablets (1,000 mg total) by mouth 2 (two) times daily with a meal. 120 tablet 11  . Multiple Vitamin (MULTIVITAMIN WITH MINERALS) TABS tablet Take 1 tablet by mouth daily.    . pantoprazole (PROTONIX) 20 MG tablet Take 1 tablet (20 mg total) by mouth daily. 30 tablet 8  . tamsulosin (FLOMAX) 0.4 MG CAPS capsule Take 1 capsule (0.4 mg total) by mouth daily. 30 capsule 3  . albuterol (PROVENTIL HFA;VENTOLIN HFA) 108 (90 Base) MCG/ACT inhaler Inhale 2 puffs into the lungs every 4 (four) hours as needed for wheezing or shortness of breath. 1 Inhaler 0  . aspirin EC 81 MG tablet Take 1 tablet (81 mg total) by mouth daily. (Patient taking differently: Take 325 mg by mouth daily. )     No facility-administered medications prior to visit.     No Known Allergies  ROS Review of Systems Pertinent negatives listed in HPI   Objective:    Physical Exam BP (!) 167/90   Pulse 77   Resp 17   Ht 5\' 8"  (1.727 m)   Wt 159 lb 6.4 oz (72.3 kg)   SpO2 96%   BMI 24.24 kg/m    Physical Exam: Constitutional: Patient appears  well-developed and well-nourished. No distress. HENT: Normocephalic, atraumatic, External right and left ear normal.  Eyes: Conjunctivae and EOM are normal. PERRLA, no scleral icterus. Neck: Normal ROM. Neck supple. No JVD. No tracheal deviation. No thyromegaly. CVS: RRR, S1/S2 +, no murmurs, no gallops, no carotid bruit.  Pulmonary: Increased effort and positive rhonchi. Persistent non-productive cough Abdominal: Soft. BS +, no distension, tenderness, rebound or guarding.  Musculoskeletal: Normal range of motion. No edema and no tenderness.  Neuro: Alert. Normal reflexes, muscle  tone coordination. No cranial nerve deficit. Skin: Skin is warm and dry. No rash noted. Not diaphoretic. No erythema. No pallor. Psychiatric: Normal mood and affect. Behavior, judgment, thought content normal.   Wt Readings from Last 3 Encounters:  05/11/18 159 lb 6.4 oz (72.3 kg)  03/06/18 147 lb (66.7 kg)  02/08/18 147 lb 3.2 oz (66.8 kg)     Health Maintenance Due  Topic Date Due  . Hepatitis C Screening  02-Apr-1961  . PNEUMOCOCCAL POLYSACCHARIDE VACCINE AGE 54-64 HIGH RISK  02/19/1964  . OPHTHALMOLOGY EXAM  02/19/1972  . HIV Screening  02/18/1977  . TETANUS/TDAP  02/18/1981  . COLONOSCOPY  02/19/2012  . FOOT EXAM  02/02/2018    There are no preventive care reminders to display for this patient.  Lab Results  Component Value Date   TSH 0.419 07/10/2012   Lab Results  Component Value Date   WBC 8.0 07/10/2012   HGB 14.9 07/10/2012   HCT 42.6 07/10/2012   MCV 83.5 07/10/2012   PLT 325 07/10/2012   Lab Results  Component Value Date   NA 137 02/08/2018   K 5.1 02/08/2018   CO2 24 02/08/2018   GLUCOSE 193 (H) 02/08/2018   BUN 20 02/08/2018   CREATININE 1.01 02/08/2018   BILITOT 0.6 02/08/2018   ALKPHOS 85 02/08/2018   AST 16 02/08/2018   ALT 13 02/08/2018   PROT 7.0 02/08/2018   ALBUMIN 4.4 02/08/2018   CALCIUM 10.2 02/08/2018   Lab Results  Component Value Date   CHOL 232 (H)  02/08/2018   Lab Results  Component Value Date   HDL 45 02/08/2018   Lab Results  Component Value Date   LDLCALC 156 (H) 02/08/2018   Lab Results  Component Value Date   TRIG 155 (H) 02/08/2018   Lab Results  Component Value Date   CHOLHDL 5.2 (H) 02/08/2018   Lab Results  Component Value Date   HGBA1C 8.6 (H) 02/08/2018      Assessment & Plan:  1. Diabetes mellitus type II, non insulin dependent (HCC) Increased Glipizide 5 mg twice daily with food. Reinforced the importance of taking medication with a full meal twice daily. Check blood sugar prior to bedtime to ensure blood sugar is not low. - Glucose (CBG), 290 today uncontrolled, fasting 10 units of NovoLog given here in office with a repeat blood sugar 20 minutes later at 251. Checking  - Microalbumin/Creatinine Ratio, Urine - Glucose (CBG), Fasting  Patient is accompanied today by girlfriend.  Had a long extensive talk if A1c has not significantly improved with increase in the glipizide and metformin patient will need to resume insulin.  Explained extensively the benefits of using [facility] medications will be affordable and we can better manage his diabetes.     2. Shortness of breath - DG Chest 2 View; Future, rule out COPD related changes  Patient was previously prescribed albuterol inhaler however was [facility]  Encouraged once again to utilize [facility] pharmacy in order to get high cost medications at an affordable price.  3. Essential hypertension Remains uncontrolled.  Patient has not taken any medication today prior to visit. Explained again the importance of taking medication prior to office visit as a need to evaluate whether medication is effective or not.  Have increase losartan to 100 mg daily in order to improve blood pressure control.  4. Hyperglycemia, 290 fasting on arrival  Verbal order  10  units of regular  insulin given subcutaneously  - Hemoglobin A1c, checking today.  Repeat glucose 251  5. Hyperlipidemia, unspecified hyperlipidemia type Continue atorvastatin.  Obtaining a fasting lipid panel today  6. Chest pain, unspecified type - EKG 12-Lead, nonspecific T wave sinus rhythm Given a peripheral vascular disease, diabetes, uncontrolled hypertension, and a history of chest pain will refer emergently to cardiology for further work-up and evaluation to rule out possible CAD. - Ambulatory referral to Cardiology  Financial assistance application given to patient.  A listing of all medications and what the medications are taking for was also given to the patient in order to promote better adherence to medication as a suspect one cause of persistently elevated blood pressure readings is inconsistent medication adherence.   Meds ordered this encounter  Medications  . gabapentin (NEURONTIN) 300 MG capsule    Sig: Take 1 capsule (300 mg total) by mouth 3 (three) times daily.    Dispense:  90 capsule    Refill:  3  . glipiZIDE (GLUCOTROL) 5 MG tablet    Sig: Take 1 tablet (5 mg total) by mouth 2 (two) times daily before a meal.    Dispense:  60 tablet    Refill:  3  . pantoprazole (PROTONIX) 20 MG tablet    Sig: Take 1 tablet (20 mg total) by mouth daily.    Dispense:  30 tablet    Refill:  8  . losartan (COZAAR) 50 MG tablet    Sig: Take 2 tablets (100 mg total) by mouth daily.    Dispense:  180 tablet    Refill:  1    Follow-up: Follow-up 4 weeks for blood pressure follow-up     Joaquin CourtsKimberly Teng Decou, FNP

## 2018-05-11 NOTE — Patient Instructions (Signed)
Diabetes Mellitus and Nutrition, Adult When you have diabetes (diabetes mellitus), it is very important to have healthy eating habits because your blood sugar (glucose) levels are greatly affected by what you eat and drink. Eating healthy foods in the appropriate amounts, at about the same times every day, can help you:  Control your blood glucose.  Lower your risk of heart disease.  Improve your blood pressure.  Reach or maintain a healthy weight. Every person with diabetes is different, and each person has different needs for a meal plan. Your health care provider may recommend that you work with a diet and nutrition specialist (dietitian) to make a meal plan that is best for you. Your meal plan may vary depending on factors such as:  The calories you need.  The medicines you take.  Your weight.  Your blood glucose, blood pressure, and cholesterol levels.  Your activity level.  Other health conditions you have, such as heart or kidney disease. How do carbohydrates affect me? Carbohydrates, also called carbs, affect your blood glucose level more than any other type of food. Eating carbs naturally raises the amount of glucose in your blood. Carb counting is a method for keeping track of how many carbs you eat. Counting carbs is important to keep your blood glucose at a healthy level, especially if you use insulin or take certain oral diabetes medicines. It is important to know how many carbs you can safely have in each meal. This is different for every person. Your dietitian can help you calculate how many carbs you should have at each meal and for each snack. Foods that contain carbs include:  Bread, cereal, rice, pasta, and crackers.  Potatoes and corn.  Peas, beans, and lentils.  Milk and yogurt.  Fruit and juice.  Desserts, such as cakes, cookies, ice cream, and candy. How does alcohol affect me? Alcohol can cause a sudden decrease in blood glucose (hypoglycemia),  especially if you use insulin or take certain oral diabetes medicines. Hypoglycemia can be a life-threatening condition. Symptoms of hypoglycemia (sleepiness, dizziness, and confusion) are similar to symptoms of having too much alcohol. If your health care provider says that alcohol is safe for you, follow these guidelines:  Limit alcohol intake to no more than 1 drink per day for nonpregnant women and 2 drinks per day for men. One drink equals 12 oz of beer, 5 oz of wine, or 1 oz of hard liquor.  Do not drink on an empty stomach.  Keep yourself hydrated with water, diet soda, or unsweetened iced tea.  Keep in mind that regular soda, juice, and other mixers may contain a lot of sugar and must be counted as carbs. What are tips for following this plan?  Reading food labels  Start by checking the serving size on the "Nutrition Facts" label of packaged foods and drinks. The amount of calories, carbs, fats, and other nutrients listed on the label is based on one serving of the item. Many items contain more than one serving per package.  Check the total grams (g) of carbs in one serving. You can calculate the number of servings of carbs in one serving by dividing the total carbs by 15. For example, if a food has 30 g of total carbs, it would be equal to 2 servings of carbs.  Check the number of grams (g) of saturated and trans fats in one serving. Choose foods that have low or no amount of these fats.  Check the  number of milligrams (mg) of salt (sodium) in one serving. Most people should limit total sodium intake to less than 2,300 mg per day.  Always check the nutrition information of foods labeled as "low-fat" or "nonfat". These foods may be higher in added sugar or refined carbs and should be avoided.  Talk to your dietitian to identify your daily goals for nutrients listed on the label. Shopping  Avoid buying canned, premade, or processed foods. These foods tend to be high in fat, sodium,  and added sugar.  Shop around the outside edge of the grocery store. This includes fresh fruits and vegetables, bulk grains, fresh meats, and fresh dairy. Cooking  Use low-heat cooking methods, such as baking, instead of high-heat cooking methods like deep frying.  Cook using healthy oils, such as olive, canola, or sunflower oil.  Avoid cooking with butter, cream, or high-fat meats. Meal planning  Eat meals and snacks regularly, preferably at the same times every day. Avoid going long periods of time without eating.  Eat foods high in fiber, such as fresh fruits, vegetables, beans, and whole grains. Talk to your dietitian about how many servings of carbs you can eat at each meal.  Eat 4-6 ounces (oz) of lean protein each day, such as lean meat, chicken, fish, eggs, or tofu. One oz of lean protein is equal to: ? 1 oz of meat, chicken, or fish. ? 1 egg. ?  cup of tofu.  Eat some foods each day that contain healthy fats, such as avocado, nuts, seeds, and fish. Lifestyle  Check your blood glucose regularly.  Exercise regularly as told by your health care provider. This may include: ? 150 minutes of moderate-intensity or vigorous-intensity exercise each week. This could be brisk walking, biking, or water aerobics. ? Stretching and doing strength exercises, such as yoga or weightlifting, at least 2 times a week.  Take medicines as told by your health care provider.  Do not use any products that contain nicotine or tobacco, such as cigarettes and e-cigarettes. If you need help quitting, ask your health care provider.  Work with a Veterinary surgeon or diabetes educator to identify strategies to manage stress and any emotional and social challenges. Questions to ask a health care provider  Do I need to meet with a diabetes educator?  Do I need to meet with a dietitian?  What number can I call if I have questions?  When are the best times to check my blood glucose? Where to find more  information:  American Diabetes Association: diabetes.org  Academy of Nutrition and Dietetics: www.eatright.[employer] of Diabetes and Digestive and Kidney Diseases (NIH): CarFlippers.tn Summary  A healthy meal plan will help you control your blood glucose and maintain a healthy lifestyle.  Working with a diet and nutrition specialist (dietitian) can help you make a meal plan that is best for you.  Keep in mind that carbohydrates (carbs) and alcohol have immediate effects on your blood glucose levels. It is important to count carbs and to use alcohol carefully. This information is not intended to replace advice given to you by your health care provider. Make sure you discuss any questions you have with your health care provider. Document Released: 12/16/2004 Document Revised: 10/19/2016 Document Reviewed: 04/25/2016 Elsevier Interactive Patient Education  2019 Elsevier Inc.   Angina Angina is a very bad discomfort or pain in the chest, neck, or arm. Angina may cause the following symptoms in your chest:  Crushing or squeezing pain. Pain might  last for more than a few minutes at a time. Or, it may stop and come back (recur) over a few minutes.  Tightness.  Fullness.  Pressure.  Heaviness. Some people also have:  Pain in the arms, neck, jaw, or back.  Heartburn or indigestion for no reason.  Shortness of breath.  An upset stomach (nausea).  Sudden cold sweats. Women and people with diabetes may have other less common symptoms such as:  Feeling tired (fatigue).  Feeling nervous or worried for no reason.  Feeling weak for no reason.  Dizziness or fainting. Follow these instructions at home: Medicines  Take over-the-counter and prescription medicines only as told by your doctor.  Do not take these medicines unless your doctor says that you can: ? NSAIDs. These include:  Ibuprofen.  Naproxen.  Celecoxib. ? Vitamin supplements that have  vitamin A, vitamin E, or both. ? Hormone therapy that contains estrogen with or without progestin. Eating and drinking   Eat a heart-healthy diet that includes: ? Plenty of fresh fruits and vegetables. ? Whole grains. ? Lowfat (lean) protein. ? Lowfat dairy products.  Work with a diet and nutrition specialist (dietitian) as told by your doctor. This person can help you make healthy food choices.  Follow other instructions from your doctor about eating or drinking restrictions. Activity  Follow an exercise program that your doctor tells you.  Return to your normal activities as told by your doctor. Ask your doctor what activities are safe for you.  When you feel tired, take a break. Plan breaks if you know you are going to feel tired. Lifestyle   Do not use any products that contain nicotine or tobacco. This includes cigarettes and e-cigarettes. If you need help quitting, ask your doctor.  If your doctor says you can drink alcohol, limit your drinking to: ? 0-1 drink a day for women. ? 0-2 drinks a day for men.  Be aware of how much alcohol is in your drink. In the [place] 1 drink equals to:  12 oz of beer.  5 oz of wine.  1 oz of hard liquor. General instructions  Stay at a healthy weight. If your doctor tells you to do so, work with him or her to lose weight.  Learn to deal with stress. If you need help, ask your doctor.  Take a depression screening test to see if you are at risk for depression. If you feel depressed, talk with your doctor.  Work with your doctor to manage any other health conditions that you have. These may include diabetes or high blood pressure.  Keep your vaccines up to date. Get a flu shot every year.  Keep all follow-up visits as told by your doctor. This is important. Get help right away if:  You have pain in your chest, neck, arm, jaw, stomach, or back, and the pain: ? Lasts more than a few minutes. ? Comes back. ? Is very  painful. ? Comes more often. ? Does not get better after you take medicine under your tongue (sublingual nitroglycerin).  You have any of these problems for no reason: ? Heartburn, or indigestion. ? Sweating a lot. ? Shortness of breath. ? Trouble breathing. ? Feeling sick to your stomach. ? Throwing up. ? Feeling more tired than usual. ? Feeling nervous or worrying more than usual. ? Weakness. ? Watery poop (diarrhea).  You are suddenly dizzy or light-headed.  You pass out (faint). These symptoms may be an emergency. Do not wait to  see if the symptoms will go away. Get medical help right away. Call your local emergency services (911 in the [place] Do not drive yourself to the hospital. Summary  Angina is very bad discomfort or pain in the chest, neck, or arm.  Angina may feel like a crushing or squeezing pain in the chest. It may feel like tightness, pressure, fullness, or heaviness in the chest.  Women or people with diabetes may have different symptoms from men, such as feeling nervous, being worried, being weak for no reason, or feeling tired.  Take medicines only as told by your doctor.  You should eat a heart-healthy diet and follow an exercise program. This information is not intended to replace advice given to you by your health care provider. Make sure you discuss any questions you have with your health care provider. Document Released: 09/07/2007 Document Revised: 05/05/2017 Document Reviewed: 05/05/2017 Elsevier Interactive Patient Education  2019 [employer].

## 2018-05-12 LAB — HEMOGLOBIN A1C
Est. average glucose Bld gHb Est-mCnc: 209 mg/dL
HEMOGLOBIN A1C: 8.9 % — AB (ref 4.8–5.6)

## 2018-05-12 LAB — MICROALBUMIN / CREATININE URINE RATIO
Creatinine, Urine: 61.3 mg/dL
Microalb/Creat Ratio: 1875 mg/g creat — ABNORMAL HIGH (ref 0–29)
Microalbumin, Urine: 1149.6 ug/mL

## 2018-05-14 ENCOUNTER — Telehealth: Payer: Self-pay | Admitting: Family Medicine

## 2018-05-14 MED ORDER — LOSARTAN POTASSIUM 50 MG PO TABS: 100.0000 mg | ORAL_TABLET | Freq: Every day | ORAL | 1 refills | Status: DC

## 2018-05-14 NOTE — Telephone Encounter (Signed)
Left voice mail to call back 

## 2018-05-14 NOTE — Telephone Encounter (Signed)
Notify chest x-ray unremarkable and doesn't indicate a reason for patient's on-going shortness of breath. I suspect the shortness of breath is combination of tobacco use and possible recent episodes of chest pain. I can send a prescription for albuterol to [facility] as I advised previously which I feel would improved symptoms of shortness of breath.

## 2018-05-14 NOTE — Telephone Encounter (Addendum)
Patient was also to be scheduled for 4-week follow-up and I do not see that a was made.  Schedule follow-up when you follow-up with him regarding medication and chest x-ray results. Notify patient that I increased his Losartan to 100 mg (2- 50 mg tablets) but omitted change from AVS.  Please remind patient to take this increased dose and take prior to follow-up with cardiology and prior to next office visit.

## 2018-05-14 NOTE — Telephone Encounter (Signed)
Provider would like me to notify patient of the increase in Losartan. Would like him to take two 50 mg tablets daily.

## 2018-05-15 NOTE — Telephone Encounter (Signed)
Patient made follow up appointment for 06/11/2018 @ 2:50 pm. Aware to increase Lorsartan 100 mg.

## 2018-05-15 NOTE — Progress Notes (Signed)
Patient notified of results & recommendations. Expressed understanding.

## 2018-05-15 NOTE — Telephone Encounter (Signed)
Patient notified of chest xray results & recommendations. Expressed understanding. Is agreeable to starting Albuterol inhaler.

## 2018-05-16 MED ORDER — ALBUTEROL SULFATE HFA 108 (90 BASE) MCG/ACT IN AERS: 2.0000 | INHALATION_SPRAY | RESPIRATORY_TRACT | 1 refills | Status: DC | PRN

## 2018-05-16 NOTE — Addendum Note (Signed)
Addended by: Bing NeighborsHARRIS, Torah Pinnock S on: 05/16/2018 08:07 AM   Modules accepted: Orders

## 2018-05-16 NOTE — Telephone Encounter (Signed)
Albuterol inhaler sent to [facility]

## 2018-05-17 ENCOUNTER — Telehealth: Payer: Self-pay | Admitting: Family Medicine

## 2018-05-17 NOTE — Telephone Encounter (Signed)
Dylan Anderson,  I submitted a referral to cardiology for this patient tomorrow will be 1 week ago. Could you follow-up? From the referral request, it doesn't appear that the patient has been contacted.   Thanks in advance,  Joaquin Courts, FNP

## 2018-05-21 NOTE — Telephone Encounter (Signed)
Good Morning  I called [facility] in [facility] are behind referrals they are going to call the patient today to schedule an appointment she didn't wanted to give me the appointment .

## 2018-05-26 DIAGNOSIS — R079 Chest pain, unspecified: Secondary | ICD-10-CM | POA: Insufficient documentation

## 2018-05-26 HISTORY — DX: Chest pain, unspecified: R07.9

## 2018-05-26 NOTE — Progress Notes (Addendum)
Cardiology Office Note:    Date:  05/28/2018   ID:  Dylan Anderson, DOB 04/17/1960, MRN [id number]  PCP:  Harris, Kimberly S, FNP  Cardiologist:  Brian Munley, MD    Referring MD: Harris, Kimberly S, FNP    ASSESSMENT:    1. Essential hypertension   2. Chest pain in adult   3. Pure hypercholesterolemia   4. Peripheral artery disease (HCC)   5. Diabetes mellitus type II, non insulin dependent (HCC)   6. PVC's (premature ventricular contractions)    PLAN:     I received communication regarding his cardiac CTA very high risk study with left main proximal LAD disease in retrospect I think his symptoms recently are unstable with an emergency room visit my office will contact him tomorrow and if agreeable we will set him up to undergo coronary angiography on Friday or Monday at the latest.  I contacted Master by phone reviewed the results of his cardiac CTA as well as the recommendation undergo coronary angiography risks benefits and options.  He voiced understanding and agreement with the need with his high risk symptoms and high risk cardiac CTA and we will schedule coronary angiography with the intent for revascularization likely surgical.   In order of problems listed above:  1. Chest pain is best described as atypical angina high probability of CAD with his peripheral arterial disease he has a poor candidate for treadmill based ischemia evaluation after discussion of benefits and options of pharmacologic perfusion study versus cardiac CTA he elects for be scheduled for cardiac CTA and reassess in the office in 6 weeks.  If high risk markers would benefit from revascularization.  Continue his current medical regimen which includes aspirin and statin. 2. Stable hypertension continue treatment including ARB 3. Continue a statin LDL remains above target if he has CAD he will require second agent PCSK9 4. Stable managed by vascular surgery 5. Stable managed by his PCP A1c remains above  target   Next appointment: 6 weeks after cardiac CTA   Medication Adjustments/Labs and Tests Ordered: Current medicines are reviewed at length with the patient today.  Concerns regarding medicines are outlined above.  Orders Placed This Encounter  Procedures  . CT CORONARY MORPH W/CTA COR W/SCORE W/CA W/CM &/OR WO/CM  . CT CORONARY FRACTIONAL FLOW RESERVE DATA PREP  . CT CORONARY FRACTIONAL FLOW RESERVE FLUID ANALYSIS  . EKG 12-Lead   No orders of the defined types were placed in this encounter.   Chief Complaint  Patient presents with  . Chest Pain    History of Present Illness:    Dylan Anderson is a 57 y.o. male with a hx of PAD left common iliac stent placed at [name] several years ago referred for chest pain referred by Dr Harris . Compliance with diet, lifestyle and medications: Yes  Seen by me today because of chest pain and shortness of breath.  He says once or twice a month without activity develops discomfort substernal radiates to both sides of the chest and resolves with rest in a few minutes the symptoms have not progressed.  He also finds himself episodically short of breath at rest but no cough or wheezing and is not having exertional shortness of breath.  He had one episode of the symptoms that caused him to think about going to the emergency room.  Is very limited in his abilities and can walk perhaps 1 block before stopping because of hip and lower extremity pain with his peripheral arterial   disease.  He has known history of PVCs and had a stress test that was unremarkable in 2013.  He gets lightheaded when he looks up and holds his arms above his head cerebrovascular duplex showed no evidence of significant extracranial carotid or vertebral stenosis.  No palpitation or TIA.  His chest pain is described as mild to moderate aching substernal radiating to both sides of the chest no nausea or vomiting or diaphoresis.  He has shortness of breath associated with it.  He has  a background history of frequent PVCs but is not having palpitation glucose and there is CMP. Past Medical History:  Diagnosis Date  . Acute gout involving toe of left foot 09/21/2017  . Arthritis   . Back pain   . Chest pain in adult 05/26/2018  . Chronic cough 07/13/2017  . Claudication (HCC)   . Diabetes mellitus type II, non insulin dependent (HCC) 07/10/2012  . Erectile dysfunction   . Gastroesophageal reflux disease 04/27/2017  . Hand pain 03/18/2017  . Healthcare maintenance 07/12/2017  . Hip pain 05/20/2017  . Hyperlipidemia 01/25/2017  . Hypertension 07/10/2012  . Insomnia 02/06/2017  . Low back pain 01/25/2017  . Peripheral artery disease (HCC) 06/07/2017  . PVD (peripheral vascular disease) (HCC)   . Tobacco abuse 07/10/2012  . Type 2 diabetes mellitus (HCC)     Past Surgical History:  Procedure Laterality Date  . ILIAC ARTERY STENT    . stent Left    left common and left internal illiac stent    Current Medications: Current Meds  Medication Sig  . atorvastatin (LIPITOR) 40 MG tablet Take 2 tablets (80 mg total) by mouth daily at 6 PM.  . gabapentin (NEURONTIN) 300 MG capsule Take 1 capsule (300 mg total) by mouth 3 (three) times daily.  . glipiZIDE (GLUCOTROL) 5 MG tablet Take 1 tablet (5 mg total) by mouth 2 (two) times daily before a meal.  . losartan (COZAAR) 50 MG tablet Take 2 tablets (100 mg total) by mouth daily.  . metFORMIN (GLUCOPHAGE) 500 MG tablet Take 2 tablets (1,000 mg total) by mouth 2 (two) times daily with a meal.  . pantoprazole (PROTONIX) 20 MG tablet Take 1 tablet (20 mg total) by mouth daily.  . tamsulosin (FLOMAX) 0.4 MG CAPS capsule Take 1 capsule (0.4 mg total) by mouth daily.  . [DISCONTINUED] aspirin 325 MG tablet Take 325 mg by mouth daily.     Allergies:   Patient has no known allergies.   Social History   Socioeconomic History  . Marital status: Married    Spouse name: Not on file  . Number of children: Not on file  . Years of education:  Not on file  . Highest education level: Not on file  Occupational History  . Occupation: unemployed  Social Needs  . Financial resource strain: Not on file  . Food insecurity:    Worry: Not on file    Inability: Not on file  . Transportation needs:    Medical: Not on file    Non-medical: Not on file  Tobacco Use  . Smoking status: Current Every Day Smoker    Packs/day: 0.50    Years: 17.00    Pack years: 8.50    Types: Cigarettes    Start date: 04/04/1978  . Smokeless tobacco: Never Used  . Tobacco comment: Max 1 ppd  Substance and Sexual Activity  . Alcohol use: Yes    Comment: 2-3 mixed drinks daily  . Drug use: No  .   Sexual activity: Yes    Partners: Female  Lifestyle  . Physical activity:    Days per week: Not on file    Minutes per session: Not on file  . Stress: Not on file  Relationships  . Social connections:    Talks on phone: Not on file    Gets together: Not on file    Attends religious service: Not on file    Active member of club or organization: Not on file    Attends meetings of clubs or organizations: Not on file    Relationship status: Not on file  Other Topics Concern  . Not on file  Social History Narrative  . Not on file     Family History: The patient's family history includes COPD in his sister; Heart attack in his maternal aunt and maternal uncle; Heart failure in his mother; Osteoarthritis in his mother; Stroke in his sister. ROS:  Review of Systems  Constitution: Positive for malaise/fatigue.  HENT: Negative.   Eyes: Positive for visual halos.  Cardiovascular: Positive for chest pain and dyspnea on exertion.  Respiratory: Positive for shortness of breath.   Endocrine: Positive for cold intolerance.  Hematologic/Lymphatic: Negative.   Skin: Negative.   Musculoskeletal: Positive for back pain, joint pain and muscle weakness (left  hand ).  Gastrointestinal: Negative.   Genitourinary: Negative.   Neurological: Positive for loss of  balance, paresthesias and weakness.  Psychiatric/Behavioral: Negative.   Allergic/Immunologic: Negative.    Please see the history of present illness.    All other systems reviewed and are negative.  EKGs/Labs/Other Studies Reviewed:    The following studies were reviewed today:  EKG:  EKG ordered today.  The ekg ordered today demonstrates sinus rhythm incomplete right bundle branch block 1 PVC  Holter 12/07/11;   FREQUENT ISOLATED PREMATURE VENTRICULAR COMPLEXES AND PVCS IN A PATTERN OF BIGEMINY   Stress echo 12/23/11:   SUMMARY The patient had no chest pain. The patient achieved 83 % of maximum predicted heart rate. The METS achieved was 10. Exercise capacity was average. The baseline ECG displays normal sinus rhythm. Negative stress ECG for inducible ischemia at heart rate achieved. Normal left ventricular function at rest. The estimated LV ejection fraction is 55-60% . There were no segmental wall motion abnormalities post exercise. Negative exercise echocardiography for inducible ischemia at heart rate achieved  Recent Labs: 02/08/2018: ALT 13; BUN 20; Creatinine, Ser 1.01; Potassium 5.1; Sodium 137  Recent Lipid Panel    Component Value Date/Time   CHOL 232 (H) 02/08/2018 0932   TRIG 155 (H) 02/08/2018 0932   HDL 45 02/08/2018 0932   CHOLHDL 5.2 (H) 02/08/2018 0932   CHOLHDL 5.7 07/10/2012 1301   VLDL 36 07/10/2012 1301   LDLCALC 156 (H) 02/08/2018 0932    Physical Exam:    VS:  BP 104/64 (BP Location: Left Arm, Patient Position: Sitting, Cuff Size: Normal)   Pulse 83   Ht 5' 8" (1.727 m)   Wt 152 lb (68.9 kg)   SpO2 94%   BMI 23.11 kg/m     Wt Readings from Last 3 Encounters:  05/28/18 152 lb (68.9 kg)  05/11/18 159 lb 6.4 oz (72.3 kg)  03/06/18 147 lb (66.7 kg)     GEN:  Well nourished, well developed in no acute distress HEENT: Normal NECK: No JVD; No carotid bruits LYMPHATICS: No lymphadenopathy CARDIAC: No murmur RRR, no murmurs, rubs,  gallops RESPIRATORY:  Clear to auscultation without rales, wheezing or rhonchi  ABDOMEN:   Soft, non-tender, non-distended MUSCULOSKELETAL:  No edema; No deformity  SKIN: Warm and dry NEUROLOGIC:  Alert and oriented x 3 PSYCHIATRIC:  Normal affect    Signed, Brian Munley, MD  05/28/2018 12:01 PM    [facility]  

## 2018-05-26 NOTE — H&P (View-Only) (Signed)
Cardiology Office Note:    Date:  05/28/2018   ID:  Dylan Anderson, DOB 1960-07-30, MRN [id number]  PCP:  Dylan Neighbors, FNP  Cardiologist:  Dylan Herrlich, MD    Referring MD: Dylan Neighbors, FNP    ASSESSMENT:    1. Essential hypertension   2. Chest pain in adult   3. Pure hypercholesterolemia   4. Peripheral artery disease (HCC)   5. Diabetes mellitus type II, non insulin dependent (HCC)   6. PVC's (premature ventricular contractions)    PLAN:     I received communication regarding his cardiac CTA very high risk study with left main proximal LAD disease in retrospect I think his symptoms recently are unstable with an emergency room visit my office will contact him tomorrow and if agreeable we will set him up to undergo coronary angiography on Friday or Monday at the latest.  I contacted Dylan Anderson by phone reviewed the results of his cardiac CTA as well as the recommendation undergo coronary angiography risks benefits and options.  He voiced understanding and agreement with the need with his high risk symptoms and high risk cardiac CTA and we will schedule coronary angiography with the intent for revascularization likely surgical.   In order of problems listed above:  1. Chest pain is best described as atypical angina high probability of CAD with his peripheral arterial disease he has a poor candidate for treadmill based ischemia evaluation after discussion of benefits and options of pharmacologic perfusion study versus cardiac CTA he elects for be scheduled for cardiac CTA and reassess in the office in 6 weeks.  If high risk markers would benefit from revascularization.  Continue his current medical regimen which includes aspirin and statin. 2. Stable hypertension continue treatment including ARB 3. Continue a statin LDL remains above target if he has CAD he will require second agent PCSK9 4. Stable managed by vascular surgery 5. Stable managed by his PCP A1c remains above  target   Next appointment: 6 weeks after cardiac CTA   Medication Adjustments/Labs and Tests Ordered: Current medicines are reviewed at length with the patient today.  Concerns regarding medicines are outlined above.  Orders Placed This Encounter  Procedures  . CT CORONARY MORPH W/CTA COR W/SCORE W/CA W/CM &/OR WO/CM  . CT CORONARY FRACTIONAL FLOW RESERVE DATA PREP  . CT CORONARY FRACTIONAL FLOW RESERVE FLUID ANALYSIS  . EKG 12-Lead   No orders of the defined types were placed in this encounter.   Chief Complaint  Patient presents with  . Chest Pain    History of Present Illness:    Dylan Anderson is a 58 y.o. male with a hx of PAD left common iliac stent placed at [facility] several years ago referred for chest pain referred by Dr Tiburcio Pea . Compliance with diet, lifestyle and medications: Yes  Seen by me today because of chest pain and shortness of breath.  He says once or twice a month without activity develops discomfort substernal radiates to both sides of the chest and resolves with rest in a few minutes the symptoms have not progressed.  He also finds himself episodically short of breath at rest but no cough or wheezing and is not having exertional shortness of breath.  He had one episode of the symptoms that caused him to think about going to the emergency room.  Is very limited in his abilities and can walk perhaps 1 block before stopping because of hip and lower extremity pain with his peripheral arterial  disease.  He has known history of PVCs and had a stress test that was unremarkable in 2013.  He gets lightheaded when he looks up and holds his arms above his head cerebrovascular duplex showed no evidence of significant extracranial carotid or vertebral stenosis.  No palpitation or TIA.  His chest pain is described as mild to moderate aching substernal radiating to both sides of the chest no nausea or vomiting or diaphoresis.  He has shortness of breath associated with it.  He has  a background history of frequent PVCs but is not having palpitation glucose and there is CMP. Past Medical History:  Diagnosis Date  . Acute gout involving toe of left foot 09/21/2017  . Arthritis   . Back pain   . Chest pain in adult 05/26/2018  . Chronic cough 07/13/2017  . Claudication (HCC)   . Diabetes mellitus type II, non insulin dependent (HCC) 07/10/2012  . Erectile dysfunction   . Gastroesophageal reflux disease 04/27/2017  . Hand pain 03/18/2017  . Healthcare maintenance 07/12/2017  . Hip pain 05/20/2017  . Hyperlipidemia 01/25/2017  . Hypertension 07/10/2012  . Insomnia 02/06/2017  . Low back pain 01/25/2017  . Peripheral artery disease (HCC) 06/07/2017  . PVD (peripheral vascular disease) (HCC)   . Tobacco abuse 07/10/2012  . Type 2 diabetes mellitus (HCC)     Past Surgical History:  Procedure Laterality Date  . ILIAC ARTERY STENT    . stent Left    left common and left internal illiac stent    Current Medications: Current Meds  Medication Sig  . atorvastatin (LIPITOR) 40 MG tablet Take 2 tablets (80 mg total) by mouth daily at 6 PM.  . gabapentin (NEURONTIN) 300 MG capsule Take 1 capsule (300 mg total) by mouth 3 (three) times daily.  [name] glipiZIDE (GLUCOTROL) 5 MG tablet Take 1 tablet (5 mg total) by mouth 2 (two) times daily before a meal.  . losartan (COZAAR) 50 MG tablet Take 2 tablets (100 mg total) by mouth daily.  . metFORMIN (GLUCOPHAGE) 500 MG tablet Take 2 tablets (1,000 mg total) by mouth 2 (two) times daily with a meal.  . pantoprazole (PROTONIX) 20 MG tablet Take 1 tablet (20 mg total) by mouth daily.  . tamsulosin (FLOMAX) 0.4 MG CAPS capsule Take 1 capsule (0.4 mg total) by mouth daily.  . [DISCONTINUED] aspirin 325 MG tablet Take 325 mg by mouth daily.     Allergies:   Patient has no known allergies.   Social History   Socioeconomic History  . Marital status: Married    Spouse name: Not on file  . Number of children: Not on file  . Years of education:  Not on file  . Highest education level: Not on file  Occupational History  . Occupation: unemployed  Social Needs  . Financial resource strain: Not on file  . Food insecurity:    Worry: Not on file    Inability: Not on file  . Transportation needs:    Medical: Not on file    Non-medical: Not on file  Tobacco Use  . Smoking status: Current Every Day Smoker    Packs/day: 0.50    Years: 17.00    Pack years: 8.50    Types: Cigarettes    Start date: 04/04/1978  . Smokeless tobacco: Never Used  . Tobacco comment: Max 1 ppd  Substance and Sexual Activity  . Alcohol use: Yes    Comment: 2-3 mixed drinks daily  . Drug use: No  .  Sexual activity: Yes    Partners: Female  Lifestyle  . Physical activity:    Days per week: Not on file    Minutes per session: Not on file  . Stress: Not on file  Relationships  . Social connections:    Talks on phone: Not on file    Gets together: Not on file    Attends religious service: Not on file    Active member of club or organization: Not on file    Attends meetings of clubs or organizations: Not on file    Relationship status: Not on file  Other Topics Concern  . Not on file  Social History Narrative  . Not on file     Family History: The patient's family history includes COPD in his sister; Heart attack in his maternal aunt and maternal uncle; Heart failure in his mother; Osteoarthritis in his mother; Stroke in his sister. ROS:  Review of Systems  Constitution: Positive for malaise/fatigue.  HENT: Negative.   Eyes: Positive for visual halos.  Cardiovascular: Positive for chest pain and dyspnea on exertion.  Respiratory: Positive for shortness of breath.   Endocrine: Positive for cold intolerance.  Hematologic/Lymphatic: Negative.   Skin: Negative.   Musculoskeletal: Positive for back pain, joint pain and muscle weakness (left  hand ).  Gastrointestinal: Negative.   Genitourinary: Negative.   Neurological: Positive for loss of  balance, paresthesias and weakness.  Psychiatric/Behavioral: Negative.   Allergic/Immunologic: Negative.    Please see the history of present illness.    All other systems reviewed and are negative.  EKGs/Labs/Other Studies Reviewed:    The following studies were reviewed today:  EKG:  EKG ordered today.  The ekg ordered today demonstrates sinus rhythm incomplete right bundle branch block 1 PVC  Holter 12/07/11;   FREQUENT ISOLATED PREMATURE VENTRICULAR COMPLEXES AND PVCS IN A PATTERN OF BIGEMINY   Stress echo 12/23/11:   SUMMARY The patient had no chest pain. The patient achieved 83 % of maximum predicted heart rate. The METS achieved was 10. Exercise capacity was average. The baseline ECG displays normal sinus rhythm. Negative stress ECG for inducible ischemia at heart rate achieved. Normal left ventricular function at rest. The estimated LV ejection fraction is 55-60% . There were no segmental wall motion abnormalities post exercise. Negative exercise echocardiography for inducible ischemia at heart rate achieved  Recent Labs: 02/08/2018: ALT 13; BUN 20; Creatinine, Ser 1.01; Potassium 5.1; Sodium 137  Recent Lipid Panel    Component Value Date/Time   CHOL 232 (H) 02/08/2018 0932   TRIG 155 (H) 02/08/2018 0932   HDL 45 02/08/2018 0932   CHOLHDL 5.2 (H) 02/08/2018 0932   CHOLHDL 5.7 07/10/2012 1301   VLDL 36 07/10/2012 1301   LDLCALC 156 (H) 02/08/2018 0932    Physical Exam:    VS:  BP 104/64 (BP Location: Left Arm, Patient Position: Sitting, Cuff Size: Normal)   Pulse 83   Ht  (1.727 m)   Wt 152 lb (68.9 kg)   SpO2 94%   BMI 23.11 kg/m     Wt Readings from Last 3 Encounters:  05/28/18 152 lb (68.9 kg)  05/11/18 159 lb 6.4 oz (72.3 kg)  03/06/18 147 lb (66.7 kg)     GEN:  Well nourished, well developed in no acute distress HEENT: Normal NECK: No JVD; No carotid bruits LYMPHATICS: No lymphadenopathy CARDIAC: No murmur RRR, no murmurs, rubs,  gallops RESPIRATORY:  Clear to auscultation without rales, wheezing or rhonchi  ABDOMEN:  Soft, non-tender, non-distended MUSCULOSKELETAL:  No edema; No deformity  SKIN: Warm and dry NEUROLOGIC:  Alert and oriented x 3 PSYCHIATRIC:  Normal affect    Signed, Dylan Herrlich, MD  05/28/2018 12:01 PM    [facility]

## 2018-05-28 ENCOUNTER — Ambulatory Visit (INDEPENDENT_AMBULATORY_CARE_PROVIDER_SITE_OTHER): Payer: Self-pay | Admitting: Cardiology

## 2018-05-28 VITALS — BP 104/64 | HR 83 | Ht 68.0 in | Wt 152.0 lb

## 2018-05-28 DIAGNOSIS — E119 Type 2 diabetes mellitus without complications: Secondary | ICD-10-CM

## 2018-05-28 DIAGNOSIS — E7800 Pure hypercholesterolemia, unspecified: Secondary | ICD-10-CM

## 2018-05-28 DIAGNOSIS — R079 Chest pain, unspecified: Secondary | ICD-10-CM

## 2018-05-28 DIAGNOSIS — I739 Peripheral vascular disease, unspecified: Secondary | ICD-10-CM

## 2018-05-28 DIAGNOSIS — I493 Ventricular premature depolarization: Secondary | ICD-10-CM | POA: Insufficient documentation

## 2018-05-28 DIAGNOSIS — I10 Essential (primary) hypertension: Secondary | ICD-10-CM

## 2018-05-28 MED ORDER — METOPROLOL TARTRATE 25 MG PO TABS: ORAL_TABLET | ORAL | 0 refills | Status: DC

## 2018-05-28 MED ORDER — ASPIRIN EC 81 MG PO TBEC: 81.0000 mg | DELAYED_RELEASE_TABLET | Freq: Every day | ORAL | 3 refills | Status: DC

## 2018-05-28 MED ORDER — METOPROLOL TARTRATE 50 MG PO TABS: ORAL_TABLET | ORAL | 0 refills | Status: DC

## 2018-05-28 NOTE — Patient Instructions (Addendum)
Medication Instructions:  Your physician has recommended you make the following change in your medication:   STOP taking aspirin 325 mg START taking aspirin 81 mg (1 tab) daily   If you need a refill on your cardiac medications before your next appointment, please call your pharmacy.   Lab work: NONE today but you will need to walk into our [place] or [place] office 3-7 days prior to your procedure to have a BMP drawn. No appointment needed anytime with exception of 12-1  If you have labs (blood work) drawn today and your tests are completely normal, you will receive your results only by: [name] MyChart Message (if you have MyChart) OR . A paper copy in the mail If you have any lab test that is abnormal or we need to change your treatment, we will call you to review the results.  Testing/Procedures: An EKG was performed today.  Your physician has requested that you have cardiac CT. Cardiac computed tomography (CT) is a painless test that uses an x-ray machine to take clear, detailed pictures of your heart. For further information please visit [email or web address] Please follow instruction sheet as given.  Please arrive at the [facility] main entrance of [facility] at xx:xx AM (30-45 minutes prior to test start time)  [facility] [address] [place], [place] [address] [phone]  Proceed to the [facility] Radiology Department (First Floor).  Please follow these instructions carefully (unless otherwise directed):    On the Night Before the Test: . Be sure to Drink plenty of water. . Do not consume any caffeinated/decaffeinated beverages or chocolate 12 hours prior to your test. . Do not take any antihistamines 12 hours prior to your test. . If you take Metformin do not take 24 hours prior to test. DO NOT TAKE glipizide the morning of procedure.    On the Day of the Test: . Drink plenty of water. Do not drink any water within one hour of the test. . Do not  eat any food 4 hours prior to the test. . You may take your regular medications prior to the test.  . Take metoprolol (Lopressor) two hours prior to test.                   -If HR is less than 55 BPM- No Beta Blocker                -IF HR is greater than 55 BPM and patient is less than or equal to 82 yrs old Lopressor 100mg  x1.                      After the Test: . Drink plenty of water. . After receiving IV contrast, you may experience a mild flushed feeling. This is normal. . On occasion, you may experience a mild rash up to 24 hours after the test. This is not dangerous. If this occurs, you can take Benadryl 25 mg and increase your fluid intake. . If you experience trouble breathing, this can be serious. If it is severe call 911 IMMEDIATELY. If it is mild, please call our office. . If you take any of these medications: Glipizide/Metformin, Avandament, Glucavance, please do not take 48 hours after completing test.     Follow-Up: At [facility], you and your health needs are our priority.  As part of our continuing mission to provide you with exceptional heart care, we have created designated Provider Care Teams.  These Care Teams include your primary Cardiologist (physician) and Advanced Practice Providers (APPs -  Physician Assistants and Nurse Practitioners) who all work together to provide you with the care you need, when you need it. You will need a follow up appointment in 6 weeks.    Any Other Special Instructions Will Be Listed Below    Cardiac CT Angiogram  A cardiac CT angiogram is a procedure to look at the heart and the area around the heart. It may be done to help find the cause of chest pains or other symptoms of heart disease. During this procedure, a large X-ray machine, called a CT scanner, takes detailed pictures of the heart and the surrounding area after a dye (contrast material) has been injected into blood vessels in the area. The procedure is also sometimes  called a coronary CT angiogram, coronary artery scanning, or CTA. A cardiac CT angiogram allows the health care provider to see how well blood is flowing to and from the heart. The health care provider will be able to see if there are any problems, such as:  Blockage or narrowing of the coronary arteries in the heart.  Fluid around the heart.  Signs of weakness or disease in the muscles, valves, and tissues of the heart. Tell a health care provider about:  Any allergies you have. This is especially important if you have had a previous allergic reaction to contrast dye.  All medicines you are taking, including vitamins, herbs, eye drops, creams, and over-the-counter medicines.  Any blood disorders you have.  Any surgeries you have had.  Any medical conditions you have.  Whether you are pregnant or may be pregnant.  Any anxiety disorders, chronic pain, or other conditions you have that may increase your stress or prevent you from lying still. What are the risks? Generally, this is a safe procedure. However, problems may occur, including:  Bleeding.  Infection.  Allergic reactions to medicines or dyes.  Damage to other structures or organs.  Kidney damage from the dye or contrast that is used.  Increased risk of cancer from radiation exposure. This risk is low. Talk with your health care provider about: ? The risks and benefits of testing. ? How you can receive the lowest dose of radiation. What happens before the procedure?  Wear comfortable clothing and remove any jewelry, glasses, dentures, and hearing aids.  Follow instructions from your health care provider about eating and drinking. This may include: ? For 12 hours before the test - avoid caffeine. This includes tea, coffee, soda, energy drinks, and diet pills. Drink plenty of water or other fluids that do not have caffeine in them. Being well-hydrated can prevent complications. ? For 4-6 hours before the test - stop  eating and drinking. The contrast dye can cause nausea, but this is less likely if your stomach is empty.  Ask your health care provider about changing or stopping your regular medicines. This is especially important if you are taking diabetes medicines, blood thinners, or medicines to treat erectile dysfunction. What happens during the procedure?  Hair on your chest may need to be removed so that small sticky patches called electrodes can be placed on your chest. These will transmit information that helps to monitor your heart during the test.  An IV tube will be inserted into one of your veins.  You might be given a medicine to control your heart rate during the test. This will help to ensure that good images are obtained.  You will be asked to lie on an exam table. This table will slide in and out of the CT machine during the procedure.  Contrast dye will be injected into the IV tube. You might feel warm, or you may get a metallic taste in your mouth.  You will be given a medicine (nitroglycerin) to relax (dilate) the arteries in your heart.  The table that you are lying on will move into the CT machine tunnel for the scan.  The person running the machine will give you instructions while the scans are being done. You may be asked to: ? Keep your arms above your head. ? Hold your breath. ? Stay very still, even if the table is moving.  When the scanning is complete, you will be moved out of the machine.  The IV tube will be removed. The procedure may vary among health care providers and hospitals. What happens after the procedure?  You might feel warm, or you may get a metallic taste in your mouth from the contrast dye.  You may have a headache from the nitroglycerin.  After the procedure, drink water or other fluids to wash (flush) the contrast material out of your body.  Contact a health care provider if you have any symptoms of allergy to the contrast. These symptoms  include: ? Shortness of breath. ? Rash or hives. ? A racing heartbeat.  Most people can return to their normal activities right after the procedure. Ask your health care provider what activities are safe for you.  It is up to you to get the results of your procedure. Ask your health care provider, or the department that is doing the procedure, when your results will be ready. Summary  A cardiac CT angiogram is a procedure to look at the heart and the area around the heart. It may be done to help find the cause of chest pains or other symptoms of heart disease.  During this procedure, a large X-ray machine, called a CT scanner, takes detailed pictures of the heart and the surrounding area after a dye (contrast material) has been injected into blood vessels in the area.  Ask your health care provider about changing or stopping your regular medicines before the procedure. This is especially important if you are taking diabetes medicines, blood thinners, or medicines to treat erectile dysfunction.  After the procedure, drink water or other fluids to wash (flush) the contrast material out of your body. This information is not intended to replace advice given to you by your health care provider. Make sure you discuss any questions you have with your health care provider. Document Released: 03/03/2008 Document Revised: 02/08/2016 Document Reviewed: 02/08/2016 Elsevier Interactive Patient Education  2019 [employer].

## 2018-06-01 ENCOUNTER — Ambulatory Visit: Payer: Self-pay

## 2018-06-03 ENCOUNTER — Other Ambulatory Visit: Payer: Self-pay

## 2018-06-03 ENCOUNTER — Encounter (HOSPITAL_COMMUNITY): Payer: Self-pay | Admitting: Emergency Medicine

## 2018-06-03 ENCOUNTER — Emergency Department (HOSPITAL_COMMUNITY)
Admission: EM | Admit: 2018-06-03 | Discharge: 2018-06-03 | Disposition: A | Discharge status: Home / Self Care | Payer: Self-pay | Attending: Emergency Medicine | Admitting: Emergency Medicine

## 2018-06-03 ENCOUNTER — Emergency Department (HOSPITAL_COMMUNITY): Payer: Self-pay

## 2018-06-03 DIAGNOSIS — E119 Type 2 diabetes mellitus without complications: Secondary | ICD-10-CM | POA: Insufficient documentation

## 2018-06-03 DIAGNOSIS — Z79899 Other long term (current) drug therapy: Secondary | ICD-10-CM | POA: Insufficient documentation

## 2018-06-03 DIAGNOSIS — F17210 Nicotine dependence, cigarettes, uncomplicated: Secondary | ICD-10-CM | POA: Insufficient documentation

## 2018-06-03 DIAGNOSIS — J9801 Acute bronchospasm: Secondary | ICD-10-CM | POA: Insufficient documentation

## 2018-06-03 DIAGNOSIS — R0602 Shortness of breath: Secondary | ICD-10-CM | POA: Insufficient documentation

## 2018-06-03 DIAGNOSIS — Z7984 Long term (current) use of oral hypoglycemic drugs: Secondary | ICD-10-CM | POA: Insufficient documentation

## 2018-06-03 DIAGNOSIS — D649 Anemia, unspecified: Secondary | ICD-10-CM | POA: Insufficient documentation

## 2018-06-03 DIAGNOSIS — Z7982 Long term (current) use of aspirin: Secondary | ICD-10-CM | POA: Insufficient documentation

## 2018-06-03 DIAGNOSIS — I10 Essential (primary) hypertension: Secondary | ICD-10-CM | POA: Insufficient documentation

## 2018-06-03 LAB — BASIC METABOLIC PANEL
Anion gap: 11 (ref 5–15)
BUN: 13 mg/dL (ref 6–20)
CO2: 20 mmol/L — AB (ref 22–32)
Calcium: 8.8 mg/dL — ABNORMAL LOW (ref 8.9–10.3)
Chloride: 107 mmol/L (ref 98–111)
Creatinine, Ser: 1 mg/dL (ref 0.61–1.24)
GFR calc Af Amer: >60 mL/min (ref >60)
GFR calc non Af Amer: >60 mL/min (ref >60)
Glucose, Bld: 203 mg/dL — ABNORMAL HIGH (ref 70–99)
POTASSIUM: 4.3 mmol/L (ref 3.5–5.1)
Sodium: 138 mmol/L (ref 135–145)

## 2018-06-03 LAB — CBC WITH DIFFERENTIAL/PLATELET
ABS IMMATURE GRANULOCYTES: 0.06 10*3/uL (ref 0.00–0.07)
Basophils Absolute: 0.1 10*3/uL (ref 0.0–0.1)
Basophils Relative: 1 %
Eosinophils Absolute: 0.3 10*3/uL (ref 0.0–0.5)
Eosinophils Relative: 3 %
HCT: 39.7 % (ref 39.0–52.0)
HEMOGLOBIN: 12.7 g/dL — AB (ref 13.0–17.0)
IMMATURE GRANULOCYTES: 1 %
Lymphocytes Relative: 35 %
Lymphs Abs: 2.9 10*3/uL (ref 0.7–4.0)
MCH: 28.2 pg (ref 26.0–34.0)
MCHC: 32 g/dL (ref 30.0–36.0)
MCV: 88 fL (ref 80.0–100.0)
Monocytes Absolute: 0.6 10*3/uL (ref 0.1–1.0)
Monocytes Relative: 7 %
Neutro Abs: 4.4 10*3/uL (ref 1.7–7.7)
Neutrophils Relative %: 53 %
Platelets: 290 10*3/uL (ref 150–400)
RBC: 4.51 MIL/uL (ref 4.22–5.81)
RDW: 12.4 % (ref 11.5–15.5)
WBC: 8.3 10*3/uL (ref 4.0–10.5)
nRBC: 0 % (ref 0.0–0.2)

## 2018-06-03 LAB — CBG MONITORING, ED: Glucose-Capillary: 208 mg/dL — ABNORMAL HIGH (ref 70–99)

## 2018-06-03 LAB — TROPONIN I: Troponin I: <0.03 ng/mL (ref <0.03)

## 2018-06-03 LAB — BRAIN NATRIURETIC PEPTIDE: B Natriuretic Peptide: 16.1 pg/mL (ref 0.0–100.0)

## 2018-06-03 MED ORDER — PREDNISONE 20 MG PO TABS
60.0000 mg | ORAL_TABLET | Freq: Once | ORAL | Status: AC
  Administered 2018-06-03: 60 mg via ORAL
  Filled 2018-06-03: qty 3

## 2018-06-03 MED ORDER — ALBUTEROL SULFATE HFA 108 (90 BASE) MCG/ACT IN AERS
2.0000 | INHALATION_SPRAY | RESPIRATORY_TRACT | Status: DC | PRN
  Filled 2018-06-03: qty 6.7

## 2018-06-03 MED ORDER — PREDNISONE 20 MG PO TABS: 40.0000 mg | ORAL_TABLET | Freq: Every day | ORAL | 0 refills | Status: DC

## 2018-06-03 NOTE — Discharge Instructions (Signed)
STOP SMOKING!!  Use the inhaler - two puffs at a time, every four hours, as needed for difficulty breathing.  Return if symptoms are not being controlled adequately at home.

## 2018-06-03 NOTE — ED Provider Notes (Signed)
[facility] EMERGENCY DEPARTMENT Provider Note   CSN: [id number] Arrival date & time: 06/03/18  0044    History   Chief Complaint Chief Complaint  Patient presents with  . Shortness of Breath    HPI Dylan Anderson is a 58 y.o. male.   The history is provided by the patient.  Shortness of Breath  He has history of diabetes, hypertension, hyperlipidemia, peripheral vascular disease and comes in because of difficulty breathing.  He states that he took a shower tonight and, after that, noted difficulty breathing.  He denies any cough and denies any chest pain, heaviness, tightness, pressure.  There was no treatment given at home.  EMS noted diminished breath sounds and gave him a nebulizer treatment which he states has helped him feel better.  He is a somewhat vague historian making obtaining adequate history difficult.  Past Medical History:  Diagnosis Date  . Acute gout involving toe of left foot 09/21/2017  . Arthritis   . Back pain   . Chest pain in adult 05/26/2018  . Chronic cough 07/13/2017  . Claudication (HCC)   . Diabetes mellitus type II, non insulin dependent (HCC) 07/10/2012  . Erectile dysfunction   . Gastroesophageal reflux disease 04/27/2017  . Hand pain 03/18/2017  . Healthcare maintenance 07/12/2017  . Hip pain 05/20/2017  . Hyperlipidemia 01/25/2017  . Hypertension 07/10/2012  . Insomnia 02/06/2017  . Low back pain 01/25/2017  . Peripheral artery disease (HCC) 06/07/2017  . PVD (peripheral vascular disease) (HCC)   . Tobacco abuse 07/10/2012  . Type 2 diabetes mellitus [facility])     Patient Active Problem List   Diagnosis Date Noted  . PVC's (premature ventricular contractions) 05/28/2018  . Chest pain in adult 05/26/2018  . Claudication (HCC) 11/05/2017  . Acute gout involving toe of left foot 09/21/2017  . Chronic cough 07/13/2017  . Healthcare maintenance 07/12/2017  . Peripheral artery disease (HCC) 06/07/2017  . Hip pain 05/20/2017  .  Gastroesophageal reflux disease 04/27/2017  . Hand pain 03/18/2017  . Insomnia 02/06/2017  . Hyperlipidemia 01/25/2017  . Erectile dysfunction 01/25/2017  . Low back pain 01/25/2017  . Diabetes mellitus type II, non insulin dependent (HCC) 07/10/2012  . Hypertension 07/10/2012  . Tobacco abuse 07/10/2012    Past Surgical History:  Procedure Laterality Date  . ILIAC ARTERY STENT    . stent Left    left common and left internal illiac stent        Home Medications    Prior to Admission medications   Medication Sig Start Date End Date Taking? Authorizing Provider  aspirin EC 81 MG tablet Take 1 tablet (81 mg total) by mouth daily. 05/28/18   Baldo Daub, MD  atorvastatin (LIPITOR) 40 MG tablet Take 2 tablets (80 mg total) by mouth daily at 6 PM. 02/13/18 05/28/20  Bing Neighbors, FNP  gabapentin (NEURONTIN) 300 MG capsule Take 1 capsule (300 mg total) by mouth 3 (three) times daily. 05/11/18   Bing Neighbors, FNP  glipiZIDE (GLUCOTROL) 5 MG tablet Take 1 tablet (5 mg total) by mouth 2 (two) times daily before a meal. 05/11/18   Bing Neighbors, FNP  losartan (COZAAR) 50 MG tablet Take 2 tablets (100 mg total) by mouth daily. 05/14/18   Bing Neighbors, FNP  metFORMIN (GLUCOPHAGE) 500 MG tablet Take 2 tablets (1,000 mg total) by mouth 2 (two) times daily with a meal. 08/03/17   Hensel, Santiago Bumpers, MD  metoprolol tartrate (LOPRESSOR)  50 MG tablet Take 2 tablets two hours prior to procedure. 05/28/18   Baldo Daub, MD  pantoprazole (PROTONIX) 20 MG tablet Take 1 tablet (20 mg total) by mouth daily. 05/11/18   Bing Neighbors, FNP  tamsulosin (FLOMAX) 0.4 MG CAPS capsule Take 1 capsule (0.4 mg total) by mouth daily. 02/08/18   Bing Neighbors, FNP    Family History Family History  Problem Relation Age of Onset  . Heart failure Mother   . Osteoarthritis Mother   . COPD Sister   . Stroke Sister   . Heart attack Maternal Aunt   . Heart attack Maternal Uncle      Social History Social History   Tobacco Use  . Smoking status: Current Every Day Smoker    Packs/day: 0.50    Years: 17.00    Pack years: 8.50    Types: Cigarettes    Start date: 04/04/1978  . Smokeless tobacco: Never Used  . Tobacco comment: Max 1 ppd  Substance Use Topics  . Alcohol use: Yes    Comment: 2-3 mixed drinks daily  . Drug use: No     Allergies   Patient has no known allergies.   Review of Systems Review of Systems  Respiratory: Positive for shortness of breath.   All other systems reviewed and are negative.    Physical Exam Updated Vital Signs BP (!) 147/71   Pulse 77   Resp 14   Ht 5\' 10"  (1.778 m)   Wt 68.9 kg   SpO2 100%   BMI 21.81 kg/m   Physical Exam Vitals signs and nursing note reviewed.    58 year old male, resting comfortably and in no acute distress. Vital signs are significant for mild elevation of systolic blood pressure. Oxygen saturation is 100%, which is normal. Head is normocephalic and atraumatic. PERRLA, EOMI. Oropharynx is clear. Neck is nontender and supple without adenopathy or JVD. Back is nontender and there is no CVA tenderness. Lungs are diminished airflow with slightly prolonged exhalation phase, but no overt rales, wheezes, or rhonchi. Chest is nontender. Heart has regular rate and rhythm without murmur. Abdomen is soft, flat, nontender without masses or hepatosplenomegaly and peristalsis is normoactive. Extremities have no cyanosis or edema, full range of motion is present. Skin is warm and dry without rash. Neurologic: Mental status is normal, cranial nerves are intact, there are no motor or sensory deficits.  ED Treatments / Results  Labs (all labs ordered are listed, but only abnormal results are displayed) Labs Reviewed  BASIC METABOLIC PANEL  BRAIN NATRIURETIC PEPTIDE  CBC WITH DIFFERENTIAL/PLATELET  TROPONIN I    EKG EKG Interpretation  Date/Time:  Sunday June 03 2018 00:46:05 EST Ventricular  Rate:  82 PR Interval:    QRS Duration: 106 QT Interval:  408 QTC Calculation: 477 R Axis:   78 Text Interpretation:  Sinus rhythm RSR' in V1 or V2, right VCD or RVH Borderline prolonged QT interval Baseline wander in lead(s) V3 When compared with ECG of 12/01/2011, T wave inversion Inferior leads is no longer present QT has lengthened Confirmed by Dione Booze ([id number]) on 06/03/2018 12:48:36 AM   Radiology No results found.  Procedures Procedures   Medications Ordered in ED Medications - No data to display   Initial Impression / Assessment and Plan / ED Course  I have reviewed the triage vital signs and the nursing notes.  Pertinent labs & imaging results that were available during my care of the patient were reviewed  by me and considered in my medical decision making (see chart for details).  Dyspnea of uncertain cause, but clinical response to beta agonist via nebulizer.  Will check chest x-ray and BNP level.  ECG shows no acute process.  Old records are reviewed, and he has no relevant past visits.  Chest x-ray shows no acute process.  Basic metabolic panel is significant for glucose of 203, CO2 of 20 but with normal anion gap.  Hemoglobin is slightly low at 12.7 which is a drop from 14.9, but that was obtained in 2014.  No evidence of acute blood loss.  Following second nebulizer treatment, he is resting comfortably with normal vital signs and clear lungs on exam.  Will ambulate in the ED.  Plan is to discharge him on a short course of prednisone and albuterol inhaler to use as needed if he tolerates ambulation.  He was able to ambulate without any oxygen desaturation.  He is discharged with an albuterol inhaler and prescription for 5-day course of prednisone.  Return precautions discussed.  Encouraged to stop smoking.  Final Clinical Impressions(s) / ED Diagnoses   Final diagnoses:  Shortness of breath  Normochromic normocytic anemia  Acute bronchospasm    ED Discharge Orders          Ordered    predniSONE (DELTASONE) 20 MG tablet  Daily,   Status:  Discontinued     06/03/18 0325    predniSONE (DELTASONE) 20 MG tablet  Daily     06/03/18 0326           Dione Booze, MD 06/03/18 [phone]

## 2018-06-03 NOTE — ED Notes (Signed)
Pt transferred to x ray

## 2018-06-03 NOTE — ED Notes (Signed)
Pt ambulated from [facility] to [facility]. Pt Started at 100% and stayed 100% while ambulating. Pt stated he feels a lot better and he did not get short of breath while walking. Pt did not need any assistance walking. Pt back in bed.

## 2018-06-11 ENCOUNTER — Telehealth (HOSPITAL_COMMUNITY): Payer: Self-pay | Admitting: Emergency Medicine

## 2018-06-11 ENCOUNTER — Encounter: Payer: Self-pay | Admitting: Family Medicine

## 2018-06-11 ENCOUNTER — Ambulatory Visit (INDEPENDENT_AMBULATORY_CARE_PROVIDER_SITE_OTHER): Payer: Self-pay | Admitting: Family Medicine

## 2018-06-11 VITALS — BP 150/81 | HR 72 | Resp 17 | Ht 68.0 in | Wt 151.6 lb

## 2018-06-11 DIAGNOSIS — I10 Essential (primary) hypertension: Secondary | ICD-10-CM

## 2018-06-11 DIAGNOSIS — E1165 Type 2 diabetes mellitus with hyperglycemia: Secondary | ICD-10-CM

## 2018-06-11 DIAGNOSIS — E119 Type 2 diabetes mellitus without complications: Secondary | ICD-10-CM

## 2018-06-11 DIAGNOSIS — I739 Peripheral vascular disease, unspecified: Secondary | ICD-10-CM

## 2018-06-11 LAB — GLUCOSE, POCT (MANUAL RESULT ENTRY): POC Glucose: 279 mg/dl — AB (ref 70–99)

## 2018-06-11 MED ORDER — GLIPIZIDE 5 MG PO TABS: 5.0000 mg | ORAL_TABLET | Freq: Every day | ORAL | 3 refills | Status: DC

## 2018-06-11 MED ORDER — INSULIN GLARGINE 100 UNIT/ML SOLOSTAR PEN: 20.0000 [IU] | PEN_INJECTOR | Freq: Every day | SUBCUTANEOUS | 11 refills | Status: DC

## 2018-06-11 MED ORDER — INSULIN PEN NEEDLE 29G X 12.7MM MISC: 2 refills | Status: DC

## 2018-06-11 NOTE — Patient Instructions (Addendum)
Start Lantus 20 units daily at bedtime. Continue glipizide 5 mg however only once daily with breakfast or lunch. Continue metformin as prescribed.    Diabetes Basics  Diabetes (diabetes mellitus) is a long-term (chronic) disease. It occurs when the body does not properly use sugar (glucose) that is released from food after you eat. Diabetes may be caused by one or both of these problems:  Your pancreas does not make enough of a hormone called insulin.  Your body does not react in a normal way to insulin that it makes. Insulin lets sugars (glucose) go into cells in your body. This gives you energy. If you have diabetes, sugars cannot get into cells. This causes high blood sugar (hyperglycemia). Follow these instructions at home: How is diabetes treated? You may need to take insulin or other diabetes medicines daily to keep your blood sugar in balance. Take your diabetes medicines every day as told by your doctor. List your diabetes medicines here: Diabetes medicines  Name of medicine: ______________________________ ? Amount (dose): _______________ Time (a.m./p.m.): _______________ Notes: ___________________________________  Name of medicine: ______________________________ ? Amount (dose): _______________ Time (a.m./p.m.): _______________ Notes: ___________________________________  Name of medicine: ______________________________ ? Amount (dose): _______________ Time (a.m./p.m.): _______________ Notes: ___________________________________ If you use insulin, you will learn how to give yourself insulin by injection. You may need to adjust the amount based on the food that you eat. List the types of insulin you use here: Insulin  Insulin type: ______________________________ ? Amount (dose): _______________ Time (a.m./p.m.): _______________ Notes: ___________________________________  Insulin type: ______________________________ ? Amount (dose): _______________ Time (a.m./p.m.):  _______________ Notes: ___________________________________  Insulin type: ______________________________ ? Amount (dose): _______________ Time (a.m./p.m.): _______________ Notes: ___________________________________  Insulin type: ______________________________ ? Amount (dose): _______________ Time (a.m./p.m.): _______________ Notes: ___________________________________  Insulin type: ______________________________ ? Amount (dose): _______________ Time (a.m./p.m.): _______________ Notes: ___________________________________ How do I manage my blood sugar?  Check your blood sugar levels using a blood glucose monitor as directed by your doctor. Your doctor will set treatment goals for you. Generally, you should have these blood sugar levels:  Before meals (preprandial): 80-130 mg/dL (2.4-1.1 mmol/L).  After meals (postprandial): below 180 mg/dL (10 mmol/L).  A1c level: less than 7%. Write down the times that you will check your blood sugar levels: Blood sugar checks  Time: _______________ Notes: ___________________________________  Time: _______________ Notes: ___________________________________  Time: _______________ Notes: ___________________________________  Time: _______________ Notes: ___________________________________  Time: _______________ Notes: ___________________________________  Time: _______________ Notes: ___________________________________  What do I need to know about low blood sugar? Low blood sugar is called hypoglycemia. This is when blood sugar is at or below 70 mg/dL (3.9 mmol/L). Symptoms may include:  Feeling: ? Hungry. ? Worried or nervous (anxious). ? Sweaty and clammy. ? Confused. ? Dizzy. ? Sleepy. ? Sick to your stomach (nauseous).  Having: ? A fast heartbeat. ? A headache. ? A change in your vision. ? Tingling or no feeling (numbness) around the mouth, lips, or tongue. ? Jerky movements that you cannot control (seizure).  Having trouble  with: ? Moving (coordination). ? Sleeping. ? Passing out (fainting). ? Getting upset easily (irritability). Treating low blood sugar To treat low blood sugar, eat or drink something sugary right away. If you can think clearly and swallow safely, follow the 15:15 rule:  Take 15 grams of a fast-acting carb (carbohydrate). Talk with your doctor about how much you should take.  Some fast-acting carbs are: ? Sugar tablets (glucose pills). Take 3-4 glucose pills. ? 6-8 pieces of hard candy. ?  4-6 oz (120-150 mL) of fruit juice. ? 4-6 oz (120-150 mL) of regular (not diet) soda. ? 1 Tbsp (15 mL) honey or sugar.  Check your blood sugar 15 minutes after you take the carb.  If your blood sugar is still at or below 70 mg/dL (3.9 mmol/L), take 15 grams of a carb again.  If your blood sugar does not go above 70 mg/dL (3.9 mmol/L) after 3 tries, get help right away.  After your blood sugar goes back to normal, eat a meal or a snack within 1 hour. Treating very low blood sugar If your blood sugar is at or below 54 mg/dL (3 mmol/L), you have very low blood sugar (severe hypoglycemia). This is an emergency. Do not wait to see if the symptoms will go away. Get medical help right away. Call your local emergency services (911 in the [place] Do not drive yourself to the hospital. Questions to ask your health care provider  Do I need to meet with a diabetes educator?  What equipment will I need to care for myself at home?  What diabetes medicines do I need? When should I take them?  How often do I need to check my blood sugar?  What number can I call if I have questions?  When is my next doctor's visit?  Where can I find a support group for people with diabetes? Where to find more information  American Diabetes Association: www.diabetes.org  American Association of Diabetes Educators: www.diabeteseducator.org/patient-resources Contact a doctor if:  Your blood sugar is at or above 240 mg/dL  (82.7 mmol/L) for 2 days in a row.  You have been sick or have had a fever for 2 days or more, and you are not getting better.  You have any of these problems for more than 6 hours: ? You cannot eat or drink. ? You feel sick to your stomach (nauseous). ? You throw up (vomit). ? You have watery poop (diarrhea). Get help right away if:  Your blood sugar is lower than 54 mg/dL (3 mmol/L).  You get confused.  You have trouble: ? Thinking clearly. ? Breathing. Summary  Diabetes (diabetes mellitus) is a long-term (chronic) disease. It occurs when the body does not properly use sugar (glucose) that is released from food after digestion.  Take insulin and diabetes medicines as told.  Check your blood sugar every day, as often as told.  Keep all follow-up visits as told by your doctor. This is important. This information is not intended to replace advice given to you by your health care provider. Make sure you discuss any questions you have with your health care provider. Document Released: 06/23/2017 Document Revised: 09/11/2017 Document Reviewed: 06/23/2017 Elsevier Interactive Patient Education  2019 [employer].

## 2018-06-11 NOTE — Telephone Encounter (Signed)
Left message on voicemail with name and callback number Gideon Burstein RN Navigator [facility] [phone] Office [phone] Cell  

## 2018-06-11 NOTE — Progress Notes (Signed)
Patient ID: Dylan Anderson, male    DOB: 1960/10/01, 58 y.o.   MRN: [id number]  PCP: Bing NeighborsHarris, Kaiya Boatman S, FNP  Chief Complaint  Patient presents with  . Diabetes  . Hypertension    Subjective:  HPI  Dylan IdaBill Herbster is a 58 y.o. male presents for diabetes and hypertension.  Follow-up Glucose Home readings average mid 100-mid 200's.  He has had 2 elevated glucose readings since last office visit extremely high 520 and 599 (after a dose prednisone patient received while in the ER). He is currently prescribed glipizide and metformin due to no health insurance.  We discussed at last office visit if readings were not consistently less than 200 we would need to add insulin.  Patient was advised of medication assistance available [facility].  He is in agreement today to start the Lantus.  Also since her last visit he has followed up with cardiology and is scheduled to undergo a coronary artery study tomorrow to evaluate underlying cause of chronic persistent chest pain.  Patient does suffer from underlying COPD and was recently transported to the ER for a COPD with shortness of breath exacerbation.  He currently has inhalers and has been doing well since that time.  Unfortunately he continues to smoke and have a chronic cough.  He has not experienced any wheezing or shortness of breath since discharge from the ER. Social History   Socioeconomic History  . Marital status: Legally Separated    Spouse name: Not on file  . Number of children: Not on file  . Years of education: Not on file  . Highest education level: Not on file  Occupational History  . Occupation: unemployed  Social Needs  . Financial resource strain: Not on file  . Food insecurity:    Worry: Not on file    Inability: Not on file  . Transportation needs:    Medical: Not on file    Non-medical: Not on file  Tobacco Use  . Smoking status: Current Every Day Smoker    Packs/day: 0.50    Years: 17.00     Pack years: 8.50    Types: Cigarettes    Start date: 04/04/1978  . Smokeless tobacco: Never Used  . Tobacco comment: Max 1 ppd  Substance and Sexual Activity  . Alcohol use: Yes    Comment: 2-3 mixed drinks daily  . Drug use: No  . Sexual activity: Yes    Partners: Female  Lifestyle  . Physical activity:    Days per week: Not on file    Minutes per session: Not on file  . Stress: Not on file  Relationships  . Social connections:    Talks on phone: Not on file    Gets together: Not on file    Attends religious service: Not on file    Active member of club or organization: Not on file    Attends meetings of clubs or organizations: Not on file    Relationship status: Not on file  . Intimate partner violence:    Fear of current or ex partner: Not on file    Emotionally abused: Not on file    Physically abused: Not on file    Forced sexual activity: Not on file  Other Topics Concern  . Not on file  Social History Narrative  . Not on file    Family History  Problem Relation Age of Onset  . Heart failure Mother   . Osteoarthritis Mother   .  COPD Sister   . Stroke Sister   . Heart attack Maternal Aunt   . Heart attack Maternal Uncle    Review of Systems Pertinent negatives listed in HPI Patient Active Problem List   Diagnosis Date Noted  . PVC's (premature ventricular contractions) 05/28/2018  . Chest pain in adult 05/26/2018  . Claudication (HCC) 11/05/2017  . Acute gout involving toe of left foot 09/21/2017  . Chronic cough 07/13/2017  . Healthcare maintenance 07/12/2017  . Peripheral artery disease (HCC) 06/07/2017  . Hip pain 05/20/2017  . Gastroesophageal reflux disease 04/27/2017  . Hand pain 03/18/2017  . Insomnia 02/06/2017  . Hyperlipidemia 01/25/2017  . Erectile dysfunction 01/25/2017  . Low back pain 01/25/2017  . Diabetes mellitus type II, non insulin dependent (HCC) 07/10/2012  . Hypertension 07/10/2012  . Tobacco abuse 07/10/2012    No Known  Allergies  Prior to Admission medications   Medication Sig Start Date End Date Taking? Authorizing Provider  aspirin EC 81 MG tablet Take 1 tablet (81 mg total) by mouth daily. 05/28/18  Yes Baldo Daub, MD  atorvastatin (LIPITOR) 40 MG tablet Take 2 tablets (80 mg total) by mouth daily at 6 PM. 02/13/18 05/28/20 Yes Bing Neighbors, FNP  gabapentin (NEURONTIN) 300 MG capsule Take 1 capsule (300 mg total) by mouth 3 (three) times daily. 05/11/18  Yes Bing Neighbors, FNP  glipiZIDE (GLUCOTROL) 5 MG tablet Take 1 tablet (5 mg total) by mouth 2 (two) times daily before a meal. 05/11/18  Yes Bing Neighbors, FNP  losartan (COZAAR) 50 MG tablet Take 2 tablets (100 mg total) by mouth daily. 05/14/18  Yes Bing Neighbors, FNP  metFORMIN (GLUCOPHAGE) 500 MG tablet Take 2 tablets (1,000 mg total) by mouth 2 (two) times daily with a meal. 08/03/17  Yes Hensel, Santiago Bumpers, MD  pantoprazole (PROTONIX) 20 MG tablet Take 1 tablet (20 mg total) by mouth daily. 05/11/18  Yes Bing Neighbors, FNP  tamsulosin (FLOMAX) 0.4 MG CAPS capsule Take 1 capsule (0.4 mg total) by mouth daily. 02/08/18  Yes Bing Neighbors, FNP  metoprolol tartrate (LOPRESSOR) 50 MG tablet Take 2 tablets two hours prior to procedure. Patient not taking: Reported on 06/11/2018 05/28/18   Baldo Daub, MD    Past Medical, Surgical Family and Social History reviewed and updated.    Objective:   Today's Vitals   06/11/18 1451 06/11/18 1520  BP: (!) 165/89 (!) 150/81  Pulse: 72   Resp: 17   SpO2: 98%   Weight: 151 lb 9.6 oz (68.8 kg)   Height: 5\' 8"  (1.727 m)     Wt Readings from Last 3 Encounters:  06/11/18 151 lb 9.6 oz (68.8 kg)  06/03/18 152 lb (68.9 kg)  05/28/18 152 lb (68.9 kg)     Physical Exam General appearance: alert, well developed, well nourished, cooperative and in no distress Head: Normocephalic, without obvious abnormality, atraumatic Respiratory: Respirations even and unlabored, diminished lung  sound throughout, no wheezing or rhonchi noted. Heart: rate and rhythm normal. No gallop or murmurs noted on exam  Abdomen: BS +, no distention, no rebound tenderness, or no mass Extremities: No gross deformities Skin: Skin color, texture, turgor normal. No rashes seen  Psych: Appropriate mood and affect. Neurologic: Mental status: Alert, oriented to person, place, and time, thought content appropriate.  Lab Results  Component Value Date   POCGLU 279 (A) 06/11/2018   POCGLU 290 (A) 05/11/2018   POCGLU 158 (A) 01/13/2017  Lab Results  Component Value Date   HGBA1C 8.9 (H) 05/11/2018    Assessment & Plan:  1. Diabetes mellitus type II, non insulin dependent (HCC) Given persist uncontrolled diabetes, patient has agreed to finally resume insulin We will start Lantus 20 units at bedtime and titrate with a goal of blood sugar readings consistently less than 150. Discussed ways to prevent hypoglycemia-high protein snack prior to bedtime, avoid skipping meals, and take glipizide only with a breakfast or lunch meal. - Glucose (CBG)-279  2. Peripheral artery disease (HCC) -Quit smoking  -need to improve control of blood pressure   3. Essential hypertension -uncontrolled today-patient has drank several cups of coffee and smoked priort o visit. -he will follow-up in 1 month-likely will add amlodipine 5   Meds ordered this encounter  Medications  . Insulin Glargine (LANTUS) 100 UNIT/ML Solostar Pen    Sig: Inject 20 Units into the skin at bedtime.    Dispense:  15 mL    Refill:  11  . Insulin Pen Needle (BD ULTRA-FINE PEN NEEDLES) 29G X 12.7MM MISC    Sig: ICD10 E11.9 for use with insulin administration    Dispense:  200 each    Refill:  2  . glipiZIDE (GLUCOTROL) 5 MG tablet    Sig: Take 1 tablet (5 mg total) by mouth daily before breakfast.    Dispense:  60 tablet    Refill:  3    -The patient was given clear instructions to go to ER or return to medical center if symptoms do  not improve, worsen or new problems develop. The patient verbalized understanding.    Joaquin Courts, FNP [facility] [address], [place] [address] [phone]: [phone]

## 2018-06-12 ENCOUNTER — Ambulatory Visit (HOSPITAL_COMMUNITY)
Admission: RE | Admit: 2018-06-12 | Discharge: 2018-06-12 | Disposition: A | Discharge status: Home / Self Care | Payer: Self-pay | Source: Ambulatory Visit | Attending: Cardiology | Admitting: Cardiology

## 2018-06-12 ENCOUNTER — Encounter (HOSPITAL_COMMUNITY): Payer: Self-pay

## 2018-06-12 DIAGNOSIS — R079 Chest pain, unspecified: Secondary | ICD-10-CM | POA: Insufficient documentation

## 2018-06-12 MED ORDER — NITROGLYCERIN 0.4 MG SL SUBL
0.8000 mg | SUBLINGUAL_TABLET | Freq: Once | SUBLINGUAL | Status: AC
  Administered 2018-06-12: 0.8 mg via SUBLINGUAL
  Filled 2018-06-12: qty 25

## 2018-06-12 MED ORDER — NITROGLYCERIN 0.4 MG SL SUBL
SUBLINGUAL_TABLET | SUBLINGUAL | Status: AC
  Filled 2018-06-12: qty 2

## 2018-06-12 MED ORDER — IOHEXOL 350 MG/ML SOLN
80.0000 mL | Freq: Once | INTRAVENOUS | Status: AC | PRN
  Administered 2018-06-12: 80 mL via INTRAVENOUS

## 2018-06-12 MED FILL — !LANTUS SOLOSTAR 100UNITS/M: 100 | 30 days supply | Qty: 6 | Fill #0

## 2018-06-12 NOTE — Discharge Instructions (Signed)
What You Need to Know About IV Contrast Material °IV contrast material is most often a fluid that is used with some imaging tests. Contrast material is injected into your body through a vein to help your health care providers see your organs and tissues more clearly. It may be used with: °· X-ray. °· MRI. °· CT. °· Ultrasound. °Contrast material is used when your health care providers need a detailed look at organs, tissues, or blood vessels that may not show up with the standard test. IV contrast may be used for imaging tests that examine: °· Muscles, skin, and fat. °· Breasts. °· Brain. °· Digestive tract. °· Heart. °· Liver. °· Lungs and many other internal organs. °What are the risks of using IV contrast material? °The risks of using IV contrast material include: °· Headache. °· Itching, skin rash, and hives. °· Allergic reactions. °· Nausea and vomiting. °· Wheezing or difficulty breathing. °· Abnormal heart rate. °· Blood pressure changes. °· Throat swelling. °· Kidney damage. °These complications are more likely to occur in people who: °· Have kidney failure. °· Have liver problems. °· Have certain heart problems, including: °? Heart failure. °? Heart attack. °? Heart infection. °? Heart valve problems. °· Abuse alcohol. °· Have allergies or asthma. °· Are dehydrated. °· Have sickle cell anemia or similar problems. °· Have had trouble with IV contrast material in the past. °· Take certain medicines, such as: °? Metformin. °? NSAIDs. °? Beta blockers. °? Interleukin-2. °How do I prepare for my test with IV contrast material? °· Follow instructions from your health care provider about eating or drinking restrictions. °· Ask your health care provider about changing or stopping your regular medicines. This is especially important if you are taking diabetes medicines or blood thinners. °· Tell your health care provider about: °? Any previous illnesses, surgeries, or pre-existing medical conditions. °? Whether you  are pregnant or may be pregnant. °? Whether you are breastfeeding. Most contrast agents are safe for use in breastfeeding women. °· You may have a physical exam to determine any potential risks. °· Ask if you will be given a medicine (sedative) to help you relax during the procedure. If so, plan to have someone take you home after test. °What happens during the test with IV contrast material? ° °· You may be given a sedative to help you relax. °· A needle will be inserted into one of your veins to administer the IV contrast material. °· You may feel warmth or flushing as the material enters your bloodstream. °· You may have a metallic taste in your mouth for a few minutes. °· The needle may cause some discomfort and bruising. °· After the contrast material is in your body, the imaging test will be done. °The procedure may vary among health care providers and hospitals. °What happens after the test with IV contrast material? °· You may be asked to drink water or other fluids to wash (flush) the contrast material out of your body. °· Drink enough fluid to keep your urine pale yellow. °· Do not drive for 24 hours if you received a sedative. °· It is your responsibility to get your test results. Ask your health care provider or the department performing the test when your results will be ready. °Contact a health care provider if: °· You have redness, swelling, or pain near your IV site. °Get help right away if: °· You have an abnormal heart rhythm. °· You have trouble breathing. °· You have: °?   Chest pain. °? Pain in your back, neck, arm, jaw, or stomach. °? Nausea or sweating. °? Hives or a rash. °· You start shaking and cannot stop. °These symptoms may represent a serious problem that is an emergency. Do not wait to see if the symptoms will go away. Get medical help right away. Call your local emergency services (911 in the [place] Do not drive yourself to the hospital. °Summary °· IV contrast may be used for imaging  tests to help your health care providers see your organs and tissues more clearly. °· Tell your health care provider if you are pregnant or may be pregnant. °· During the procedure, you may feel warmth or flushing as the material enters your bloodstream. °· After the procedure, drink enough fluid to keep your urine pale yellow. °This information is not intended to replace advice given to you by your health care provider. Make sure you discuss any questions you have with your health care provider. °Document Released: 03/09/2009 Document Revised: 11/13/2017 Document Reviewed: 11/26/2014 °Elsevier Interactive Patient Education © 2019 [employer] ° ° °Cardiac CT Angiogram ° °A cardiac CT angiogram is a procedure to look at the heart and the area around the heart. It may be done to help find the cause of chest pains or other symptoms of heart disease. During this procedure, a large X-ray machine, called a CT scanner, takes detailed pictures of the heart and the surrounding area after a dye (contrast material) has been injected into blood vessels in the area. The procedure is also sometimes called a coronary CT angiogram, coronary artery scanning, or CTA. °A cardiac CT angiogram allows the health care provider to see how well blood is flowing to and from the heart. The health care provider will be able to see if there are any problems, such as: °· Blockage or narrowing of the coronary arteries in the heart. °· Fluid around the heart. °· Signs of weakness or disease in the muscles, valves, and tissues of the heart. °Tell a health care provider about: °· Any allergies you have. This is especially important if you have had a previous allergic reaction to contrast dye. °· All medicines you are taking, including vitamins, herbs, eye drops, creams, and over-the-counter medicines. °· Any blood disorders you have. °· Any surgeries you have had. °· Any medical conditions you have. °· Whether you are pregnant or may be  pregnant. °· Any anxiety disorders, chronic pain, or other conditions you have that may increase your stress or prevent you from lying still. °What are the risks? °Generally, this is a safe procedure. However, problems may occur, including: °· Bleeding. °· Infection. °· Allergic reactions to medicines or dyes. °· Damage to other structures or organs. °· Kidney damage from the dye or contrast that is used. °· Increased risk of cancer from radiation exposure. This risk is low. Talk with your health care provider about: °? The risks and benefits of testing. °? How you can receive the lowest dose of radiation. °What happens before the procedure? °· Wear comfortable clothing and remove any jewelry, glasses, dentures, and hearing aids. °· Follow instructions from your health care provider about eating and drinking. This may include: °? For 12 hours before the test -- avoid caffeine. This includes tea, coffee, soda, energy drinks, and diet pills. Drink plenty of water or other fluids that do not have caffeine in them. Being well-hydrated can prevent complications. °? For 4-6 hours before the test -- stop eating and drinking. The   contrast dye can cause nausea, but this is less likely if your stomach is empty. °· Ask your health care provider about changing or stopping your regular medicines. This is especially important if you are taking diabetes medicines, blood thinners, or medicines to treat erectile dysfunction. °What happens during the procedure? °· Hair on your chest may need to be removed so that small sticky patches called electrodes can be placed on your chest. These will transmit information that helps to monitor your heart during the test. °· An IV tube will be inserted into one of your veins. °· You might be given a medicine to control your heart rate during the test. This will help to ensure that good images are obtained. °· You will be asked to lie on an exam table. This table will slide in and out of the CT  machine during the procedure. °· Contrast dye will be injected into the IV tube. You might feel warm, or you may get a metallic taste in your mouth. °· You will be given a medicine (nitroglycerin) to relax (dilate) the arteries in your heart. °· The table that you are lying on will move into the CT machine tunnel for the scan. °· The person running the machine will give you instructions while the scans are being done. You may be asked to: °? Keep your arms above your head. °? Hold your breath. °? Stay very still, even if the table is moving. °· When the scanning is complete, you will be moved out of the machine. °· The IV tube will be removed. °The procedure may vary among health care providers and hospitals. °What happens after the procedure? °· You might feel warm, or you may get a metallic taste in your mouth from the contrast dye. °· You may have a headache from the nitroglycerin. °· After the procedure, drink water or other fluids to wash (flush) the contrast material out of your body. °· Contact a health care provider if you have any symptoms of allergy to the contrast. These symptoms include: °? Shortness of breath. °? Rash or hives. °? A racing heartbeat. °· Most people can return to their normal activities right after the procedure. Ask your health care provider what activities are safe for you. °· It is up to you to get the results of your procedure. Ask your health care provider, or the department that is doing the procedure, when your results will be ready. °Summary °· A cardiac CT angiogram is a procedure to look at the heart and the area around the heart. It may be done to help find the cause of chest pains or other symptoms of heart disease. °· During this procedure, a large X-ray machine, called a CT scanner, takes detailed pictures of the heart and the surrounding area after a dye (contrast material) has been injected into blood vessels in the area. °· Ask your health care provider about changing or  stopping your regular medicines before the procedure. This is especially important if you are taking diabetes medicines, blood thinners, or medicines to treat erectile dysfunction. °· After the procedure, drink water or other fluids to wash (flush) the contrast material out of your body. °This information is not intended to replace advice given to you by your health care provider. Make sure you discuss any questions you have with your health care provider. °Document Released: 03/03/2008 Document Revised: 02/08/2016 Document Reviewed: 02/08/2016 °Elsevier Interactive Patient Education © 2019 [employer] ° °

## 2018-06-12 NOTE — Progress Notes (Signed)
Pt tolerated exam without incident.  PIV removed and dressing applied.  Discharge instructions discussed and pt given written instructions on IV Contrast admin and Cardiac CTA.  Pt discharged

## 2018-06-13 ENCOUNTER — Encounter: Payer: Self-pay | Admitting: *Deleted

## 2018-06-13 ENCOUNTER — Telehealth: Payer: Self-pay | Admitting: *Deleted

## 2018-06-13 DIAGNOSIS — Z01818 Encounter for other preprocedural examination: Secondary | ICD-10-CM

## 2018-06-13 DIAGNOSIS — R079 Chest pain, unspecified: Secondary | ICD-10-CM

## 2018-06-13 DIAGNOSIS — E119 Type 2 diabetes mellitus without complications: Secondary | ICD-10-CM

## 2018-06-13 MED FILL — TRUEplus 5-BEVEL PEN NEEDLE: 31G X 8 MM | 20 days supply | Qty: 100 | Fill #0

## 2018-06-13 NOTE — Telephone Encounter (Signed)
Patient informed by Dr. Dulce Sellar of abnormal cardiac CTA results and advised that a heart catheterization is recommended. Patient is agreeable and advised that he would be contacted with further information regarding heart catheterization.  Patient informed that he has been scheduled for a heart catheterization on Tuesday, 06/19/2018, at 7:30 am with Dr. Tresa Endo. Reviewed all instructions extensively on the phone with patient and advised that a copy would be available for pick up at the front desk in the [facility] location when he returns to the office for pre-procedure lab work. Patient informed that no appointment was needed for lab work. Patient verbalized understanding. No further questions.

## 2018-06-13 NOTE — Addendum Note (Signed)
Addended by: Norman Herrlich on: 06/13/2018 04:31 PM   Modules accepted: Orders, SmartSet

## 2018-06-16 LAB — BASIC METABOLIC PANEL
BUN/Creatinine Ratio: 16 (ref 9–20)
BUN: 21 mg/dL (ref 6–24)
CO2: 20 mmol/L (ref 20–29)
Calcium: 9.6 mg/dL (ref 8.7–10.2)
Chloride: 98 mmol/L (ref 96–106)
Creatinine, Ser: 1.28 mg/dL — ABNORMAL HIGH (ref 0.76–1.27)
GFR calc Af Amer: 72 mL/min/{1.73_m2} (ref >59)
GFR calc non Af Amer: 62 mL/min/{1.73_m2} (ref >59)
Glucose: 183 mg/dL — ABNORMAL HIGH (ref 65–99)
Potassium: 4.2 mmol/L (ref 3.5–5.2)
Sodium: 138 mmol/L (ref 134–144)

## 2018-06-18 ENCOUNTER — Telehealth: Payer: Self-pay | Admitting: *Deleted

## 2018-06-18 NOTE — Telephone Encounter (Signed)
Pt contacted pre-catheterization scheduled at [facility] for: Tuesday June 19, 2018 7:30 AM Verified arrival time and place: [facility] Main Entrance A at:  No solid food after midnight prior to cath, clear liquids until 5 AM day of procedure. Contrast allergy: no  Hold: Metformin-day of procedure and 48 hours post procedure. Glipizide-AM of procedure. 1/2 Insulin-PM prior to procedure.  Except hold medications AM meds can be  taken pre-cath with sip of water including: ASA 81 mg  Confirmed patient has responsible person to drive home post procedure and observe 24 hours after arriving home: yes

## 2018-06-19 ENCOUNTER — Other Ambulatory Visit: Payer: Self-pay

## 2018-06-19 ENCOUNTER — Encounter (HOSPITAL_COMMUNITY)
Admission: RE | Disposition: A | Discharge status: Home / Self Care | Payer: Self-pay | Source: Ambulatory Visit | Attending: Cardiovascular Disease

## 2018-06-19 ENCOUNTER — Ambulatory Visit (HOSPITAL_COMMUNITY)
Admission: RE | Admit: 2018-06-19 | Discharge: 2018-06-19 | Disposition: A | Discharge status: Home / Self Care | Payer: Self-pay | Source: Ambulatory Visit | Attending: Cardiovascular Disease | Admitting: Cardiovascular Disease

## 2018-06-19 ENCOUNTER — Telehealth: Payer: Self-pay | Admitting: Family Medicine

## 2018-06-19 ENCOUNTER — Encounter (HOSPITAL_COMMUNITY): Payer: Self-pay | Admitting: Cardiovascular Disease

## 2018-06-19 DIAGNOSIS — Z95820 Peripheral vascular angioplasty status with implants and grafts: Secondary | ICD-10-CM | POA: Insufficient documentation

## 2018-06-19 DIAGNOSIS — E7800 Pure hypercholesterolemia, unspecified: Secondary | ICD-10-CM | POA: Insufficient documentation

## 2018-06-19 DIAGNOSIS — I2510 Atherosclerotic heart disease of native coronary artery without angina pectoris: Secondary | ICD-10-CM | POA: Insufficient documentation

## 2018-06-19 DIAGNOSIS — Z8262 Family history of osteoporosis: Secondary | ICD-10-CM | POA: Insufficient documentation

## 2018-06-19 DIAGNOSIS — Z8249 Family history of ischemic heart disease and other diseases of the circulatory system: Secondary | ICD-10-CM | POA: Insufficient documentation

## 2018-06-19 DIAGNOSIS — Z823 Family history of stroke: Secondary | ICD-10-CM | POA: Insufficient documentation

## 2018-06-19 DIAGNOSIS — I493 Ventricular premature depolarization: Secondary | ICD-10-CM | POA: Insufficient documentation

## 2018-06-19 DIAGNOSIS — Z7982 Long term (current) use of aspirin: Secondary | ICD-10-CM | POA: Insufficient documentation

## 2018-06-19 DIAGNOSIS — R931 Abnormal findings on diagnostic imaging of heart and coronary circulation: Secondary | ICD-10-CM

## 2018-06-19 DIAGNOSIS — I2584 Coronary atherosclerosis due to calcified coronary lesion: Secondary | ICD-10-CM | POA: Insufficient documentation

## 2018-06-19 DIAGNOSIS — M1990 Unspecified osteoarthritis, unspecified site: Secondary | ICD-10-CM | POA: Insufficient documentation

## 2018-06-19 DIAGNOSIS — K219 Gastro-esophageal reflux disease without esophagitis: Secondary | ICD-10-CM | POA: Insufficient documentation

## 2018-06-19 DIAGNOSIS — I10 Essential (primary) hypertension: Secondary | ICD-10-CM | POA: Insufficient documentation

## 2018-06-19 DIAGNOSIS — Z7984 Long term (current) use of oral hypoglycemic drugs: Secondary | ICD-10-CM | POA: Insufficient documentation

## 2018-06-19 DIAGNOSIS — E1151 Type 2 diabetes mellitus with diabetic peripheral angiopathy without gangrene: Secondary | ICD-10-CM | POA: Insufficient documentation

## 2018-06-19 DIAGNOSIS — G4700 Insomnia, unspecified: Secondary | ICD-10-CM | POA: Insufficient documentation

## 2018-06-19 DIAGNOSIS — R0602 Shortness of breath: Secondary | ICD-10-CM | POA: Insufficient documentation

## 2018-06-19 DIAGNOSIS — R079 Chest pain, unspecified: Secondary | ICD-10-CM

## 2018-06-19 DIAGNOSIS — Z79899 Other long term (current) drug therapy: Secondary | ICD-10-CM | POA: Insufficient documentation

## 2018-06-19 DIAGNOSIS — F17210 Nicotine dependence, cigarettes, uncomplicated: Secondary | ICD-10-CM | POA: Insufficient documentation

## 2018-06-19 DIAGNOSIS — E785 Hyperlipidemia, unspecified: Secondary | ICD-10-CM | POA: Insufficient documentation

## 2018-06-19 HISTORY — PX: LEFT HEART CATH AND CORONARY ANGIOGRAPHY: CATH118249

## 2018-06-19 LAB — GLUCOSE, CAPILLARY: Glucose-Capillary: 354 mg/dL — ABNORMAL HIGH (ref 70–99)

## 2018-06-19 SURGERY — LEFT HEART CATH AND CORONARY ANGIOGRAPHY
Anesthesia: LOCAL

## 2018-06-19 MED ORDER — FENTANYL CITRATE (PF) 100 MCG/2ML IJ SOLN
INTRAMUSCULAR | Status: AC
  Filled 2018-06-19: qty 2

## 2018-06-19 MED ORDER — ASPIRIN 81 MG PO CHEW: 81.0000 mg | CHEWABLE_TABLET | ORAL | Status: AC

## 2018-06-19 MED ORDER — ONDANSETRON HCL 4 MG/2ML IJ SOLN: 4.0000 mg | Freq: Four times a day (QID) | INTRAMUSCULAR | Status: DC | PRN

## 2018-06-19 MED ORDER — INSULIN GLARGINE 100 UNIT/ML SOLOSTAR PEN: 20.0000 [IU] | PEN_INJECTOR | Freq: Two times a day (BID) | SUBCUTANEOUS | 11 refills | Status: DC

## 2018-06-19 MED ORDER — DIAZEPAM 5 MG PO TABS: 5.0000 mg | ORAL_TABLET | Freq: Four times a day (QID) | ORAL | Status: DC | PRN

## 2018-06-19 MED ORDER — HEPARIN (PORCINE) IN NACL 1000-0.9 UT/500ML-% IV SOLN
INTRAVENOUS | Status: AC
  Filled 2018-06-19: qty 1000

## 2018-06-19 MED ORDER — SODIUM CHLORIDE 0.9 % IV SOLN: 250.0000 mL | INTRAVENOUS | Status: DC | PRN

## 2018-06-19 MED ORDER — SODIUM CHLORIDE 0.9% FLUSH: 3.0000 mL | INTRAVENOUS | Status: DC | PRN

## 2018-06-19 MED ORDER — LIDOCAINE HCL (PF) 1 % IJ SOLN
INTRAMUSCULAR | Status: DC | PRN
  Administered 2018-06-19: 2 mL via SUBCUTANEOUS

## 2018-06-19 MED ORDER — INSULIN ASPART 100 UNIT/ML ~~LOC~~ SOLN
5.0000 [IU] | Freq: Once | SUBCUTANEOUS | Status: AC
  Administered 2018-06-19: 5 [IU] via SUBCUTANEOUS
  Filled 2018-06-19: qty 0.05

## 2018-06-19 MED ORDER — SODIUM CHLORIDE 0.9 % WEIGHT BASED INFUSION: 1.0000 mL/kg/h | INTRAVENOUS | Status: DC

## 2018-06-19 MED ORDER — ACETAMINOPHEN 325 MG PO TABS: 650.0000 mg | ORAL_TABLET | ORAL | Status: DC | PRN

## 2018-06-19 MED ORDER — HEPARIN (PORCINE) IN NACL 1000-0.9 UT/500ML-% IV SOLN
INTRAVENOUS | Status: DC | PRN
  Administered 2018-06-19 (×2): 500 mL

## 2018-06-19 MED ORDER — VERAPAMIL HCL 2.5 MG/ML IV SOLN
INTRAVENOUS | Status: AC
  Filled 2018-06-19: qty 2

## 2018-06-19 MED ORDER — SODIUM CHLORIDE 0.9% FLUSH: 3.0000 mL | Freq: Two times a day (BID) | INTRAVENOUS | Status: DC

## 2018-06-19 MED ORDER — SODIUM CHLORIDE 0.9 % WEIGHT BASED INFUSION
3.0000 mL/kg/h | INTRAVENOUS | Status: AC
  Administered 2018-06-19: 3 mL/kg/h via INTRAVENOUS

## 2018-06-19 MED ORDER — SODIUM CHLORIDE 0.9 % IV SOLN: INTRAVENOUS | Status: DC

## 2018-06-19 MED ORDER — HEPARIN SODIUM (PORCINE) 1000 UNIT/ML IJ SOLN
INTRAMUSCULAR | Status: DC | PRN
  Administered 2018-06-19: 3500 [IU] via INTRAVENOUS

## 2018-06-19 MED ORDER — IOHEXOL 350 MG/ML SOLN
INTRAVENOUS | Status: DC | PRN
  Administered 2018-06-19: 85 mL via INTRAVENOUS

## 2018-06-19 MED ORDER — VERAPAMIL HCL 2.5 MG/ML IV SOLN
INTRAVENOUS | Status: DC | PRN
  Administered 2018-06-19: 08:00:00 via INTRA_ARTERIAL

## 2018-06-19 MED ORDER — ASPIRIN 81 MG PO CHEW: 81.0000 mg | CHEWABLE_TABLET | Freq: Every day | ORAL | Status: DC

## 2018-06-19 MED ORDER — MIDAZOLAM HCL 2 MG/2ML IJ SOLN
INTRAMUSCULAR | Status: DC | PRN
  Administered 2018-06-19: 2 mg via INTRAVENOUS

## 2018-06-19 MED ORDER — HEPARIN SODIUM (PORCINE) 1000 UNIT/ML IJ SOLN
INTRAMUSCULAR | Status: AC
  Filled 2018-06-19: qty 1

## 2018-06-19 MED ORDER — FENTANYL CITRATE (PF) 100 MCG/2ML IJ SOLN
INTRAMUSCULAR | Status: DC | PRN
  Administered 2018-06-19: 50 ug via INTRAVENOUS

## 2018-06-19 MED ORDER — MIDAZOLAM HCL 2 MG/2ML IJ SOLN
INTRAMUSCULAR | Status: AC
  Filled 2018-06-19: qty 2

## 2018-06-19 MED ORDER — LIDOCAINE HCL (PF) 1 % IJ SOLN
INTRAMUSCULAR | Status: AC
  Filled 2018-06-19: qty 30

## 2018-06-19 MED ORDER — ATORVASTATIN CALCIUM 80 MG PO TABS: 80.0000 mg | ORAL_TABLET | Freq: Every day | ORAL | Status: DC

## 2018-06-19 SURGICAL SUPPLY — 11 items
CATH INFINITI 5FR ANG PIGTAIL (CATHETERS) ×2 IMPLANT
CATH OPTITORQUE TIG 4.0 5F (CATHETERS) ×2 IMPLANT
DEVICE RAD COMP TR BAND LRG (VASCULAR PRODUCTS) ×2 IMPLANT
GLIDESHEATH SLEND SS 6F .021 (SHEATH) ×2 IMPLANT
GUIDEWIRE INQWIRE 1.5J.035X260 (WIRE) ×1 IMPLANT
INQWIRE 1.5J .035X260CM (WIRE) ×2
KIT HEART LEFT (KITS) ×2 IMPLANT
PACK CARDIAC CATHETERIZATION (CUSTOM PROCEDURE TRAY) ×2 IMPLANT
SYR MEDRAD MARK 7 150ML (SYRINGE) ×2 IMPLANT
TRANSDUCER W/STOPCOCK (MISCELLANEOUS) ×2 IMPLANT
TUBING CIL FLEX 10 FLL-RA (TUBING) ×2 IMPLANT

## 2018-06-19 NOTE — Progress Notes (Signed)
Jennifer O Neal, Rn informed of CBG results  

## 2018-06-19 NOTE — Telephone Encounter (Signed)
Patient called to inform us that his blood sugar has been: 211 and 270 on 06/17/2018  333 on 06/18/2018  358 today.  Please follow up.

## 2018-06-19 NOTE — Discharge Instructions (Signed)
Radial Site Care ° °This sheet gives you information about how to care for yourself after your procedure. Your health care provider may also give you more specific instructions. If you have problems or questions, contact your health care provider. °What can I expect after the procedure? °After the procedure, it is common to have: °· Bruising and tenderness at the catheter insertion area. °Follow these instructions at home: °Medicines °· Take over-the-counter and prescription medicines only as told by your health care provider. °Insertion site care °· Follow instructions from your health care provider about how to take care of your insertion site. Make sure you: °? Wash your hands with soap and water before you change your bandage (dressing). If soap and water are not available, use hand sanitizer. °? Change your dressing as told by your health care provider. °? Leave stitches (sutures), skin glue, or adhesive strips in place. These skin closures may need to stay in place for 2 weeks or longer. If adhesive strip edges start to loosen and curl up, you may trim the loose edges. Do not remove adhesive strips completely unless your health care provider tells you to do that. °· Check your insertion site every day for signs of infection. Check for: °? Redness, swelling, or pain. °? Fluid or blood. °? Pus or a bad smell. °? Warmth. °· Do not take baths, swim, or use a hot tub until your health care provider approves. °· You may shower 24-48 hours after the procedure, or as directed by your health care provider. °? Remove the dressing and gently wash the site with plain soap and water. °? Pat the area dry with a clean towel. °? Do not rub the site. That could cause bleeding. °· Do not apply powder or lotion to the site. °Activity ° °· For 24 hours after the procedure, or as directed by your health care provider: °? Do not flex or bend the affected arm. °? Do not push or pull heavy objects with the affected arm. °? Do not  drive yourself home from the hospital or clinic. You may drive 24 hours after the procedure unless your health care provider tells you not to. °? Do not operate machinery or power tools. °· Do not lift anything that is heavier than 10 lb (4.5 kg), or the limit that you are told, until your health care provider says that it is safe. °· Ask your health care provider when it is okay to: °? Return to work or school. °? Resume usual physical activities or sports. °? Resume sexual activity. °General instructions °· If the catheter site starts to bleed, raise your arm and put firm pressure on the site. If the bleeding does not stop, get help right away. This is a medical emergency. °· If you went home on the same day as your procedure, a responsible adult should be with you for the first 24 hours after you arrive home. °· Keep all follow-up visits as told by your health care provider. This is important. °Contact a health care provider if: °· You have a fever. °· You have redness, swelling, or yellow drainage around your insertion site. °Get help right away if: °· You have unusual pain at the radial site. °· The catheter insertion area swells very fast. °· The insertion area is bleeding, and the bleeding does not stop when you hold steady pressure on the area. °· Your arm or hand becomes pale, cool, tingly, or numb. °These symptoms may represent a serious problem   that is an emergency. Do not wait to see if the symptoms will go away. Get medical help right away. Call your local emergency services (911 in the [place] Do not drive yourself to the hospital. °Summary °· After the procedure, it is common to have bruising and tenderness at the site. °· Follow instructions from your health care provider about how to take care of your radial site wound. Check the wound every day for signs of infection. °· Do not lift anything that is heavier than 10 lb (4.5 kg), or the limit that you are told, until your health care provider says  that it is safe. °This information is not intended to replace advice given to you by your health care provider. Make sure you discuss any questions you have with your health care provider. °Document Released: 04/23/2010 Document Revised: 04/26/2017 Document Reviewed: 04/26/2017 °Elsevier Interactive Patient Education © 2019 [employer] ° °

## 2018-06-19 NOTE — Telephone Encounter (Signed)
Patient notified of new insulin dose & recommendations. Expressed understanding. Repeated medication instructions back correctly.

## 2018-06-19 NOTE — Interval H&P Note (Signed)
Cath Lab Visit (complete for each Cath Lab visit)  Clinical Evaluation Leading to the Procedure:   ACS: No.  Non-ACS:    Anginal Classification: CCS III  Anti-ischemic medical therapy: Minimal Therapy (1 class of medications)  Non-Invasive Test Results: High-risk stress test findings: cardiac mortality >3%/year  Prior CABG: No previous CABG      History and Physical Interval Note:  06/19/2018 7:36 AM  Dylan Anderson  has presented today for surgery, with the diagnosis of abnormal cardiac CT.  The various methods of treatment have been discussed with the patient and family. After consideration of risks, benefits and other options for treatment, the patient has consented to  Procedure(s): LEFT HEART CATH AND CORONARY ANGIOGRAPHY (N/A) as a surgical intervention.  The patient's history has been reviewed, patient examined, no change in status, stable for surgery.  I have reviewed the patient's chart and labs.  Questions were answered to the patient's satisfaction.     Dylan Anderson

## 2018-06-19 NOTE — Telephone Encounter (Signed)
Advised patient to increase Lantus from once a day to twice a day 20 units.  Continue to monitor blood sugar as he is and notify me if he has any low blood sugars and where his blood sugar is below 80.  Continue glipizide and metformin as prescribed.

## 2018-06-20 ENCOUNTER — Ambulatory Visit: Payer: Self-pay | Admitting: Cardiology

## 2018-06-25 ENCOUNTER — Telehealth: Payer: Self-pay | Admitting: Family Medicine

## 2018-06-25 NOTE — Telephone Encounter (Signed)
1) Medication(s) Requested (by name): losartan (COZAAR) 50 MG tablet [[id number]]   Insulin Pen Needle (BD ULTRA-FINE PEN NEEDLES) 29G X 12.7MM MISC [[id number]]   Insulin Glargine (LANTUS) 100 UNIT/ML Solostar Pen [[id number]]  glipiZIDE (GLUCOTROL) 5 MG tablet [[id number]]  . gabapentin (NEURONTIN) 300 MG capsule [[id number]  atorvastatin (LIPITOR) 40 MG tablet [[id number]]    3) Special Requests:  Patient wants transfer to [facility]

## 2018-06-27 NOTE — Telephone Encounter (Signed)
Called [facility] to initiate transfer, they will fax transfers to [facility].

## 2018-06-30 MED FILL — !LANTUS SOLOSTAR 100UNITS/M: 100 | 30 days supply | Qty: 6 | Fill #1

## 2018-06-30 MED FILL — !VENTOLIN HFA INHALER: 108 (90 BAS | 25 days supply | Qty: 18 | Fill #0

## 2018-07-03 MED FILL — PANTOPRAZOLE SOD DR 20 MG T: 20 | 30 days supply | Qty: 30 | Fill #0

## 2018-07-03 MED FILL — glipiZIDE 5 MG TABS: 5 | 30 days supply | Qty: 30 | Fill #0

## 2018-07-03 MED FILL — GABAPENTIN 300 MG CAPSULE: 300 | 30 days supply | Qty: 90 | Fill #0

## 2018-07-03 MED FILL — TAMSULOSIN HCL 0.4 MG CAP: 0.4 | 30 days supply | Qty: 30 | Fill #0

## 2018-07-03 MED FILL — LOSARTAN POTASSIUM 50 MG TA: 50 | 30 days supply | Qty: 60 | Fill #0

## 2018-07-03 MED FILL — metFORMIN HCL 500 MG TABS: 500 | 30 days supply | Qty: 120 | Fill #0

## 2018-07-04 NOTE — Progress Notes (Signed)
LATE NOTE ENTRY  Dylan Anderson met inclusion and exclusion criteria.  The informed consent form, study requirements and expectations were reviewed with the subject and questions and concerns were addressed prior to the signing of the consent form.  The subject verbalized understanding of the trial requirements.  The subject agreed to participate in the CADFEM trial and signed the informed consent at 13:02 on 06/12/18.  The informed consent was obtained prior to performance of any protocol-specific procedures for the subject.  A copy of the signed informed consent was given to the subject and a copy was placed in the subject's medical record.   [place] Assistant

## 2018-07-05 ENCOUNTER — Telehealth: Payer: Self-pay | Admitting: Cardiology

## 2018-07-05 NOTE — Telephone Encounter (Signed)
Cardiac Questionnaire:    Since your last visit or hospitalization:    1. Have you been having new or worsening chest pain? no   2. Have you been having new or worsening shortness of breath? no 3. Have you been having new or worsening leg swelling, wt gain, or increase in abdominal girth (pants fitting more tightly)? no   4. Have you had any passing out spells? Dizzy and faint spells    *A YES to any of these questions would result in the appointment being kept. *If all the answers to these questions are NO, we should indicate that given the current situation regarding the worldwide coronarvirus pandemic, at the recommendation of the CDC, we are looking to limit gatherings in our waiting area, and thus will reschedule their appointment beyond four weeks from today.   _____________   COVID-19 Pre-Screening Questions:   Do you currently have a fever? no  Have you recently travelled on a cruise, internationally, or to [place], [place], [place], [place], [place], or [place], [place]) ? no  Have you been in contact with someone that is currently pending confirmation of Covid19 testing or has been confirmed to have the Covid19 virus? no  Are you currently experiencing fatigue or cough? Cough a little   YOUR CARDIOLOGY TEAM HAS ARRANGED FOR AN E-VISIT FOR YOUR APPOINTMENT - PLEASE REVIEW IMPORTANT INFORMATION BELOW SEVERAL DAYS PRIOR TO YOUR APPOINTMENT  Due to the recent COVID-19 pandemic, we are transitioning in-person office visits to tele-medicine visits in an effort to decrease unnecessary exposure to our patients and staff. Medicare and most insurances are covering these visits without a copay needed. We also encourage you to sign up for MyChart if you have not already done so. You will need a smartphone if possible. For patients that do not have this, we can still complete the visit using a regular telephone but do prefer a smartphone to enable video when possible. You may have a close family  member that lives with you that can help. If possible, we also ask that you have a blood pressure cuff and scale at home to measure your blood pressure, heart rate and weight prior to your scheduled appointment. Patients with clinical needs that need an in-person evaluation and testing will still be able to come to the office if absolutely necessary. If you have any questions, feel free to call our office.  CONSENT FOR TELE-HEALTH VISIT - PLEASE REVIEW  I hereby voluntarily request, consent and authorize [facility] and its employed or contracted physicians, physician assistants, nurse practitioners or other licensed health care professionals (the Practitioner), to provide me with telemedicine health care services (the Services") as deemed necessary by the treating Practitioner. I acknowledge and consent to receive the Services by the Practitioner via telemedicine. I understand that the telemedicine visit will involve communicating with the Practitioner through live audiovisual communication technology and the disclosure of certain medical information by electronic transmission. I acknowledge that I have been given the opportunity to request an in-person assessment or other available alternative prior to the telemedicine visit and am voluntarily participating in the telemedicine visit.  I understand that I have the right to withhold or withdraw my consent to the use of telemedicine in the course of my care at any time, without affecting my right to future care or treatment, and that the Practitioner or I may terminate the telemedicine visit at any time. I understand that I have the right to inspect all information obtained and/or  recorded in the course of the telemedicine visit and may receive copies of available information for a reasonable fee.  I understand that some of the potential risks of receiving the Services via telemedicine include:  Delay or interruption in medical evaluation due to  technological equipment failure or disruption; Information transmitted may not be sufficient (e.g. poor resolution of images) to allow for appropriate medical decision making by the Practitioner; and/or  In rare instances, security protocols could fail, causing a breach of personal health information.  Furthermore, I acknowledge that it is my responsibility to provide information about my medical history, conditions and care that is complete and accurate to the best of my ability. I acknowledge that Practitioner's advice, recommendations, and/or decision may be based on factors not within their control, such as incomplete or inaccurate data provided by me or distortions of diagnostic images or specimens that may result from electronic transmissions. I understand that the practice of medicine is not an exact science and that Practitioner makes no warranties or guarantees regarding treatment outcomes. I acknowledge that I will receive a copy of this consent concurrently upon execution via email to the email address I last provided but may also request a printed copy by calling the office of [facility].    I understand that my insurance will be billed for this visit.   I have read or had this consent read to me. I understand the contents of this consent, which adequately explains the benefits and risks of the Services being provided via telemedicine.  I have been provided ample opportunity to ask questions regarding this consent and the Services and have had my questions answered to my satisfaction. I give my informed consent for the services to be provided through the use of telemedicine in my medical care  By participating in this telemedicine visit I agree to the above.   Patient acknowledges information and agrees to consent for televisit 07/05/2018 pp

## 2018-07-06 ENCOUNTER — Telehealth: Payer: Self-pay

## 2018-07-06 NOTE — Telephone Encounter (Signed)
Called patient to do their pre-visit COVID screening.  Have you recently traveled internationally([place], [place], [place], [place], [place]) or within the [place] to a hotspot area([place], [place], [place], [place], [place])? no  Are you currently experiencing any of the following: fever, cough, SHOB, fatigue? Denies fever, fatigue, body aches. Does have a slight cough & SHOB  Have you been in contact with anyone who has recently travelled? no  Have you been in contact with anyone who is experiencing fever, cough, SHOB, fatigue or been diagnosed with COVID  or works in or has recently visited a SNF? no

## 2018-07-08 DIAGNOSIS — I2510 Atherosclerotic heart disease of native coronary artery without angina pectoris: Secondary | ICD-10-CM | POA: Insufficient documentation

## 2018-07-09 ENCOUNTER — Other Ambulatory Visit: Payer: Self-pay

## 2018-07-09 ENCOUNTER — Telehealth (INDEPENDENT_AMBULATORY_CARE_PROVIDER_SITE_OTHER): Payer: Self-pay | Admitting: Cardiology

## 2018-07-09 ENCOUNTER — Ambulatory Visit (INDEPENDENT_AMBULATORY_CARE_PROVIDER_SITE_OTHER): Payer: Self-pay | Admitting: Family Medicine

## 2018-07-09 ENCOUNTER — Encounter: Payer: Self-pay | Admitting: Cardiology

## 2018-07-09 ENCOUNTER — Encounter: Payer: Self-pay | Admitting: Family Medicine

## 2018-07-09 VITALS — BP 145/80 | HR 80 | Temp 97.8°F | Resp 17 | Wt 156.8 lb

## 2018-07-09 VITALS — BP 145/80 | HR 80 | Ht 68.0 in | Wt 156.0 lb

## 2018-07-09 DIAGNOSIS — I10 Essential (primary) hypertension: Secondary | ICD-10-CM

## 2018-07-09 DIAGNOSIS — E1159 Type 2 diabetes mellitus with other circulatory complications: Secondary | ICD-10-CM

## 2018-07-09 DIAGNOSIS — E119 Type 2 diabetes mellitus without complications: Secondary | ICD-10-CM

## 2018-07-09 DIAGNOSIS — R0602 Shortness of breath: Secondary | ICD-10-CM

## 2018-07-09 DIAGNOSIS — I2510 Atherosclerotic heart disease of native coronary artery without angina pectoris: Secondary | ICD-10-CM

## 2018-07-09 DIAGNOSIS — F17200 Nicotine dependence, unspecified, uncomplicated: Secondary | ICD-10-CM

## 2018-07-09 DIAGNOSIS — Z23 Encounter for immunization: Secondary | ICD-10-CM

## 2018-07-09 DIAGNOSIS — Z794 Long term (current) use of insulin: Secondary | ICD-10-CM

## 2018-07-09 DIAGNOSIS — E7800 Pure hypercholesterolemia, unspecified: Secondary | ICD-10-CM

## 2018-07-09 DIAGNOSIS — E1165 Type 2 diabetes mellitus with hyperglycemia: Secondary | ICD-10-CM

## 2018-07-09 DIAGNOSIS — F17210 Nicotine dependence, cigarettes, uncomplicated: Secondary | ICD-10-CM

## 2018-07-09 MED ORDER — GLIPIZIDE 5 MG PO TABS: 5.0000 mg | ORAL_TABLET | Freq: Every day | ORAL | 3 refills | Status: DC

## 2018-07-09 MED ORDER — AMLODIPINE BESYLATE 5 MG PO TABS: 5.0000 mg | ORAL_TABLET | Freq: Every day | ORAL | 3 refills | Status: DC

## 2018-07-09 MED ORDER — RANOLAZINE ER 500 MG PO TB12: 500.0000 mg | ORAL_TABLET | Freq: Two times a day (BID) | ORAL | 1 refills | Status: DC

## 2018-07-09 MED FILL — RANEXA ER 500 MG TABLET: 500 | 30 days supply | Qty: 60 | Fill #0

## 2018-07-09 MED FILL — AMLODIPINE BESYLATE 5 MG TA: 5 | 30 days supply | Qty: 30 | Fill #0

## 2018-07-09 NOTE — Telephone Encounter (Signed)
Patient suffers from COPD and has a chronic cough and SOB at his normal baseline

## 2018-07-09 NOTE — Addendum Note (Signed)
Addended by: Crist Fat on: 07/09/2018 03:17 PM   Modules accepted: Orders

## 2018-07-09 NOTE — Progress Notes (Signed)
Patient ID: Dylan Anderson, male    DOB: June 28, 1960, 58 y.o.   MRN: [id number]  PCP: Bing NeighborsHarris, Copeland Lapier S, FNP  Chief Complaint  Patient presents with  . Diabetes  . Hypertension    Subjective:  HPI Dylan IdaBill Anderson is a 58 y.o. male presents for evaluation of diabetes and hypertension.  Syre medical history consists of has Diabetes mellitus type II-insulin dependent (HCC); Hypertension; Tobacco abuse; Hyperlipidemia; Peripheral artery disease (HCC); Chronic cough; Acute gout involving toe of left foot; Claudication (HCC); Chest pain in adult; PVC's (premature ventricular contractions); Abnormal cardiac CT angiography; and Mild CAD.  Diabetes Dylan Anderson was last seen in office on 06/11/18 for diabetes management. Despite efforts to control diabetes with oral medication, long-acting insulin was added to his regimen, 20 units BID. Last A1C 8.9 (05/11/2018). Home readings mostly 150-170's. Lowest BS  66 and highest BS 500. Unable to identify food or stressor that caused reading of 500. He is checking his blood sugar at least 2-3 times per day. He consistently two meals per day, breakfast and dinner. Lunch is typically peanut butter crackers for light snack. Drinks water all day. Due to COVID crisis  patient has been recently sedentary.   Hypertension/ Dyspnea  Dylan Anderson is s/p left heart catheterization.  He was referred to cardiology for further work-up of on-going chest pain and was found to have an abnormal calcium score. Heart catheretization without intervention. He was found to have mild CAD with one 80% blockage of ramus intermediate vessel in which aggressive lipid treatment, currently prescribed statin, and achieving good blood pressure control was recommended by cardiology. Patient has significantly reduced smoking since our last visit. He plans to totally quit within the next coming days. He has not experienced any further chest pain, however continues to experience intermittent shortness of breath. Patient  suffers from underlying chronic bronchitis in which he is prescribed albuterol. He is using albuterol once daily and mostly upon awakening the morning. Social History   Socioeconomic History  . Marital status: Legally Separated    Spouse name: Not on file  . Number of children: Not on file  . Years of education: Not on file  . Highest education level: Not on file  Occupational History  . Occupation: unemployed  Social Needs  . Financial resource strain: Not on file  . Food insecurity:    Worry: Not on file    Inability: Not on file  . Transportation needs:    Medical: Not on file    Non-medical: Not on file  Tobacco Use  . Smoking status: Current Every Day Smoker    Packs/day: 0.50    Years: 17.00    Pack years: 8.50    Types: Cigarettes    Start date: 04/04/1978  . Smokeless tobacco: Never Used  . Tobacco comment: Max 1 ppd  Substance and Sexual Activity  . Alcohol use: Yes    Comment: 2-3 mixed drinks daily  . Drug use: No  . Sexual activity: Yes    Partners: Female  Lifestyle  . Physical activity:    Days per week: Not on file    Minutes per session: Not on file  . Stress: Not on file  Relationships  . Social connections:    Talks on phone: Not on file    Gets together: Not on file    Attends religious service: Not on file    Active member of club or organization: Not on file    Attends meetings of clubs or organizations: Not  on file    Relationship status: Not on file  . Intimate partner violence:    Fear of current or ex partner: Not on file    Emotionally abused: Not on file    Physically abused: Not on file    Forced sexual activity: Not on file  Other Topics Concern  . Not on file  Social History Narrative  . Not on file    Family History  Problem Relation Age of Onset  . Heart failure Mother   . Osteoarthritis Mother   . COPD Sister   . Stroke Sister   . Heart attack Maternal Aunt   . Heart attack Maternal Uncle    Review of Systems Pertinent  negatives listed in HPI Patient Active Problem List   Diagnosis Date Noted  . Mild CAD 07/08/2018  . Abnormal cardiac CT angiography   . PVC's (premature ventricular contractions) 05/28/2018  . Chest pain in adult 05/26/2018  . Claudication (HCC) 11/05/2017  . Acute gout involving toe of left foot 09/21/2017  . Chronic cough 07/13/2017  . Healthcare maintenance 07/12/2017  . Peripheral artery disease (HCC) 06/07/2017  . Hip pain 05/20/2017  . Gastroesophageal reflux disease 04/27/2017  . Hand pain 03/18/2017  . Insomnia 02/06/2017  . Hyperlipidemia 01/25/2017  . Erectile dysfunction 01/25/2017  . Low back pain 01/25/2017  . Diabetes mellitus type II, non insulin dependent (HCC) 07/10/2012  . Hypertension 07/10/2012  . Tobacco abuse 07/10/2012    No Known Allergies  Prior to Admission medications   Medication Sig Start Date End Date Taking? Authorizing Provider  aspirin EC 81 MG tablet Take 1 tablet (81 mg total) by mouth daily. 05/28/18   Baldo Daub, MD  atorvastatin (LIPITOR) 40 MG tablet Take 2 tablets (80 mg total) by mouth daily at 6 PM. Patient taking differently: Take 40 mg by mouth daily at 6 PM.  02/13/18 05/28/20  Bing Neighbors, FNP  gabapentin (NEURONTIN) 300 MG capsule Take 1 capsule (300 mg total) by mouth 3 (three) times daily. 05/11/18   Bing Neighbors, FNP  glipiZIDE (GLUCOTROL) 5 MG tablet Take 1 tablet (5 mg total) by mouth daily before breakfast. Patient taking differently: Take 5 mg by mouth 2 (two) times daily.  06/11/18   Bing Neighbors, FNP  Insulin Glargine (LANTUS) 100 UNIT/ML Solostar Pen Inject 20 Units into the skin 2 (two) times daily. 06/19/18   Bing Neighbors, FNP  Insulin Pen Needle (BD ULTRA-FINE PEN NEEDLES) 29G X 12.7MM MISC ICD10 E11.9 for use with insulin administration 06/11/18   Bing Neighbors, FNP  losartan (COZAAR) 50 MG tablet Take 2 tablets (100 mg total) by mouth daily. Patient taking differently: Take 50 mg by mouth 2  (two) times daily. Morning & lunch 05/14/18   Bing Neighbors, FNP  metFORMIN (GLUCOPHAGE) 500 MG tablet Take 2 tablets (1,000 mg total) by mouth 2 (two) times daily with a meal. 08/03/17   Hensel, Santiago Bumpers, MD  metoprolol tartrate (LOPRESSOR) 50 MG tablet Take 2 tablets two hours prior to procedure. Patient not taking: Reported on 06/11/2018 05/28/18   Baldo Daub, MD  naproxen sodium (ALEVE) 220 MG tablet Take 440 mg by mouth 2 (two) times daily as needed (pain).    [provider]  pantoprazole (PROTONIX) 20 MG tablet Take 1 tablet (20 mg total) by mouth daily. 05/11/18   Bing Neighbors, FNP  tamsulosin (FLOMAX) 0.4 MG CAPS capsule Take 1 capsule (0.4 mg total) by mouth  daily. 02/08/18   Bing Neighbors, FNP    Past Medical, Surgical Family and Social History reviewed and updated.    Objective:  There were no vitals filed for this visit.  Wt Readings from Last 3 Encounters:  06/19/18 152 lb (68.9 kg)  06/11/18 151 lb 9.6 oz (68.8 kg)  06/03/18 152 lb (68.9 kg)   Physical Exam General appearance: alert, well developed, well nourished, cooperative and in no distress Head: Normocephalic, without obvious abnormality, atraumatic Respiratory: Respirations even and unlabored, normal respiratory rate Heart: rate and rhythm normal. No gallop or murmurs noted on exam  Extremities: No gross deformities Skin: Skin color, texture, turgor normal. No rashes seen  Psych: Appropriate mood and affect. Neurologic: Mental status: Alert, oriented to person, place, and time, thought content appropriate.  Lab Results  Component Value Date   POCGLU 279 (A) 06/11/2018   POCGLU 290 (A) 05/11/2018   POCGLU 158 (A) 01/13/2017    Lab Results  Component Value Date   HGBA1C 8.9 (H) 05/11/2018    Assessment & Plan:  1. Type 2 diabetes mellitus with other circulatory complication, with long-term current use of insulin (HCC), Repeating A1C. Patient had increased dose of glipizide twice  daily, therefore this is likely the cause of lower blood sugar readings. Recommend Glipizide 5 mg with breakfast and continue Lantus 20 units twice daily. Hold Lantus for blood sugar less than 125.  Repeating Hemoglobin A1c today. Continue metformin and glipizide.Continue efforts to quit smoking.    2. Essential hypertension, not at goal, stable Goal reading given recent diagnosis of CAD, 120/80 or less -Continue Losartan 100 mg -Adding amlodipine (NORVASC) 5 MG tablet; Take 1 tablet (5 mg total) by mouth daily.  Dispense: 90 tablet; Refill: 3  3. Shortness of breath -Discussed appropriate use of albuterol and the need to use during acute episodes of shortness of breath.  4. Tobacco use disorder Congratulations on decreasing smoking! Continue efforts to completely quit. This will greatly improve overall health and blood pressure.  5. Need for prophylactic vaccination against Streptococcus pneumoniae (pneumococcus) - Pneumococcal polysaccharide vaccine 23-valent greater than or equal to 2yo subcutaneous/IM    RTC: 3 months chronic condition follow-up.    -The patient was given clear instructions to go to ER or return to medical center if symptoms do not improve, worsen or new problems develop. The patient verbalized understanding.    Joaquin Courts, FNP [facility] [address] [place] [address] [phone]: [phone]

## 2018-07-09 NOTE — Progress Notes (Signed)
Virtual Visit via Video Note   This visit type was conducted due to national recommendations for restrictions regarding the COVID-19 Pandemic (e.g. social distancing) in an effort to limit this patient's exposure and mitigate transmission in our community.  Due to his co-morbid illnesses, this patient is at least at moderate risk for complications without adequate follow up.  This format is felt to be most appropriate for this patient at this time.  All issues noted in this document were discussed and addressed.  A limited physical exam was performed with this format.  Please refer to the patient's chart for his consent to telehealth for [facility].   Evaluation Performed:  Follow-up visit  Date:  07/09/2018   ID:  Dylan Anderson, DOB [birth year], MRN [id number]  Patient Location: Home  Provider Location: Home  PCP:  Bing NeighborsHarris, Kimberly S, FNP  Cardiologist:  No primary care provider on file. Dr Dulce SellarMunley Electrophysiologist:  None   Chief Complaint:  FU after left heart cath  History of Present Illness:    Dylan Anderson is a 58 y.o. male who presents via audio/video conferencing for a telehealth visit today.    Dylan Anderson is a 58 y.o. male with a hx of PAD left common iliac stent [facility] several years ago referred for chest pain referred by Dr Tiburcio PeaHarris  He has a pattern of exertional angina New York Heart Association class II cardiac CTA suggested severe diffuse multivessel CAD with extensive coronary artery calcification, left heart catheterization showed mild nonobstructive CAD.  He continues to have angina when he walks outdoors a longer distance and its limiting.  In addition his current medical treatment will run out of ranolazine I do not think we need to check an EKG in the office in the next week I will see back in the office in 3 months and if beneficial uptitrate.  He is also short of breath with physical activity in his legs fatigue he has no edema orthopnea palpitations  syncope or TIA he has had no fever cough or sputum production The patient does not have symptoms concerning for COVID-19 infection (fever, chills, cough, or new shortness of breath).    Past Medical History:  Diagnosis Date  . Acute gout involving toe of left foot 09/21/2017  . Arthritis   . Back pain   . Chest pain in adult 05/26/2018  . Chronic cough 07/13/2017  . Claudication (HCC)   . Diabetes mellitus type II, non insulin dependent (HCC) 07/10/2012  . Erectile dysfunction   . Gastroesophageal reflux disease 04/27/2017  . Hand pain 03/18/2017  . Healthcare maintenance 07/12/2017  . Hip pain 05/20/2017  . Hyperlipidemia 01/25/2017  . Hypertension 07/10/2012  . Insomnia 02/06/2017  . Low back pain 01/25/2017  . Peripheral artery disease (HCC) 06/07/2017  . PVD (peripheral vascular disease) (HCC)   . Tobacco abuse 07/10/2012  . Type 2 diabetes mellitus (HCC)    Past Surgical History:  Procedure Laterality Date  . ILIAC ARTERY STENT    . LEFT HEART CATH AND CORONARY ANGIOGRAPHY N/A 06/19/2018   Procedure: LEFT HEART CATH AND CORONARY ANGIOGRAPHY;  Surgeon: Lennette BihariKelly, Thomas A, MD;  Location: [facility] INV[facility]IVE CV LAB;  Service: Cardiovascular;  Laterality: N/A;  . stent Left    left common and left internal illiac stent     Current Meds  Medication Sig  . amLODipine (NORVASC) 5 MG tablet Take 1 tablet (5 mg total) by mouth daily.  [name]. aspirin EC 81 MG tablet  Take 1 tablet (81 mg total) by mouth daily.  [name] atorvastatin (LIPITOR) 40 MG tablet Take 2 tablets (80 mg total) by mouth daily at 6 PM.  . gabapentin (NEURONTIN) 300 MG capsule Take 1 capsule (300 mg total) by mouth 3 (three) times daily.  [name] glipiZIDE (GLUCOTROL) 5 MG tablet Take 1 tablet (5 mg total) by mouth daily before breakfast.  . Insulin Glargine (LANTUS) 100 UNIT/ML Solostar Pen Inject 20 Units into the skin 2 (two) times daily.  . Insulin Pen Needle (BD ULTRA-FINE PEN NEEDLES) 29G X 12.7MM MISC ICD10 E11.9 for use with insulin  administration  . losartan (COZAAR) 50 MG tablet Take 50 mg by mouth 2 (two) times daily.  . metFORMIN (GLUCOPHAGE) 500 MG tablet Take 2 tablets (1,000 mg total) by mouth 2 (two) times daily with a meal.  . naproxen sodium (ALEVE) 220 MG tablet Take 440 mg by mouth 2 (two) times daily as needed (pain).  . pantoprazole (PROTONIX) 20 MG tablet Take 1 tablet (20 mg total) by mouth daily.  . tamsulosin (FLOMAX) 0.4 MG CAPS capsule Take 1 capsule (0.4 mg total) by mouth daily.     Allergies:   Patient has no known allergies.   Social History   Tobacco Use  . Smoking status: Current Every Day Smoker    Packs/day: 0.50    Years: 17.00    Pack years: 8.50    Types: Cigarettes    Start date: 04/04/1978  . Smokeless tobacco: Never Used  . Tobacco comment: Max 1 ppd  Substance Use Topics  . Alcohol use: Yes    Comment: 2-3 mixed drinks 3 times per week  . Drug use: No     Family Hx: The patient's family history includes COPD in his sister; Heart attack in his maternal aunt and maternal uncle; Heart failure in his mother; Osteoarthritis in his mother; Stroke in his sister.  ROS:   Please see the history of present illness.     All other systems reviewed and are negative.   Prior CV studies:   The following studies were reviewed today:  Cardiac cath  05/21/18 Significant coronary calcification involving the left main, LAD, intermediate, and circumflex vessels.  There is smooth mild 10% narrowing of the left main.  The LAD has mild proximal irregularity of 10 to 15% followed by 30 to 35% proximal stenosis before diagonal vessel;  the ramus intermediate vessel is a very small caliber vessel which has 80% ostial proximal narrowing; the left circumflex vessel is a codominant vessel with calcification proximally and in the major marginal branch.  There is 25% proximal narrowing and 30% narrowing in the proximal circumflex marginal branch with an additional 20% narrowing in a inferior sub-branch;  and RCA with mild haziness proximally with 10% narrowing.   Hyperdynamic LV function with an EF of at least 65% without focal segmental wall motion abnormalities.  LVEDP is 7 to 10 mm hg.   RECOMMENDATION: Medical therapy for optimal blood pressure control with target blood pressure less than 120/80.  Consider adding amlodipine or beta-blocker to medical regimen.  Aggressive lipid-lowering therapy with target LDL less than 70 in an attempt to induce plaque regression.  Aspirin therapy for antiplatelet benefit.   Labs/Other Tests and Data Reviewed:    EKG:  An ECG dated 06/04/18 was personally reviewed today and demonstrated:  SRTH non specific T waves  Recent Labs: 02/08/2018: ALT 13 06/03/2018: B Natriuretic Peptide 16.1; Hemoglobin 12.7; Platelets 290 06/15/2018: BUN 21; Creatinine,  Ser 1.28; Potassium 4.2; Sodium 138   Recent Lipid Panel Lab Results  Component Value Date/Time   CHOL 232 (H) 02/08/2018 09:32 AM   TRIG 155 (H) 02/08/2018 09:32 AM   HDL 45 02/08/2018 09:32 AM   CHOLHDL 5.2 (H) 02/08/2018 09:32 AM   CHOLHDL 5.7 07/10/2012 01:01 PM   LDLCALC 156 (H) 02/08/2018 09:32 AM    Wt Readings from Last 3 Encounters:  07/09/18 156 lb (70.8 kg)  07/09/18 156 lb 12.8 oz (71.1 kg)  06/19/18 152 lb (68.9 kg)     Objective:    Vital Signs:  BP (!) 145/80   Pulse 80   Ht  (1.727 m)   Wt 156 lb (70.8 kg)   BMI 23.72 kg/m    Well nourished, well developed male in n acute distress. During the video visit he was ambulating outdoors without distress  ASSESSMENT & PLAN:    1. Coronary artery disease stable New York Heart Association class II he has mild nonobstructive CAD continuing symptoms and I will add ranolazine to his current treatment of long-acting calcium channel blocker aspirin and high intensity statin.  Reassess in the office 3 months. 2. Hypertension stable blood pressure typically is less than 140 continue current treatment including ARB 3. Hyperlipidemia  stable continue his high intensity statin and recheck lipids liver at next visit 4. Stable diabetes managed by his PCP  COVID-19 Education: The signs and symptoms of COVID-19 were discussed with the patient and how to seek care for testing (follow up with PCP or arrange E-visit).  The importance of social distancing was discussed today.  Time:   Today, I have spent 30 minutes with the patient with telehealth technology discussing the above problems.     Medication Adjustments/Labs and Tests Ordered: Current medicines are reviewed at length with the patient today.  Concerns regarding medicines are outlined above.  Tests Ordered: No orders of the defined types were placed in this encounter.  Medication Changes: No orders of the defined types were placed in this encounter.   Disposition:  Follow up July or August 2020  SignedNorman Herrlich, MD  07/09/2018 2:49 PM    [facility]

## 2018-07-09 NOTE — Patient Instructions (Signed)
Continue Losartan. Adding Amlodipine. Reduce Glipizide 5 mg with breakfast.    Hypertension Hypertension, commonly called high blood pressure, is when the force of blood pumping through the arteries is too strong. The arteries are the blood vessels that carry blood from the heart throughout the body. Hypertension forces the heart to work harder to pump blood and may cause arteries to become narrow or stiff. Having untreated or uncontrolled hypertension can cause heart attacks, strokes, kidney disease, and other problems. A blood pressure reading consists of a higher number over a lower number. Ideally, your blood pressure should be below 120/80. The first ("top") number is called the systolic pressure. It is a measure of the pressure in your arteries as your heart beats. The second ("bottom") number is called the diastolic pressure. It is a measure of the pressure in your arteries as the heart relaxes. What are the causes? The cause of this condition is not known. What increases the risk? Some risk factors for high blood pressure are under your control. Others are not. Factors you can change  Smoking.  Having type 2 diabetes mellitus, high cholesterol, or both.  Not getting enough exercise or physical activity.  Being overweight.  Having too much fat, sugar, calories, or salt (sodium) in your diet.  Drinking too much alcohol. Factors that are difficult or impossible to change  Having chronic kidney disease.  Having a family history of high blood pressure.  Age. Risk increases with age.  Race. You may be at higher risk if you are African-American.  Gender. Men are at higher risk than women before age 73. After age 64, women are at higher risk than men.  Having obstructive sleep apnea.  Stress. What are the signs or symptoms? Extremely high blood pressure (hypertensive crisis) may cause:  Headache.  Anxiety.  Shortness of breath.  Nosebleed.  Nausea and  vomiting.  Severe chest pain.  Jerky movements you cannot control (seizures). How is this diagnosed? This condition is diagnosed by measuring your blood pressure while you are seated, with your arm resting on a surface. The cuff of the blood pressure monitor will be placed directly against the skin of your upper arm at the level of your heart. It should be measured at least twice using the same arm. Certain conditions can cause a difference in blood pressure between your right and left arms. Certain factors can cause blood pressure readings to be lower or higher than normal (elevated) for a short period of time:  When your blood pressure is higher when you are in a health care provider's office than when you are at home, this is called white coat hypertension. Most people with this condition do not need medicines.  When your blood pressure is higher at home than when you are in a health care provider's office, this is called masked hypertension. Most people with this condition may need medicines to control blood pressure. If you have a high blood pressure reading during one visit or you have normal blood pressure with other risk factors:  You may be asked to return on a different day to have your blood pressure checked again.  You may be asked to monitor your blood pressure at home for 1 week or longer. If you are diagnosed with hypertension, you may have other blood or imaging tests to help your health care provider understand your overall risk for other conditions. How is this treated? This condition is treated by making healthy lifestyle changes, such as eating  healthy foods, exercising more, and reducing your alcohol intake. Your health care provider may prescribe medicine if lifestyle changes are not enough to get your blood pressure under control, and if:  Your systolic blood pressure is above 130.  Your diastolic blood pressure is above 80. Your personal target blood pressure may vary  depending on your medical conditions, your age, and other factors. Follow these instructions at home: Eating and drinking   Eat a diet that is high in fiber and potassium, and low in sodium, added sugar, and fat. An example eating plan is called the DASH (Dietary Approaches to Stop Hypertension) diet. To eat this way: ? Eat plenty of fresh fruits and vegetables. Try to fill half of your plate at each meal with fruits and vegetables. ? Eat whole grains, such as whole wheat pasta, brown rice, or whole grain bread. Fill about one quarter of your plate with whole grains. ? Eat or drink low-fat dairy products, such as skim milk or low-fat yogurt. ? Avoid fatty cuts of meat, processed or cured meats, and poultry with skin. Fill about one quarter of your plate with lean proteins, such as fish, chicken without skin, beans, eggs, and tofu. ? Avoid premade and processed foods. These tend to be higher in sodium, added sugar, and fat.  Reduce your daily sodium intake. Most people with hypertension should eat less than 1,500 mg of sodium a day.  Limit alcohol intake to no more than 1 drink a day for nonpregnant women and 2 drinks a day for men. One drink equals 12 oz of beer, 5 oz of wine, or 1 oz of hard liquor. Lifestyle   Work with your health care provider to maintain a healthy body weight or to lose weight. Ask what an ideal weight is for you.  Get at least 30 minutes of exercise that causes your heart to beat faster (aerobic exercise) most days of the week. Activities may include walking, swimming, or biking.  Include exercise to strengthen your muscles (resistance exercise), such as pilates or lifting weights, as part of your weekly exercise routine. Try to do these types of exercises for 30 minutes at least 3 days a week.  Do not use any products that contain nicotine or tobacco, such as cigarettes and e-cigarettes. If you need help quitting, ask your health care provider.  Monitor your blood  pressure at home as told by your health care provider.  Keep all follow-up visits as told by your health care provider. This is important. Medicines  Take over-the-counter and prescription medicines only as told by your health care provider. Follow directions carefully. Blood pressure medicines must be taken as prescribed.  Do not skip doses of blood pressure medicine. Doing this puts you at risk for problems and can make the medicine less effective.  Ask your health care provider about side effects or reactions to medicines that you should watch for. Contact a health care provider if:  You think you are having a reaction to a medicine you are taking.  You have headaches that keep coming back (recurring).  You feel dizzy.  You have swelling in your ankles.  You have trouble with your vision. Get help right away if:  You develop a severe headache or confusion.  You have unusual weakness or numbness.  You feel faint.  You have severe pain in your chest or abdomen.  You vomit repeatedly.  You have trouble breathing. Summary  Hypertension is when the force of blood pumping  through your arteries is too strong. If this condition is not controlled, it may put you at risk for serious complications.  Your personal target blood pressure may vary depending on your medical conditions, your age, and other factors. For most people, a normal blood pressure is less than 120/80.  Hypertension is treated with lifestyle changes, medicines, or a combination of both. Lifestyle changes include weight loss, eating a healthy, low-sodium diet, exercising more, and limiting alcohol. This information is not intended to replace advice given to you by your health care provider. Make sure you discuss any questions you have with your health care provider. Document Released: 03/21/2005 Document Revised: 02/17/2016 Document Reviewed: 02/17/2016 Elsevier Interactive Patient Education  2019 [employer].

## 2018-07-09 NOTE — Patient Instructions (Addendum)
Medication Instructions:  Your physician has recommended you make the following change in your medication:   START ranolazine (ranexa) 500 mg: Take 1 tablet twice daily   If you need a refill on your cardiac medications before your next appointment, please call your pharmacy.   Lab work: None  If you have labs (blood work) drawn today and your tests are completely normal, you will receive your results only by: [name] MyChart Message (if you have MyChart) OR . A paper copy in the mail If you have any lab test that is abnormal or we need to change your treatment, we will call you to review the results.  Testing/Procedures: None  Follow-Up: At [facility], you and your health needs are our priority.  As part of our continuing mission to provide you with exceptional heart care, we have created designated Provider Care Teams.  These Care Teams include your primary Cardiologist (physician) and Advanced Practice Providers (APPs -  Physician Assistants and Nurse Practitioners) who all work together to provide you with the care you need, when you need it. You will need a follow up appointment in 3 months: Thursday, 10/04/2018, at 2:40 pm in the [facility] office.      Ranolazine tablets, extended release What is this medicine? RANOLAZINE (ra NOE la zeen) is a heart medicine. It is used to treat chronic chest pain (angina). This medicine must be taken regularly. It will not relieve an acute episode of chest pain. This medicine may be used for other purposes; ask your health care provider or pharmacist if you have questions. COMMON BRAND NAME(S): Ranexa What should I tell my health care provider before I take this medicine? They need to know if you have any of these conditions: -heart disease -irregular heartbeat -kidney disease -liver disease -low levels of potassium or magnesium in the blood -an unusual or allergic reaction to ranolazine, other medicines, foods, dyes, or  preservatives -pregnant or trying to get pregnant -breast-feeding How should I use this medicine? Take this medicine by mouth with a glass of water. Follow the directions on the prescription label. Do not cut, crush, or chew this medicine. Take with or without food. Do not take this medication with grapefruit juice. Take your doses at regular intervals. Do not take your medicine more often then directed. Talk to your pediatrician regarding the use of this medicine in children. Special care may be needed. Overdosage: If you think you have taken too much of this medicine contact a poison control center or emergency room at once. NOTE: This medicine is only for you. Do not share this medicine with others. What if I miss a dose? If you miss a dose, take it as soon as you can. If it is almost time for your next dose, take only that dose. Do not take double or extra doses. What may interact with this medicine? Do not take this medicine with any of the following medications: -antivirals for HIV or AIDS -cerivastatin -certain antibiotics like chloramphenicol, clarithromycin, dalfopristin; quinupristin, isoniazid, rifabutin, rifampin, rifapentine -certain medicines used for cancer like imatinib, nilotinib -certain medicines for fungal infections like fluconazole, itraconazole, ketoconazole, posaconazole, voriconazole -certain medicines for irregular heart beat like dofetilide, dronedarone -certain medicines for seizures like carbamazepine, fosphenytoin, oxcarbazepine, phenobarbital, phenytoin -cisapride -conivaptan -cyclosporine -grapefruit or grapefruit juice -lumacaftor; ivacaftor -nefazodone -pimozide -quinacrine -St John's wort -thioridazine -ziprasidone This medicine may also interact with the following medications: -alfuzosin -certain medicines for depression, anxiety, or psychotic disturbances like bupropion, citalopram, fluoxetine, fluphenazine,  paroxetine, perphenazine, risperidone,  sertraline, trifluoperazine -certain medicines for cholesterol like atorvastatin, lovastatin, simvastatin -certain medicines for stomach problems like octreotide, palonosetron, prochlorperazine -eplerenone -ergot alkaloids like dihydroergotamine, ergonovine, ergotamine, methylergonovine -metformin -nicardipine -other medicines that prolong the QT interval (cause an abnormal heart rhythm) -sirolimus -tacrolimus This list may not describe all possible interactions. Give your health care provider a list of all the medicines, herbs, non-prescription drugs, or dietary supplements you use. Also tell them if you smoke, drink alcohol, or use illegal drugs. Some items may interact with your medicine. What should I watch for while using this medicine? Visit your doctor for regular check ups. Tell your doctor or healthcare professional if your symptoms do not start to get better or if they get worse. This medicine will not relieve an acute attack of angina or chest pain. This medicine can change your heart rhythm. Your health care provider may check your heart rhythm by ordering an electrocardiogram (ECG) while you are taking this medicine. You may get drowsy or dizzy. Do not drive, use machinery, or do anything that needs mental alertness until you know how this medicine affects you. Do not stand or sit up quickly, especially if you are an older patient. This reduces the risk of dizzy or fainting spells. Alcohol may interfere with the effect of this medicine. Avoid alcoholic drinks. If you are scheduled for any medical or dental procedure, tell your healthcare provider that you are taking this medicine. This medicine can interact with other medicines used during surgery. What side effects may I notice from receiving this medicine? Side effects that you should report to your doctor or health care professional as soon as possible: -allergic reactions like skin rash, itching or hives, swelling of the face,  lips, or tongue -breathing problems -changes in vision -fast, irregular or pounding heartbeat -feeling faint or lightheaded, falls -low or high blood pressure -numbness or tingling feelings -ringing in the ears -tremor or shakiness -slow heartbeat (fewer than 50 beats per minute) -swelling of the legs or feet Side effects that usually do not require medical attention (report to your doctor or health care professional if they continue or are bothersome): -constipation -drowsy -dry mouth -headache -nausea or vomiting -stomach upset This list may not describe all possible side effects. Call your doctor for medical advice about side effects. You may report side effects to FDA at 1-800-FDA-1088. Where should I keep my medicine? Keep out of the reach of children. Store at room temperature between 15 and 30 degrees C (59 and 86 degrees F). Throw away any unused medicine after the expiration date. NOTE: This sheet is a summary. It may not cover all possible information. If you have questions about this medicine, talk to your doctor, pharmacist, or health care provider.  2019 Elsevier/Gold Standard (2015-04-23 12:24:15)

## 2018-07-10 LAB — HEMOGLOBIN A1C
Est. average glucose Bld gHb Est-mCnc: 229 mg/dL
Hgb A1c MFr Bld: 9.6 % — ABNORMAL HIGH (ref 4.8–5.6)

## 2018-07-11 ENCOUNTER — Telehealth: Payer: Self-pay | Admitting: Family Medicine

## 2018-07-11 MED ORDER — INSULIN GLARGINE 100 UNIT/ML SOLOSTAR PEN: PEN_INJECTOR | SUBCUTANEOUS | 11 refills | Status: DC

## 2018-07-11 NOTE — Telephone Encounter (Signed)
Contact patient to advise his recent A1C has increase to 9.6.   Continue Lantus 20 units in morning and increase evening dose Lantus 30 units. I will send over additional refill to [facility] and he can request a refill of his insulin if need due to increase in medication dose.   Continue glipizide 5 mg once daily with  breakfast. Continue Metformin twice daily.

## 2018-07-11 NOTE — Telephone Encounter (Signed)
Patient notified of lab results & recommendations. Expressed understanding. 

## 2018-07-20 MED FILL — LANTUS SOLOSTAR 100 UNITS/M: 100 | 30 days supply | Qty: 6 | Fill #2

## 2018-08-15 ENCOUNTER — Telehealth: Payer: Self-pay | Admitting: Cardiology

## 2018-08-15 NOTE — Telephone Encounter (Signed)
Patient scheduled for office visit with Dr. Tomie China 08/16/18 @ 1400. Patient will arrive at 1345. Gave [place] phone # and diirected to phone office from parking lot on arrival, staff will meet him for COVID screening prior to entering office. He verbalizes understanding, no further questions/concerns.

## 2018-08-15 NOTE — Telephone Encounter (Signed)
Can we bring him to the office and check him BP, puls, ekg, base on that we decide what to do

## 2018-08-15 NOTE — Telephone Encounter (Signed)
Pt started Ranexa 500mg  BID after 07/09/18 appt with Dr. Dulce Sellar. Before taking it he was having chest tightness. Now that he's been on it for a month, he's having dizziness, weakness, seeing "snow" and feeling nauseaus and hi still has chest tightness although its not as tight feeling as before.     He does not check his BP, pulse or weigh himself at home.      Pt states Dr. Dulce Sellar talked about prescribing nitroglycerin but didn't actually do so     pls advise, tx

## 2018-08-15 NOTE — Telephone Encounter (Signed)
Patient was put on Renexa 500mg  12hr tablet by doctor and it is making him dizzy, weak, and nauseous. Please advise.

## 2018-08-16 ENCOUNTER — Ambulatory Visit (INDEPENDENT_AMBULATORY_CARE_PROVIDER_SITE_OTHER): Payer: Self-pay | Admitting: Cardiology

## 2018-08-16 ENCOUNTER — Encounter: Payer: Self-pay | Admitting: Cardiology

## 2018-08-16 ENCOUNTER — Other Ambulatory Visit: Payer: Self-pay

## 2018-08-16 VITALS — BP 190/89 | HR 85 | Ht 68.0 in | Wt 155.6 lb

## 2018-08-16 DIAGNOSIS — R079 Chest pain, unspecified: Secondary | ICD-10-CM

## 2018-08-16 DIAGNOSIS — I10 Essential (primary) hypertension: Secondary | ICD-10-CM

## 2018-08-16 DIAGNOSIS — I2510 Atherosclerotic heart disease of native coronary artery without angina pectoris: Secondary | ICD-10-CM

## 2018-08-16 DIAGNOSIS — E782 Mixed hyperlipidemia: Secondary | ICD-10-CM

## 2018-08-16 MED ORDER — METOPROLOL SUCCINATE ER 25 MG PO TB24: 25.0000 mg | ORAL_TABLET | Freq: Two times a day (BID) | ORAL | 3 refills | Status: DC

## 2018-08-16 MED ORDER — NITROGLYCERIN 0.4 MG SL SUBL: 0.4000 mg | SUBLINGUAL_TABLET | SUBLINGUAL | 3 refills | Status: DC | PRN

## 2018-08-16 MED FILL — NITROGLYCERIN 0.4 MG TAB SL: 0.4 | 10 days supply | Qty: 25 | Fill #0

## 2018-08-16 MED FILL — ?METOPROLOL SUCC ER 25MG TA: 25 | 30 days supply | Qty: 60 | Fill #0

## 2018-08-16 NOTE — Patient Instructions (Addendum)
Medication Instructions:  Your physician has recommended you make the following change in your medication: Your physician recommends that you continue on your current medications as directed. Please refer to the Current Medication list given to you today. When having chest pain, stop what you are doing and sit down. Take 1 nitro, wait 5 minutes. Still having chest pain, take 1 nitro, wait 5 minutes. Still having chest pain, take 1 nitro, dial 911. Total of 3 nitro in 15 minutes.  START: Metoprolol Succinate 25 mg (Toprol XL) 1 tab daily for blood pressure.  If you need a refill on your cardiac medications before your next appointment, please call your pharmacy.   Lab work: Your physician recommends that you return for lab work in: TODAY TSH,BMP,CBC,Troponin  If you have labs (blood work) drawn today and your tests are completely normal, you will receive your results only by: [name] MyChart Message (if you have MyChart) OR . A paper copy in the mail If you have any lab test that is abnormal or we need to change your treatment, we will call you to review the results.  Testing/Procedures: EKG   Follow-Up: At [facility], you and your health needs are our priority.  As part of our continuing mission to provide you with exceptional heart care, we have created designated Provider Care Teams.  These Care Teams include your primary Cardiologist (physician) and Advanced Practice Providers (APPs -  Physician Assistants and Nurse Practitioners) who all work together to provide you with the care you need, when you need it. Any Other Special Instructions Will Be Listed Below (If Applicable).  Nitroglycerin sublingual tablets What is this medicine? NITROGLYCERIN (nye troe GLI ser in) is a type of vasodilator. It relaxes blood vessels, increasing the blood and oxygen supply to your heart. This medicine is used to relieve chest pain caused by angina. It is also used to prevent chest pain before activities  like climbing stairs, going outdoors in cold weather, or sexual activity. This medicine may be used for other purposes; ask your health care provider or pharmacist if you have questions. COMMON BRAND NAME(S): Nitroquick, Nitrostat, Nitrotab What should I tell my health care provider before I take this medicine? They need to know if you have any of these conditions: -anemia -head injury, recent stroke, or bleeding in the brain -liver disease -previous heart attack -an unusual or allergic reaction to nitroglycerin, other medicines, foods, dyes, or preservatives -pregnant or trying to get pregnant -breast-feeding How should I use this medicine? Take this medicine by mouth as needed. At the first sign of an angina attack (chest pain or tightness) place one tablet under your tongue. You can also take this medicine 5 to 10 minutes before an event likely to produce chest pain. Follow the directions on the prescription label. Let the tablet dissolve under the tongue. Do not swallow whole. Replace the dose if you accidentally swallow it. It will help if your mouth is not dry. Saliva around the tablet will help it to dissolve more quickly. Do not eat or drink, smoke or chew tobacco while a tablet is dissolving. If you are not better within 5 minutes after taking ONE dose of nitroglycerin, call 9-1-1 immediately to seek emergency medical care. Do not take more than 3 nitroglycerin tablets over 15 minutes. If you take this medicine often to relieve symptoms of angina, your doctor or health care professional may provide you with different instructions to manage your symptoms. If symptoms do not go away  after following these instructions, it is important to call 9-1-1 immediately. Do not take more than 3 nitroglycerin tablets over 15 minutes. Talk to your pediatrician regarding the use of this medicine in children. Special care may be needed. Overdosage: If you think you have taken too much of this medicine  contact a poison control center or emergency room at once. NOTE: This medicine is only for you. Do not share this medicine with others. What if I miss a dose? This does not apply. This medicine is only used as needed. What may interact with this medicine? Do not take this medicine with any of the following medications: -certain migraine medicines like ergotamine and dihydroergotamine (DHE) -medicines used to treat erectile dysfunction like sildenafil, tadalafil, and vardenafil -riociguat This medicine may also interact with the following medications: -alteplase -aspirin -heparin -medicines for high blood pressure -medicines for mental depression -other medicines used to treat angina -phenothiazines like chlorpromazine, mesoridazine, prochlorperazine, thioridazine This list may not describe all possible interactions. Give your health care provider a list of all the medicines, herbs, non-prescription drugs, or dietary supplements you use. Also tell them if you smoke, drink alcohol, or use illegal drugs. Some items may interact with your medicine. What should I watch for while using this medicine? Tell your doctor or health care professional if you feel your medicine is no longer working. Keep this medicine with you at all times. Sit or lie down when you take your medicine to prevent falling if you feel dizzy or faint after using it. Try to remain calm. This will help you to feel better faster. If you feel dizzy, take several deep breaths and lie down with your feet propped up, or bend forward with your head resting between your knees. You may get drowsy or dizzy. Do not drive, use machinery, or do anything that needs mental alertness until you know how this drug affects you. Do not stand or sit up quickly, especially if you are an older patient. This reduces the risk of dizzy or fainting spells. Alcohol can make you more drowsy and dizzy. Avoid alcoholic drinks. Do not treat yourself for coughs,  colds, or pain while you are taking this medicine without asking your doctor or health care professional for advice. Some ingredients may increase your blood pressure. What side effects may I notice from receiving this medicine? Side effects that you should report to your doctor or health care professional as soon as possible: -blurred vision -dry mouth -skin rash -sweating -the feeling of extreme pressure in the head -unusually weak or tired Side effects that usually do not require medical attention (report to your doctor or health care professional if they continue or are bothersome): -flushing of the face or neck -headache -irregular heartbeat, palpitations -nausea, vomiting This list may not describe all possible side effects. Call your doctor for medical advice about side effects. You may report side effects to FDA at 1-800-FDA-1088. Where should I keep my medicine? Keep out of the reach of children. Store at room temperature between 20 and 25 degrees C (68 and 77 degrees F). Store in Retail buyer. Protect from light and moisture. Keep tightly closed. Throw away any unused medicine after the expiration date. NOTE: This sheet is a summary. It may not cover all possible information. If you have questions about this medicine, talk to your doctor, pharmacist, or health care provider.  2019 Elsevier/Gold Standard (2013-01-17 17:57:36)  Metoprolol extended-release tablets What is this medicine? METOPROLOL (me TOE proe lole) is  a beta-blocker. Beta-blockers reduce the workload on the heart and help it to beat more regularly. This medicine is used to treat high blood pressure and to prevent chest pain. It is also used to after a heart attack and to prevent an additional heart attack from occurring. This medicine may be used for other purposes; ask your health care provider or pharmacist if you have questions. COMMON BRAND NAME(S): toprol, Toprol XL What should I tell my health care  provider before I take this medicine? They need to know if you have any of these conditions: -diabetes -heart or vessel disease like slow heart rate, worsening heart failure, heart block, sick sinus syndrome or Raynaud's disease -kidney disease -liver disease -lung or breathing disease, like asthma or emphysema -pheochromocytoma -thyroid disease -an unusual or allergic reaction to metoprolol, other beta-blockers, medicines, foods, dyes, or preservatives -pregnant or trying to get pregnant -breast-feeding How should I use this medicine? Take this medicine by mouth with a glass of water. Follow the directions on the prescription label. Do not crush or chew. Take this medicine with or immediately after meals. Take your doses at regular intervals. Do not take more medicine than directed. Do not stop taking this medicine suddenly. This could lead to serious heart-related effects. Talk to your pediatrician regarding the use of this medicine in children. While this drug may be prescribed for children as young as 6 years for selected conditions, precautions do apply. Overdosage: If you think you have taken too much of this medicine contact a poison control center or emergency room at once. NOTE: This medicine is only for you. Do not share this medicine with others. What if I miss a dose? If you miss a dose, take it as soon as you can. If it is almost time for your next dose, take only that dose. Do not take double or extra doses. What may interact with this medicine? This medicine may interact with the following medications: -certain medicines for blood pressure, heart disease, irregular heart beat -certain medicines for depression, like monoamine oxidase (MAO) inhibitors, fluoxetine, or paroxetine -clonidine -dobutamine -epinephrine -isoproterenol -reserpine This list may not describe all possible interactions. Give your health care provider a list of all the medicines, herbs, non-prescription  drugs, or dietary supplements you use. Also tell them if you smoke, drink alcohol, or use illegal drugs. Some items may interact with your medicine. What should I watch for while using this medicine? Visit your doctor or health care professional for regular check ups. Contact your doctor right away if your symptoms worsen. Check your blood pressure and pulse rate regularly. Ask your health care professional what your blood pressure and pulse rate should be, and when you should contact them. You may get drowsy or dizzy. Do not drive, use machinery, or do anything that needs mental alertness until you know how this medicine affects you. Do not sit or stand up quickly, especially if you are an older patient. This reduces the risk of dizzy or fainting spells. Contact your doctor if these symptoms continue. Alcohol may interfere with the effect of this medicine. Avoid alcoholic drinks. What side effects may I notice from receiving this medicine? Side effects that you should report to your doctor or health care professional as soon as possible: -allergic reactions like skin rash, itching or hives -cold or numb hands or feet -depression -difficulty breathing -faint -fever with sore throat -irregular heartbeat, chest pain -rapid weight gain -swollen legs or ankles Side effects that usually do  not require medical attention (report to your doctor or health care professional if they continue or are bothersome): -anxiety or nervousness -change in sex drive or performance -dry skin -headache -nightmares or trouble sleeping -short term memory loss -stomach upset or diarrhea -unusually tired This list may not describe all possible side effects. Call your doctor for medical advice about side effects. You may report side effects to FDA at 1-800-FDA-1088. Where should I keep my medicine? Keep out of the reach of children. Store at room temperature between 15 and 30 degrees C (59 and 86 degrees F). Throw  away any unused medicine after the expiration date. NOTE: This sheet is a summary. It may not cover all possible information. If you have questions about this medicine, talk to your doctor, pharmacist, or health care provider.  2019 Elsevier/Gold Standard (2012-11-23 14:41:37)

## 2018-08-16 NOTE — Addendum Note (Signed)
Addended by: Carren Rang on: 08/16/2018 03:04 PM   Modules accepted: Orders

## 2018-08-16 NOTE — Progress Notes (Signed)
Cardiology Office Note:    Date:  08/16/2018   ID:  Dylan Anderson, DOB [birth year], MRN [id number]  PCP:  Bing NeighborsHarris, Kimberly S, FNP  Cardiologist:  Garwin Brothersajan R Alexyia Guarino, MD   Referring MD: Bing NeighborsHarris, Kimberly S, FNP    ASSESSMENT:    1. Essential hypertension   2. Mild CAD   3. Chest pain in adult    PLAN:    In order of problems listed above:  1. Chest discomfort: Patient has coronary artery disease as mentioned in the coronary angiography report.  Secondary prevention stressed with the patient.  Importance of compliance with diet and medication stressed and he vocalized understanding.  Sublingual nitroglycerin prescription was sent, its protocol and 911 protocol explained and the patient vocalized understanding questions were answered to the patient's satisfaction we will do blood work today including Chem-7 CBC TSH liver panel and troponin.  His EKG done today was unremarkable.  His heart rate and blood pressure are elevated and we will initiate him on metoprolol succinate 25 mg twice daily. 2. Sublingual nitroglycerin prescription was sent, its protocol and 911 protocol explained and the patient vocalized understanding questions were answered to the patient's satisfaction 3. Essential hypertension: As mentioned above.  He will keep a track of his blood pressures and we will evaluate him in follow-up appointment with my partner.  I will also initiate him on metoprolol succinate 25 mg twice daily.  In the future he can take it 1 time in the morning which is 50 mg.  But he will currently start this medicine with 25 mg twice daily. 4. He knows to go to the nearest emergency room for any concerning symptoms. I counseled him to completely quit smoking.  He vocalized understanding of the risks and plans to do so.  Medication Adjustments/Labs and Tests Ordered: Current medicines are reviewed at length with the patient today.  Concerns regarding medicines are outlined above.  Orders Placed This  Encounter  Procedures  . TSH  . CBC  . Basic Metabolic Panel (BMET)  . Troponin I   Meds ordered this encounter  Medications  . nitroGLYCERIN (NITROSTAT) 0.4 MG SL tablet    Sig: Place 1 tablet (0.4 mg total) under the tongue every 5 (five) minutes as needed for chest pain.    Dispense:  90 tablet    Refill:  3     No chief complaint on file.    History of Present Illness:    Dylan Anderson is a 58 y.o. male with past medical history of coronary artery disease with recent coronary angiography as detailed below.  He has history of essential hypertension and dyslipidemia.  He mentions to me that occasionally has chest discomfort.  No orthopnea or PND.  This pretty much comes when he exerts himself.  He does not have nitroglycerin so he has not tried any.  At the time of my evaluation, the patient is alert awake oriented and in no distress.  Past Medical History:  Diagnosis Date  . Acute gout involving toe of left foot 09/21/2017  . Arthritis   . Back pain   . Chest pain in adult 05/26/2018  . Chronic cough 07/13/2017  . Claudication (HCC)   . Diabetes mellitus type II, non insulin dependent (HCC) 07/10/2012  . Erectile dysfunction   . Gastroesophageal reflux disease 04/27/2017  . Hand pain 03/18/2017  . Healthcare maintenance 07/12/2017  . Hip pain 05/20/2017  . Hyperlipidemia 01/25/2017  . Hypertension 07/10/2012  . Insomnia 02/06/2017  .  Low back pain 01/25/2017  . Peripheral artery disease (HCC) 06/07/2017  . PVD (peripheral vascular disease) (HCC)   . Tobacco abuse 07/10/2012  . Type 2 diabetes mellitus (HCC)     Past Surgical History:  Procedure Laterality Date  . ILIAC ARTERY STENT    . LEFT HEART CATH AND CORONARY ANGIOGRAPHY N/A 06/19/2018   Procedure: LEFT HEART CATH AND CORONARY ANGIOGRAPHY;  Surgeon: Lennette Bihari, MD;  Location: [facility];  Service: Cardiovascular;  Laterality: N/A;  . stent Left    left common and left internal illiac stent    Current  Medications: No outpatient medications have been marked as taking for the 08/16/18 encounter (Office Visit) with Martine Trageser, Aundra Dubin, MD.     Allergies:   Patient has no known allergies.   Social History   Socioeconomic History  . Marital status: Legally Separated    Spouse name: Not on file  . Number of children: Not on file  . Years of education: Not on file  . Highest education level: Not on file  Occupational History  . Occupation: unemployed  Social Needs  . Financial resource strain: Not on file  . Food insecurity:    Worry: Not on file    Inability: Not on file  . Transportation needs:    Medical: Not on file    Non-medical: Not on file  Tobacco Use  . Smoking status: Current Every Day Smoker    Packs/day: 0.50    Years: 17.00    Pack years: 8.50    Types: Cigarettes    Start date: 04/04/1978  . Smokeless tobacco: Never Used  . Tobacco comment: Max 1 ppd  Substance and Sexual Activity  . Alcohol use: Yes    Comment: 2-3 mixed drinks 3 times per week  . Drug use: No  . Sexual activity: Yes    Partners: Female  Lifestyle  . Physical activity:    Days per week: Not on file    Minutes per session: Not on file  . Stress: Not on file  Relationships  . Social connections:    Talks on phone: Not on file    Gets together: Not on file    Attends religious service: Not on file    Active member of club or organization: Not on file    Attends meetings of clubs or organizations: Not on file    Relationship status: Not on file  Other Topics Concern  . Not on file  Social History Narrative  . Not on file     Family History: The patient's family history includes COPD in his sister; Heart attack in his maternal aunt and maternal uncle; Heart failure in his mother; Osteoarthritis in his mother; Stroke in his sister.  ROS:   Please see the history of present illness.    All other systems reviewed and are negative.  EKGs/Labs/Other Studies Reviewed:    The following  studies were reviewed today:  EKG reveals sinus rhythm and nonspecific ST-T changes Dylan Anderson  CARDIAC CATHETERIZATION  Order# [id number]  Reading physician: Lennette Bihari, MD Ordering physician: Lennette Bihari, MD Study date: 06/19/18  Physicians   Panel Physicians Referring Physician Case Authorizing Physician  Lennette Bihari, MD (Primary)    Procedures   LEFT HEART CATH AND CORONARY ANGIOGRAPHY  Conclusion     Ost RCA to Prox RCA lesion is 10% stenosed.  Ost 1st Mrg to 1st Mrg lesion is 30% stenosed.  Ost Cx to  Prox Cx lesion is 30% stenosed.  Ost Ramus to Ramus lesion is 80% stenosed.  Ost LM to Mid LM lesion is 10% stenosed.  Ost LAD to Prox LAD lesion is 15% stenosed.  Mid Cx lesion is 20% stenosed.  Prox LAD lesion is 35% stenosed.  The left ventricular systolic function is normal.  LV end diastolic pressure is normal.  The left ventricular ejection fraction is greater than 65% by visual estimate.   Significant coronary calcification involving the left main, LAD, intermediate, and circumflex vessels.  There is smooth mild 10% narrowing of the left main.  The LAD has mild proximal irregularity of 10 to 15% followed by 30 to 35% proximal stenosis before diagonal vessel;  the ramus intermediate vessel is a very small caliber vessel which has 80% ostial proximal narrowing; the left circumflex vessel is a codominant vessel with calcification proximally and in the major marginal branch.  There is 25% proximal narrowing and 30% narrowing in the proximal circumflex marginal branch with an additional 20% narrowing in a inferior sub-branch; and RCA with mild haziness proximally with 10% narrowing.  Hyperdynamic LV function with an EF of at least 65% without focal segmental wall motion abnormalities.  LVEDP is 7 to 10 mm hg.  RECOMMENDATION: Medical therapy for optimal blood pressure control with target blood pressure less than 120/80.  Consider adding amlodipine or  beta-blocker to medical regimen.  Aggressive lipid-lowering therapy with target LDL less than 70 in an attempt to induce plaque regression.  Aspirin therapy for antiplatelet benefit.      Recent Labs: 02/08/2018: ALT 13 06/03/2018: B Natriuretic Peptide 16.1; Hemoglobin 12.7; Platelets 290 06/15/2018: BUN 21; Creatinine, Ser 1.28; Potassium 4.2; Sodium 138  Recent Lipid Panel    Component Value Date/Time   CHOL 232 (H) 02/08/2018 0932   TRIG 155 (H) 02/08/2018 0932   HDL 45 02/08/2018 0932   CHOLHDL 5.2 (H) 02/08/2018 0932   CHOLHDL 5.7 07/10/2012 1301   VLDL 36 07/10/2012 1301   LDLCALC 156 (H) 02/08/2018 0932    Physical Exam:    VS:  BP (!) 190/89 (BP Location: Right Arm, Patient Position: Sitting, Cuff Size: Normal)   Pulse 85   Ht 5\' 8"  (1.727 m)   Wt 155 lb 9.6 oz (70.6 kg)   BMI 23.66 kg/m     Wt Readings from Last 3 Encounters:  08/16/18 155 lb 9.6 oz (70.6 kg)  07/09/18 156 lb (70.8 kg)  07/09/18 156 lb 12.8 oz (71.1 kg)     GEN: Patient is in no acute distress HEENT: Normal NECK: No JVD; No carotid bruits LYMPHATICS: No lymphadenopathy CARDIAC: Hear sounds regular, 2/6 systolic murmur at the apex. RESPIRATORY:  Clear to auscultation without rales, wheezing or rhonchi  ABDOMEN: Soft, non-tender, non-distended MUSCULOSKELETAL:  No edema; No deformity  SKIN: Warm and dry NEUROLOGIC:  Alert and oriented x 3 PSYCHIATRIC:  Normal affect   Signed, Garwin Brothers, MD  08/16/2018 2:10 PM    [facility]

## 2018-08-17 ENCOUNTER — Telehealth: Payer: Self-pay

## 2018-08-17 LAB — BASIC METABOLIC PANEL
BUN/Creatinine Ratio: 17 (ref 9–20)
BUN: 20 mg/dL (ref 6–24)
CO2: 23 mmol/L (ref 20–29)
Calcium: 9.5 mg/dL (ref 8.7–10.2)
Chloride: 99 mmol/L (ref 96–106)
Creatinine, Ser: 1.19 mg/dL (ref 0.76–1.27)
GFR calc Af Amer: 78 mL/min/{1.73_m2} (ref >59)
GFR calc non Af Amer: 68 mL/min/{1.73_m2} (ref >59)
Glucose: 325 mg/dL — ABNORMAL HIGH (ref 65–99)
Potassium: 5 mmol/L (ref 3.5–5.2)
Sodium: 137 mmol/L (ref 134–144)

## 2018-08-17 LAB — TSH: TSH: 0.494 u[IU]/mL (ref 0.450–4.500)

## 2018-08-17 LAB — TROPONIN I: Troponin I: <0.01 ng/mL (ref 0.00–0.04)

## 2018-08-17 LAB — CBC
Hematocrit: 37.1 % — ABNORMAL LOW (ref 37.5–51.0)
Hemoglobin: 13.2 g/dL (ref 13.0–17.7)
MCH: 30.8 pg (ref 26.6–33.0)
MCHC: 35.6 g/dL (ref 31.5–35.7)
MCV: 87 fL (ref 79–97)
Platelets: 332 10*3/uL (ref 150–450)
RBC: 4.28 x10E6/uL (ref 4.14–5.80)
RDW: 13.2 % (ref 11.6–15.4)
WBC: 9.1 10*3/uL (ref 3.4–10.8)

## 2018-08-17 NOTE — Telephone Encounter (Signed)
-----   Message from Garwin Brothers, MD sent at 08/17/2018  8:13 AM EDT ----- The results of the study is unremarkable. Please inform patient. I will discuss in detail at next appointment. Cc  primary care/referring physician Garwin Brothers, MD 08/17/2018 8:13 AM

## 2018-08-17 NOTE — Telephone Encounter (Signed)
Patient called and notified of lab results,

## 2018-08-28 NOTE — Progress Notes (Signed)
Virtual Visit via Video Note   This visit type was conducted due to national recommendations for restrictions regarding the COVID-19 Pandemic (e.g. social distancing) in an effort to limit this patient's exposure and mitigate transmission in our community.  Due to his co-morbid illnesses, this patient is at least at moderate risk for complications without adequate follow up.  This format is felt to be most appropriate for this patient at this time.  All issues noted in this document were discussed and addressed.  A limited physical exam was performed with this format.  Please refer to the patient's chart for his consent to telehealth for [facility].   Date:  08/29/2018   ID:  Trudi Ida, DOB [birth year], MRN [id number]  Patient Location: Home Provider Location: Office  PCP:  Bing Neighbors, FNP  Cardiologist:  Norman Herrlich, MD  Electrophysiologist:  None   Evaluation Performed:  Follow-Up Visit  Chief Complaint:  Dizzy  History of Present Illness:    Dylan Anderson is a 58 y.o. male with coronary artery disease hypertension dyslipidemia last seen by Dr. Tomie China 08/16/2018.  Recent left heart catheterization 03/17/20showed mild diffuse CAD with a focal 80% ostial proximal narrowing of the ramus branch which was small in caliber and felt to be best treated medically.  He had diffuse coronary calcification.  The patient does not have symptoms concerning for COVID-19 infection (fever, chills, cough, or new shortness of breath).   He is not doing well he continues to have episodes of dizziness the symptoms are unimproved by stopping ranolazine.  They are orthostatic in nature at times severe with near loss of consciousness and he is taking an alpha-blocker for BPH his diabetes is out of control with osmotic diuresis and he has diabetic neuropathy.  His last A1c was 9.6%.  Unfortunately he has no blood pressure cuff at home he is trying access one that is financially limited I asked him  to go to the pharmacy a few times a week and record his blood pressures he has a history of frequent PVCs could be symptomatic and will harm him with a 1 week ZIO monitor and I will see him back in the office afterwards to reassess.  He had recent evaluation 06/19/2018 with nonobstructive CAD he is having no angina edema shortness of breath and has not had syncope.  Note he will need a lipid profile   Past Medical History:  Diagnosis Date  . Acute gout involving toe of left foot 09/21/2017  . Arthritis   . Back pain   . Chest pain in adult 05/26/2018  . Chronic cough 07/13/2017  . Claudication (HCC)   . Diabetes mellitus type II, non insulin dependent (HCC) 07/10/2012  . Erectile dysfunction   . Gastroesophageal reflux disease 04/27/2017  . Hand pain 03/18/2017  . Healthcare maintenance 07/12/2017  . Hip pain 05/20/2017  . Hyperlipidemia 01/25/2017  . Hypertension 07/10/2012  . Insomnia 02/06/2017  . Low back pain 01/25/2017  . Peripheral artery disease (HCC) 06/07/2017  . PVD (peripheral vascular disease) (HCC)   . Tobacco abuse 07/10/2012  . Type 2 diabetes mellitus (HCC)    Past Surgical History:  Procedure Laterality Date  . ILIAC ARTERY STENT    . LEFT HEART CATH AND CORONARY ANGIOGRAPHY N/A 06/19/2018   Procedure: LEFT HEART CATH AND CORONARY ANGIOGRAPHY;  Surgeon: Lennette Bihari, MD;  Location: [facility];  Service: Cardiovascular;  Laterality: N/A;  . stent Left    left common and  left internal illiac stent     Current Meds  Medication Sig  . amLODipine (NORVASC) 5 MG tablet Take 1 tablet (5 mg total) by mouth daily.  [name]. aspirin EC 81 MG tablet Take 1 tablet (81 mg total) by mouth daily.  [name]. atorvastatin (LIPITOR) 40 MG tablet Take 2 tablets (80 mg total) by mouth daily at 6 PM.  . gabapentin (NEURONTIN) 300 MG capsule Take 1 capsule (300 mg total) by mouth 3 (three) times daily.  [name]. glipiZIDE (GLUCOTROL) 5 MG tablet Take 1 tablet (5 mg total) by mouth daily before breakfast.   . Insulin Glargine (LANTUS) 100 UNIT/ML Solostar Pen Inject 20 units in skin morning and inject 30 units in skin at bedtime.  . Insulin Pen Needle (BD ULTRA-FINE PEN NEEDLES) 29G X 12.7MM MISC ICD10 E11.9 for use with insulin administration  . losartan (COZAAR) 50 MG tablet Take 50 mg by mouth 2 (two) times daily.  . metFORMIN (GLUCOPHAGE) 500 MG tablet Take 2 tablets (1,000 mg total) by mouth 2 (two) times daily with a meal.  . metoprolol succinate (TOPROL XL) 25 MG 24 hr tablet Take 1 tablet (25 mg total) by mouth 2 (two) times a day.  . naproxen sodium (ALEVE) 220 MG tablet Take 440 mg by mouth 2 (two) times daily as needed (pain).  . nitroGLYCERIN (NITROSTAT) 0.4 MG SL tablet Place 1 tablet (0.4 mg total) under the tongue every 5 (five) minutes as needed for chest pain.  . pantoprazole (PROTONIX) 20 MG tablet Take 1 tablet (20 mg total) by mouth daily.  . tamsulosin (FLOMAX) 0.4 MG CAPS capsule Take 1 capsule (0.4 mg total) by mouth daily.     Allergies:   Patient has no known allergies.   Social History   Tobacco Use  . Smoking status: Current Every Day Smoker    Packs/day: 0.50    Years: 17.00    Pack years: 8.50    Types: Cigarettes    Start date: 04/04/1978  . Smokeless tobacco: Never Used  . Tobacco comment: Max 1 ppd  Substance Use Topics  . Alcohol use: Yes    Comment: 2-3 mixed drinks 3 times per week  . Drug use: No     Family Hx: The patient's family history includes COPD in his sister; Heart attack in his maternal aunt and maternal uncle; Heart failure in his mother; Osteoarthritis in his mother; Stroke in his sister.  ROS:   Please see the history of present illness.     All other systems reviewed and are negative.   Prior CV studies:   The following studies were reviewed today:    Labs/Other Tests and Data Reviewed:    EKG:  An ECG dated 08/19/18 was personally reviewed today and demonstrated:  sinus rhytm incomplete RBBB non specific T waves  Recent  Labs: 02/08/2018: ALT 13 06/03/2018: B Natriuretic Peptide 16.1 08/16/2018: BUN 20; Creatinine, Ser 1.19; Hemoglobin 13.2; Platelets 332; Potassium 5.0; Sodium 137; TSH 0.494   Recent Lipid Panel Lab Results  Component Value Date/Time   CHOL 232 (H) 02/08/2018 09:32 AM   TRIG 155 (H) 02/08/2018 09:32 AM   HDL 45 02/08/2018 09:32 AM   CHOLHDL 5.2 (H) 02/08/2018 09:32 AM   CHOLHDL 5.7 07/10/2012 01:01 PM   LDLCALC 156 (H) 02/08/2018 09:32 AM    Wt Readings from Last 3 Encounters:  08/29/18 151 lb (68.5 kg)  08/16/18 155 lb 9.6 oz (70.6 kg)  07/09/18 156 lb (70.8 kg)  Objective:    Vital Signs:  Ht 5\' 10"  (1.778 m)   Wt 151 lb (68.5 kg)   BMI 21.67 kg/m    VITAL SIGNS:  reviewed GEN:  no acute distress EYES:  sclerae anicteric, EOMI - Extraocular Movements Intact RESPIRATORY:  normal respiratory effort, symmetric expansion CARDIOVASCULAR:  no peripheral edema SKIN:  no rash, lesions or ulcers. MUSCULOSKELETAL:  no obvious deformities. NEURO:  alert and oriented x 3, no obvious focal deficit PSYCH:  normal affect  ASSESSMENT & PLAN:    1. Mild CAD stable continue medical treatment including aspirin beta-blocker statin 2. Frequent PVCs may be responsible for symptoms of dizziness he is a 1 week ambulatory heart rhythm monitor reassess in the office 3. Hypertension continue current treatment he needs to check home blood pressures a day comes to my office in follow-up needs orthostatic signs and may need alterations of medications as he has diabetic neuropathy osmotic diuresis and is taking both a vasodilating calcium channel blocker and an alpha-blocker. 4. Hyperlipidemia continue statin he needs a lipid profile 5. Dizzy on improved by stopping ranolazine I do not think is the problem differential diagnosis includes arrhythmic or orthostatic etiology plan as outlined  COVID-19 Education: The signs and symptoms of COVID-19 were discussed with the patient and how to seek care  for testing (follow up with PCP or arrange E-visit).  The importance of social distancing was discussed today.  Time:   Today, I have spent 25 minutes with the patient with telehealth technology discussing the above problems.     Medication Adjustments/Labs and Tests Ordered: Current medicines are reviewed at length with the patient today.  Concerns regarding medicines are outlined above.   Tests Ordered: No orders of the defined types were placed in this encounter.   Medication Changes: No orders of the defined types were placed in this encounter.   Disposition:  Follow up in 4 week(s)  Signed, Norman Herrlich, MD  08/29/2018 9:07 AM    [facility]

## 2018-08-29 ENCOUNTER — Other Ambulatory Visit: Payer: Self-pay

## 2018-08-29 ENCOUNTER — Telehealth (INDEPENDENT_AMBULATORY_CARE_PROVIDER_SITE_OTHER): Payer: Self-pay | Admitting: Cardiology

## 2018-08-29 ENCOUNTER — Other Ambulatory Visit (INDEPENDENT_AMBULATORY_CARE_PROVIDER_SITE_OTHER): Payer: Self-pay

## 2018-08-29 ENCOUNTER — Encounter: Payer: Self-pay | Admitting: Cardiology

## 2018-08-29 VITALS — Ht 70.0 in | Wt 151.0 lb

## 2018-08-29 DIAGNOSIS — I2510 Atherosclerotic heart disease of native coronary artery without angina pectoris: Secondary | ICD-10-CM

## 2018-08-29 DIAGNOSIS — E782 Mixed hyperlipidemia: Secondary | ICD-10-CM

## 2018-08-29 DIAGNOSIS — R42 Dizziness and giddiness: Secondary | ICD-10-CM

## 2018-08-29 DIAGNOSIS — I493 Ventricular premature depolarization: Secondary | ICD-10-CM

## 2018-08-29 DIAGNOSIS — I10 Essential (primary) hypertension: Secondary | ICD-10-CM

## 2018-08-29 NOTE — Patient Instructions (Addendum)
Medication Instructions:  Your physician recommends that you continue on your current medications as directed. Please refer to the Current Medication list given to you today.  If you need a refill on your cardiac medications before your next appointment, please call your pharmacy.   Lab work: None  If you have labs (blood work) drawn today and your tests are completely normal, you will receive your results only by: [name] MyChart Message (if you have MyChart) OR . A paper copy in the mail If you have any lab test that is abnormal or we need to change your treatment, we will call you to review the results.  Testing/Procedures: Your physician has recommended that you wear a ZIO monitor. ZIO monitors are medical devices that record the heart's electrical activity. Doctors most often use these monitors to diagnose arrhythmias. Arrhythmias are problems with the speed or rhythm of the heartbeat. The monitor is a small, portable device. You can wear one while you do your normal daily activities. This is usually used to diagnose what is causing palpitations/syncope (passing out). Wear for 7 days.   Follow-Up: At [facility], you and your health needs are our priority.  As part of our continuing mission to provide you with exceptional heart care, we have created designated Provider Care Teams.  These Care Teams include your primary Cardiologist (physician) and Advanced Practice Providers (APPs -  Physician Assistants and Nurse Practitioners) who all work together to provide you with the care you need, when you need it. You will need a follow up appointment in 3 weeks: 09/21/2018 at 8:45 am in the [facility] office.

## 2018-09-20 NOTE — Progress Notes (Signed)
Cardiology Office Note:    Date:  09/21/2018   ID:  Dylan Anderson, DOB Dec 14, 1960, MRN [id number]  PCP:  No primary care provider on file.  Cardiologist:  Norman HerrlichBrian Yohana Bartha, MD    Referring MD: Bing NeighborsHarris, Kimberly S, FNP    ASSESSMENT:    1. Mild CAD   2. Essential hypertension   3. Mixed hyperlipidemia   4. Dizzy    PLAN:    In order of problems listed above:  1. Stable CAD continue medical therapy with aspirin beta-blocker calcium channel blocker and high intensity statin.  Refer to cardiac rehabilitation with chronic stable angina. 2. Hypertension stable no orthostasis continue current treatment including ARB beta-blocker calcium channel blocker systolic blood pressure at target 3. Continue high intensity statin with CAD screen for LP(a) excess check lipid profile if LDL is greater than 70 I would optimize therapy adding Zetia or PCSK9 4. Dizzy episodes very vague I do not think this is arrhythmic in etiology not related to orthostatic drop in blood pressure may be side effect of central active drugs I have asked him to limit his use of gabapentin.   Next appointment: 6 months   Medication Adjustments/Labs and Tests Ordered: Current medicines are reviewed at length with the patient today.  Concerns regarding medicines are outlined above.  No orders of the defined types were placed in this encounter.  No orders of the defined types were placed in this encounter.   Chief Complaint  Patient presents with   Follow-up    after Zio   Dizziness    History of Present Illness:    Dylan IdaBill Anderson is a 58 y.o. male with a hx of CAD, HTN, DLD, PVC last seen by me 08/29/18. Cath 06/19/18 mild diffuse CAD with focal 80% ostial proximal narrowing of the ramus branch which was small in caliber treated medically. Last office visit complaints of dizziness despite stopping ranolazine. Of note last A1c 9.6% and on alpha-blocker for BPH which could contribute to orthostatic nature of dizziness. He  presents today for follow up after 1 week Zio showing SR with minimum, average, and maximum HR 55, 76, 153 - no pauses - rare paroxysmal atrial tachycardia, PAT, rare ventricular ectopy.   Zio:    Narrative & Impression    An extended ZIO monitor was performed 7 days 3 hours beginning 09/08/2018 to assess palpitation. The rhythm throughout is sinus with minimum average and maximum heart rates of 55, 76 and 153 bpm. There were no pauses of 3 seconds or greater and no episodes of sinus node or AV nodal block. Ventricular ectopy is rare there were no triplets or episodes of ventricular tachycardia Supraventricular ectopy is rare there were 3 runs of atrial premature beats, paroxysmal atrial tachycardia, the longest 13.3 seconds at a rate of 119 bpm.  There are no episodes of atrial fibrillation or flutter. There were 2 diary events 1 associated with ventricular premature contractions the other sinus rhythm. There were 7 triggered events to associate with atrial premature beats the other 5 sinus rhythm  Conclusion unremarkable 7-day event monitor, symptoms and triggered events are poorly associated with the rare ventricular and supraventricular ectopy noticed   Dylan Anderson is seen in follow-up to several office visits and ambulatory heart rhythm monitor.  His central complaint was dizziness he had been on Minocin discontinued seen again in the office had a monitor applied and continues to have intermittent vague dizziness he is unsteady he has no vertigo no sense of fainting no associated  palpitation is not postural and is standing blood pressure today by me 126/70.  He takes Neurontin and may well be a side effect I asked him to take it only as needed and I do not think he needs further cardiac evaluation for arrhythmia.  He is on a high dose of the high intensity statin and he needs follow-up labs and will do a CMP LP(a) and a lipid profile and send a copy to his primary care physician he continues to have  wheezing and uses bronchodilators he short of breath at times he is having chest pain which is nonanginal it is brief momentary sharp nonexertional and has not needed nitroglycerin.  I think he would benefit from cardiac rehabilitation in terms of lifestyle diabetic cardiac education exercise and even psychology.  He is interested and will refer to cardiac rehab with the diagnosis of angina pectoris. Compliance with diet, lifestyle and medications: Yes Past Medical History:  Diagnosis Date   Acute gout involving toe of left foot 09/21/2017   Arthritis    Back pain    Chest pain in adult 05/26/2018   Chronic cough 07/13/2017   Claudication ([place])    Diabetes mellitus type II, non insulin dependent ([place]) 07/10/2012   Erectile dysfunction    Gastroesophageal reflux disease 04/27/2017   Hand pain 03/18/2017   Healthcare maintenance 07/12/2017   Hip pain 05/20/2017   Hyperlipidemia 01/25/2017   Hypertension 07/10/2012   Insomnia 02/06/2017   Low back pain 01/25/2017   Peripheral artery disease ([place]) 06/07/2017   PVD (peripheral vascular disease) ([place])    Tobacco abuse 07/10/2012   Type 2 diabetes mellitus ([place])     Past Surgical History:  Procedure Laterality Date   ILIAC ARTERY STENT     LEFT HEART CATH AND CORONARY ANGIOGRAPHY N/A 06/19/2018   Procedure: LEFT HEART CATH AND CORONARY ANGIOGRAPHY;  Surgeon: Troy Sine, MD;  Location: [place] [facility];  Service: Cardiovascular;  Laterality: N/A;   stent Left    left common and left internal illiac stent    Current Medications: Current Meds  Medication Sig   amLODipine (NORVASC) 5 MG tablet Take 1 tablet (5 mg total) by mouth daily.   aspirin EC 81 MG tablet Take 1 tablet (81 mg total) by mouth daily.   atorvastatin (LIPITOR) 40 MG tablet Take 2 tablets (80 mg total) by mouth daily at 6 PM.   gabapentin (NEURONTIN) 300 MG capsule Take 1 capsule (300 mg total) by mouth 3 (three) times daily.   glipiZIDE (GLUCOTROL)  5 MG tablet Take 1 tablet (5 mg total) by mouth daily before breakfast.   Insulin Glargine (LANTUS) 100 UNIT/ML Solostar Pen Inject 20 units in skin morning and inject 30 units in skin at bedtime.   Insulin Pen Needle (BD ULTRA-FINE PEN NEEDLES) 29G X 12.7MM MISC ICD10 E11.9 for use with insulin administration   losartan (COZAAR) 50 MG tablet Take 50 mg by mouth 2 (two) times daily.   metFORMIN (GLUCOPHAGE) 500 MG tablet Take 2 tablets (1,000 mg total) by mouth 2 (two) times daily with a meal.   metoprolol succinate (TOPROL XL) 25 MG 24 hr tablet Take 1 tablet (25 mg total) by mouth 2 (two) times a day.   naproxen sodium (ALEVE) 220 MG tablet Take 440 mg by mouth 2 (two) times daily as needed (pain).   nitroGLYCERIN (NITROSTAT) 0.4 MG SL tablet Place 1 tablet (0.4 mg total) under the tongue every 5 (five) minutes as needed for chest  pain.   pantoprazole (PROTONIX) 20 MG tablet Take 1 tablet (20 mg total) by mouth daily.   tamsulosin (FLOMAX) 0.4 MG CAPS capsule Take 1 capsule (0.4 mg total) by mouth daily.     Allergies:   Patient has no known allergies.   Social History   Socioeconomic History   Marital status: Legally Separated    Spouse name: Not on file   Number of children: Not on file   Years of education: Not on file   Highest education level: Not on file  Occupational History   Occupation: unemployed  [employer]   Financial resource strain: Not on file   Food insecurity    Worry: Not on file    Inability: Not on file   Transportation needs    Medical: Not on file    Non-medical: Not on file  Tobacco Use   Smoking status: Current Every Day Smoker    Packs/day: 0.50    Years: 17.00    Pack years: 8.50    Types: Cigarettes    Start date: 04/04/1978   Smokeless tobacco: Never Used   Tobacco comment: Max 1 ppd  Substance and Sexual Activity   Alcohol use: Yes    Comment: 2-3 mixed drinks 3 times per week   Drug use: No   Sexual activity: Yes     Partners: Female  Lifestyle   Physical activity    Days per week: Not on file    Minutes per session: Not on file   Stress: Not on file  Relationships   Social connections    Talks on phone: Not on file    Gets together: Not on file    Attends religious service: Not on file    Active member of club or organization: Not on file    Attends meetings of clubs or organizations: Not on file    Relationship status: Not on file  Other Topics Concern   Not on file  Social History Narrative   Not on file     Family History: The patient's family history includes COPD in his sister; Heart attack in his maternal aunt and maternal uncle; Heart failure in his mother; Osteoarthritis in his mother; Stroke in his sister. ROS:   Please see the history of present illness.    All other systems reviewed and are negative.  EKGs/Labs/Other Studies Reviewed:    The following studies were reviewed today:  Zio 08/29/18 An extended ZIO monitor was performed 7 days 3 hours beginning 09/08/2018 to assess palpitation. The rhythm throughout is sinus with minimum average and maximum heart rates of 55, 76 and 153 bpm. There were no pauses of 3 seconds or greater and no episodes of sinus node or AV nodal block. Ventricular ectopy is rare there were no triplets or episodes of ventricular tachycardia Supraventricular ectopy is rare there were 3 runs of atrial premature beats, paroxysmal atrial tachycardia, the longest 13.3 seconds at a rate of 119 bpm.  There are no episodes of atrial fibrillation or flutter. There were 2 diary events 1 associated with ventricular premature contractions the other sinus rhythm. There were 7 triggered events to associate with atrial premature beats the other 5 sinus rhythm   EKG:  EKG ordered today and personally reviewed.  The ekg ordered today demonstrates sinus rhythm and remains normal  Recent Labs: 02/08/2018: ALT 13 06/03/2018: B Natriuretic Peptide 16.1 08/16/2018: BUN  20; Creatinine, Ser 1.19; Hemoglobin 13.2; Platelets 332; Potassium 5.0; Sodium 137; TSH 0.494  Recent Lipid Panel    Component Value Date/Time   CHOL 232 (H) 02/08/2018 0932   TRIG 155 (H) 02/08/2018 0932   HDL 45 02/08/2018 0932   CHOLHDL 5.2 (H) 02/08/2018 0932   CHOLHDL 5.7 07/10/2012 1301   VLDL 36 07/10/2012 1301   LDLCALC 156 (H) 02/08/2018 0932    Physical Exam:    VS:  BP 120/62 (BP Location: Right Arm, Patient Position: Sitting, Cuff Size: Normal)    Pulse 72    Temp (!) 97.5 F (36.4 C)    Ht 5\' 10"  (1.778 m)    Wt 152 lb 12.8 oz (69.3 kg)    SpO2 100%    BMI 21.92 kg/m     Wt Readings from Last 3 Encounters:  09/21/18 152 lb 12.8 oz (69.3 kg)  08/29/18 151 lb (68.5 kg)  08/16/18 155 lb 9.6 oz (70.6 kg)     GEN:  Well nourished, well developed in no acute distress HEENT: Normal NECK: No JVD; No carotid bruits LYMPHATICS: No lymphadenopathy CARDIAC: RRR, no murmurs, rubs, gallops RESPIRATORY:  Clear to auscultation without rales, wheezing or rhonchi  ABDOMEN: Soft, non-tender, non-distended MUSCULOSKELETAL:  No edema; No deformity  SKIN: Warm and dry NEUROLOGIC:  Alert and oriented x 3 PSYCHIATRIC:  Normal affect    Signed, Norman HerrlichBrian Sharae Zappulla, MD  09/21/2018 9:31 AM    [facility]

## 2018-09-21 ENCOUNTER — Encounter: Payer: Self-pay | Admitting: Cardiology

## 2018-09-21 ENCOUNTER — Telehealth (HOSPITAL_COMMUNITY): Payer: Self-pay | Admitting: *Deleted

## 2018-09-21 ENCOUNTER — Ambulatory Visit (INDEPENDENT_AMBULATORY_CARE_PROVIDER_SITE_OTHER): Payer: Self-pay | Admitting: Cardiology

## 2018-09-21 ENCOUNTER — Other Ambulatory Visit: Payer: Self-pay

## 2018-09-21 VITALS — BP 120/62 | HR 72 | Temp 97.5°F | Ht 70.0 in | Wt 152.8 lb

## 2018-09-21 DIAGNOSIS — I10 Essential (primary) hypertension: Secondary | ICD-10-CM

## 2018-09-21 DIAGNOSIS — I2510 Atherosclerotic heart disease of native coronary artery without angina pectoris: Secondary | ICD-10-CM

## 2018-09-21 DIAGNOSIS — E782 Mixed hyperlipidemia: Secondary | ICD-10-CM

## 2018-09-21 DIAGNOSIS — R42 Dizziness and giddiness: Secondary | ICD-10-CM

## 2018-09-21 NOTE — Patient Instructions (Signed)
Medication Instructions:  Your physician recommends that you continue on your current medications as directed. Please refer to the Current Medication list given to you today.  If you need a refill on your cardiac medications before your next appointment, please call your pharmacy.   Lab work: Your physician recommends that you return for lab work today: CMP, lipid profile, lipoprotein A (LPa).   If you have labs (blood work) drawn today and your tests are completely normal, you will receive your results only by: [name]. MyChart Message (if you have MyChart) OR . A paper copy in the mail If you have any lab test that is abnormal or we need to change your treatment, we will call you to review the results.  Testing/Procedures: You had an EKG today.   You have been [facility] cardiac [facility]. You will be contacted to set this up.   Follow-Up: At [facility], you and your health needs are our priority.  As part of our continuing mission to provide you with exceptional heart care, we have created designated Provider Care Teams.  These Care Teams include your primary Cardiologist (physician) and Advanced Practice Providers (APPs -  Physician Assistants and Nurse Practitioners) who all work together to provide you with the care you need, when you need it. You will need a follow up appointment in 6 months.       Cardiac Rehabilitation What is cardiac rehabilitation? Cardiac rehabilitation is a treatment program that helps improve the health and well-being of people who have heart problems. Cardiac rehabilitation includes exercise training, education, and counseling to help you get stronger and return to an active lifestyle. This program can help you get better faster and reduce any future hospital stays. Why might I need cardiac rehabilitation? Cardiac rehabilitation programs can help when you have or have had:  A heart attack.  Heart failure.  Peripheral artery disease.   Coronary artery disease.  Angina.  Lung or breathing problems. Cardiac rehabilitation programs are also used when you have had:  Coronary artery bypass graft surgery.  Heart valve replacement.  Heart stent placement.  Heart transplant.  Aneurysm repair. What are the benefits of cardiac rehabilitation? Cardiac rehabilitation can help:  Reduce problems like chest pain and trouble breathing.  Change risk factors that contribute to heart disease, such as: ? Smoking. ? High blood pressure. ? High cholesterol. ? Diabetes. ? Being out of shape or not active. ? Weighing more than 30% higher than your ideal weight. ? Diet.  Improve your mental outlook so you feel: ? More hopeful. ? Better about yourself. ? More confident about taking care of yourself.  Get support from health experts as well as other people with similar problems.  Learn how to manage and understand your medicines.  Teach your family about your condition and how to participate in your recovery. What happens in cardiac rehabilitation? You will be assessed by a cardiac rehabilitation team. They will check your health history and do a physical exam. You may need blood tests, stress tests, and other evaluations to make sure that you are ready to start cardiac rehabilitation. The cardiac rehabilitation team works with you to make a plan based on your health and goals. Your program will be tailored to fit you and your needs and may change as you progress. You may work with a health care team that includes:  Doctors.  Nurses.  Dietitians.  Psychologists.  Exercise specialists.  Physical and occupational therapists. What are  the phases of cardiac rehabilitation? A cardiac rehabilitation program is often divided into phases. You advance from one phase to the next. Phase One  This phase starts while you are still in the hospital. You may start by walking in your room and then in the hall. You may start some  simple exercises with a therapist. Phase Two  This phase begins when you go home or to another facility. This phase may last 8-12 weeks. You will travel to a cardiac rehabilitation center or another place where rehabilitation is offered. You will slowly increase your activity level while being closely watched by a nurse or therapist. Exercises may include a combination of strength or resistance training and "cardio" or aerobic movement on a treadmill or other machines. Your condition will determine how often and how long these sessions last. In phase two, you may learn how to cook healthy meals, control your blood sugar, and manage your medicines. You may need help with scheduling or planning how and when to take your medicines. If you have questions about your medicines, it is very important that you talk to your health care provider. Phase Three This phase continues for the rest of your life. There will be less supervision. You may still participate in cardiac rehabilitation activities or become part of a group in your community. You may benefit from talking about your experience with other people who are facing similar challenges. Get help right away if:  You have severe chest discomfort, especially if the pain is crushing or pressure-like and spreads to your arms, back, neck, or jaw. Do not wait to see if the pain will go away.  You have weakness or numbness in your face, arms, or legs, especially on one side of the body.  Your speech is slurred.  You are confused.  You have a sudden severe headache or loss of vision.  You have shortness of breath.  You are sweating and have nausea.  You feel dizzy or faint.  You are fatigued. These symptoms may represent a serious problem that is an emergency. Do not wait to see if the symptoms will go away. Get medical help right away. Call your local emergency services (911 in the [place] Do not drive yourself to the hospital. This information is not  intended to replace advice given to you by your health care provider. Make sure you discuss any questions you have with your health care provider. Document Released: 12/29/2007 Document Revised: 12/12/2017 Document Reviewed: 02/02/2015 Elsevier Interactive Patient Education  2019 [employer].

## 2018-09-21 NOTE — Telephone Encounter (Signed)
Received referral for this pt to participate in Cardiac rehab from Dr. Bettina Gavia with the diagnosis for Stable angina.  Due to the departmental closure based on national recommendations for Covid-19 we are unable to provide group exercise at this time.  Called and spoke to pt. Pt is interested in participating in Virtual Cardiac Rehab. Pt advised that Virtual Cardiac Rehab is provided at no cost to the patient.  Checklist:  1. Pt has smart device  ie smartphone and/or ipad for downloading an app  Yes 2. Reliable internet/wifi service    No 3.   Understands how to use their smartphone and navigate within an app.  Yes   Right now, pt does not have internet service due to lining of the cable wires has cracks.  Whenever the ground gets wet, he loses connection.  Pt is looking to establish with a new internet service.  Pt has internet on his phone but his phone is not a smart device.  Pt is planning on getting an iphone soon.  Advised pt that we would not be able to proceed with virtual cardiac rehab.  Pt instructed to give [place] a call to schedule for intake appt once he has reliable internet and new phone,   Pt does not have medical insurance and would not be able to participate in group exercise here in the facility when we are permitted to schedule patients until he either gets medical insurance or is eligible for financial assistance.  Cherre Huger, BSN Cardiac and Training and development officer

## 2018-09-24 ENCOUNTER — Telehealth: Payer: Self-pay | Admitting: *Deleted

## 2018-09-24 ENCOUNTER — Encounter: Payer: Self-pay | Admitting: *Deleted

## 2018-09-24 LAB — COMPREHENSIVE METABOLIC PANEL
ALT: 18 IU/L (ref 0–44)
AST: 16 IU/L (ref 0–40)
Albumin/Globulin Ratio: 1.8 (ref 1.2–2.2)
Albumin: 4.2 g/dL (ref 3.8–4.9)
Alkaline Phosphatase: 89 IU/L (ref 39–117)
BUN/Creatinine Ratio: 9 (ref 9–20)
BUN: 11 mg/dL (ref 6–24)
Bilirubin Total: 0.3 mg/dL (ref 0.0–1.2)
CO2: 23 mmol/L (ref 20–29)
Calcium: 10 mg/dL (ref 8.7–10.2)
Chloride: 99 mmol/L (ref 96–106)
Creatinine, Ser: 1.18 mg/dL (ref 0.76–1.27)
GFR calc Af Amer: 79 mL/min/{1.73_m2} (ref >59)
GFR calc non Af Amer: 69 mL/min/{1.73_m2} (ref >59)
Globulin, Total: 2.3 g/dL (ref 1.5–4.5)
Glucose: 303 mg/dL — ABNORMAL HIGH (ref 65–99)
Potassium: 5.2 mmol/L (ref 3.5–5.2)
Sodium: 136 mmol/L (ref 134–144)
Total Protein: 6.5 g/dL (ref 6.0–8.5)

## 2018-09-24 LAB — LIPID PANEL
Chol/HDL Ratio: 6.8 ratio — ABNORMAL HIGH (ref 0.0–5.0)
Cholesterol, Total: 253 mg/dL — ABNORMAL HIGH (ref 100–199)
HDL: 37 mg/dL — ABNORMAL LOW (ref >39)
Triglycerides: 408 mg/dL — ABNORMAL HIGH (ref 0–149)

## 2018-09-24 LAB — LIPOPROTEIN A (LPA): Lipoprotein (a): 51.6 nmol/L (ref <75.0)

## 2018-09-24 NOTE — Telephone Encounter (Signed)
-----   Message from Richardo Priest, MD sent at 09/24/2018  7:51 AM EDT ----- Normal or stable result  His lipids are elevated but appears the predominant problem is diabetes out-of-control blood sugar greater than 300.  Before adjusting medications for her lipids and strongly encouraged him to see his primary care physician achieve his target hemoglobin A1c and then will reassess his lipids and make a decision.  He is route a copy of this to his PCP

## 2018-09-24 NOTE — Telephone Encounter (Signed)
Telephone call to patient. Informed of lab results and need to follow up with primary care regarding diabetes. Copy of labs sent to primary care and patient states has an upcoming appointment with her. Informed will re-evaluate lipids after A1C under control. Pt agreeable to plan and had no further questions.

## 2018-10-03 NOTE — Progress Notes (Deleted)
Cardiology Office Note:    Date:  10/03/2018   ID:  Dylan Anderson, DOB 16-Feb-1961, MRN [id number]  PCP:  Imagene Riches, NP  Cardiologist:  Shirlee More, MD    Referring MD: Scot Jun, FNP    ASSESSMENT:    No diagnosis found. PLAN:    In order of problems listed above:  1. ***   Next appointment: ***   Medication Adjustments/Labs and Tests Ordered: Current medicines are reviewed at length with the patient today.  Concerns regarding medicines are outlined above.  No orders of the defined types were placed in this encounter.  No orders of the defined types were placed in this encounter.   No chief complaint on file.   History of Present Illness:    Dylan Anderson is a 58 y.o. male with a hx of CAD, HTN, DLD, PVC last seen by me 08/29/18. Cath 06/19/18 mild diffuse CAD with focal 80% ostial proximal narrowing of the ramus branch which was small in caliber treated medically. Last office visit complaints of dizziness despite stopping ranolazine. Of note last A1c 9.6% and on alpha-blocker for BPH which could contribute to orthostatic nature of dizziness  He was  last seen 09/10/17. Compliance with diet, lifestyle and medications: *** Past Medical History:  Diagnosis Date  . Acute gout involving toe of left foot 09/21/2017  . Arthritis   . Back pain   . Chest pain in adult 05/26/2018  . Chronic cough 07/13/2017  . Claudication ([place])   . Diabetes mellitus type II, non insulin dependent ([place]) 07/10/2012  . Erectile dysfunction   . Gastroesophageal reflux disease 04/27/2017  . Hand pain 03/18/2017  . Healthcare maintenance 07/12/2017  . Hip pain 05/20/2017  . Hyperlipidemia 01/25/2017  . Hypertension 07/10/2012  . Insomnia 02/06/2017  . Low back pain 01/25/2017  . Peripheral artery disease ([place]) 06/07/2017  . PVD (peripheral vascular disease) ([place])   . Tobacco abuse 07/10/2012  . Type 2 diabetes mellitus ([place])     Past Surgical History:  Procedure Laterality Date  . ILIAC  ARTERY STENT    . LEFT HEART CATH AND CORONARY ANGIOGRAPHY N/A 06/19/2018   Procedure: LEFT HEART CATH AND CORONARY ANGIOGRAPHY;  Surgeon: Troy Sine, MD;  Location: [facility];  Service: Cardiovascular;  Laterality: N/A;  . stent Left    left common and left internal illiac stent    Current Medications: No outpatient medications have been marked as taking for the 10/04/18 encounter (Appointment) with Richardo Priest, MD.     Allergies:   Patient has no known allergies.   Social History   Socioeconomic History  . Marital status: Legally Separated    Spouse name: Not on file  . Number of children: Not on file  . Years of education: Not on file  . Highest education level: Not on file  Occupational History  . Occupation: unemployed  Social Needs  . Financial resource strain: Not on file  . Food insecurity    Worry: Not on file    Inability: Not on file  . Transportation needs    Medical: Not on file    Non-medical: Not on file  Tobacco Use  . Smoking status: Current Every Day Smoker    Packs/day: 0.50    Years: 17.00    Pack years: 8.50    Types: Cigarettes    Start date: 04/04/1978  . Smokeless tobacco: Never Used  . Tobacco comment: Max 1 ppd  Substance and Sexual  Activity  . Alcohol use: Yes    Comment: 2-3 mixed drinks 3 times per week  . Drug use: No  . Sexual activity: Yes    Partners: Female  Lifestyle  . Physical activity    Days per week: Not on file    Minutes per session: Not on file  . Stress: Not on file  Relationships  . Social Musicianconnections    Talks on phone: Not on file    Gets together: Not on file    Attends religious service: Not on file    Active member of club or organization: Not on file    Attends meetings of clubs or organizations: Not on file    Relationship status: Not on file  Other Topics Concern  . Not on file  Social History Narrative  . Not on file     Family History: The patient's ***family history includes COPD in  his sister; Heart attack in his maternal aunt and maternal uncle; Heart failure in his mother; Osteoarthritis in his mother; Stroke in his sister. ROS:   Please see the history of present illness.    All other systems reviewed and are negative.  EKGs/Labs/Other Studies Reviewed:    The following studies were reviewed today:  EKG:  EKG ordered today and personally reviewed.  The ekg ordered today demonstrates ***  Recent Labs: 06/03/2018: B Natriuretic Peptide 16.1 08/16/2018: Hemoglobin 13.2; Platelets 332; TSH 0.494 09/21/2018: ALT 18; BUN 11; Creatinine, Ser 1.18; Potassium 5.2; Sodium 136  Recent Lipid Panel    Component Value Date/Time   CHOL 253 (H) 09/21/2018 0955   TRIG 408 (H) 09/21/2018 0955   HDL 37 (L) 09/21/2018 0955   CHOLHDL 6.8 (H) 09/21/2018 0955   CHOLHDL 5.7 07/10/2012 1301   VLDL 36 07/10/2012 1301   LDLCALC Comment 09/21/2018 0955    Physical Exam:    VS:  There were no vitals taken for this visit.    Wt Readings from Last 3 Encounters:  09/21/18 152 lb 12.8 oz (69.3 kg)  08/29/18 151 lb (68.5 kg)  08/16/18 155 lb 9.6 oz (70.6 kg)     GEN: *** Well nourished, well developed in no acute distress HEENT: Normal NECK: No JVD; No carotid bruits LYMPHATICS: No lymphadenopathy CARDIAC: ***RRR, no murmurs, rubs, gallops RESPIRATORY:  Clear to auscultation without rales, wheezing or rhonchi  ABDOMEN: Soft, non-tender, non-distended MUSCULOSKELETAL:  No edema; No deformity  SKIN: Warm and dry NEUROLOGIC:  Alert and oriented x 3 PSYCHIATRIC:  Normal affect    Signed, Norman HerrlichBrian Salina Stanfield, MD  10/03/2018 6:07 PM    [facility]

## 2018-10-04 ENCOUNTER — Ambulatory Visit: Payer: Self-pay | Admitting: Cardiology

## 2018-10-08 ENCOUNTER — Telehealth: Payer: Self-pay

## 2018-10-08 NOTE — Telephone Encounter (Signed)

## 2018-10-09 ENCOUNTER — Ambulatory Visit (INDEPENDENT_AMBULATORY_CARE_PROVIDER_SITE_OTHER): Payer: Self-pay | Admitting: Family Medicine

## 2018-10-09 ENCOUNTER — Encounter: Payer: Self-pay | Admitting: Family Medicine

## 2018-10-09 ENCOUNTER — Other Ambulatory Visit: Payer: Self-pay

## 2018-10-09 VITALS — BP 124/70 | HR 75 | Temp 97.7°F | Resp 17 | Ht 68.0 in | Wt 152.8 lb

## 2018-10-09 DIAGNOSIS — E1165 Type 2 diabetes mellitus with hyperglycemia: Secondary | ICD-10-CM

## 2018-10-09 DIAGNOSIS — E782 Mixed hyperlipidemia: Secondary | ICD-10-CM

## 2018-10-09 DIAGNOSIS — I10 Essential (primary) hypertension: Secondary | ICD-10-CM

## 2018-10-09 LAB — GLUCOSE, POCT (MANUAL RESULT ENTRY): POC Glucose: 174 mg/dl — AB (ref 70–99)

## 2018-10-09 MED ORDER — INSULIN ASPART 100 UNIT/ML FLEXPEN: PEN_INJECTOR | SUBCUTANEOUS | 11 refills | Status: DC

## 2018-10-09 MED ORDER — PANTOPRAZOLE SODIUM 20 MG PO TBEC: 20.0000 mg | DELAYED_RELEASE_TABLET | Freq: Every day | ORAL | 8 refills | Status: DC

## 2018-10-09 MED ORDER — TAMSULOSIN HCL 0.4 MG PO CAPS: 0.4000 mg | ORAL_CAPSULE | Freq: Every day | ORAL | 3 refills | Status: DC

## 2018-10-09 MED ORDER — JARDIANCE 25 MG PO TABS: 25.0000 mg | ORAL_TABLET | Freq: Every day | ORAL | 5 refills | Status: DC

## 2018-10-09 MED ORDER — FENOFIBRATE 50 MG PO CAPS: 50.0000 mg | ORAL_CAPSULE | Freq: Every day | ORAL | 3 refills | Status: DC

## 2018-10-09 MED ORDER — AMLODIPINE BESYLATE 5 MG PO TABS: 5.0000 mg | ORAL_TABLET | Freq: Every day | ORAL | 3 refills | Status: DC

## 2018-10-09 MED ORDER — INSULIN GLARGINE 100 UNIT/ML SOLOSTAR PEN: PEN_INJECTOR | SUBCUTANEOUS | 11 refills | Status: DC

## 2018-10-09 MED ORDER — BD PEN NEEDLE ORIGINAL U/F 29G X 12.7MM MISC: 2 refills | Status: DC

## 2018-10-09 MED ORDER — GABAPENTIN 300 MG PO CAPS: ORAL_CAPSULE | ORAL | 3 refills | Status: DC

## 2018-10-09 MED ORDER — ATORVASTATIN CALCIUM 80 MG PO TABS: 80.0000 mg | ORAL_TABLET | Freq: Every day | ORAL | 2 refills | Status: DC

## 2018-10-09 MED FILL — ATORVASTATIN 80 MG TABLET: 80 | 30 days supply | Qty: 30 | Fill #0

## 2018-10-09 MED FILL — GABAPENTIN 300 MG CAPSULE: 300 | 30 days supply | Qty: 120 | Fill #0

## 2018-10-09 MED FILL — !NOVOLOG FLEXPEN SYRINGE 1: 100/ML | 33 days supply | Qty: 12 | Fill #0

## 2018-10-09 MED FILL — FENOFIBRATE 50 MG CAPSULE: 50 | 30 days supply | Qty: 30 | Fill #0

## 2018-10-09 MED FILL — PANTOPRAZOLE SOD DR 20 MG T: 20 | 30 days supply | Qty: 30 | Fill #0

## 2018-10-09 MED FILL — TRUEPLUS 5-BEVEL PEN NEEDLE: 31G X 5 MM | 25 days supply | Qty: 100 | Fill #0

## 2018-10-09 MED FILL — ?AMLODIPINE BESYLATE 5MG TA: 5 | 30 days supply | Qty: 30 | Fill #0

## 2018-10-09 MED FILL — TAMSULOSIN HCL 0.4 MG CAP: 0.4 | 30 days supply | Qty: 30 | Fill #0

## 2018-10-09 MED FILL — ?BASAGLAR 100 UNITS/ML KWPE: 100 | 30 days supply | Qty: 15 | Fill #0

## 2018-10-09 MED FILL — **JARDIANCE 25 MG TABLET: 25 | 30 days supply | Qty: 30 | Fill #0

## 2018-10-09 NOTE — Patient Instructions (Addendum)
Please review all medication changes made today closely. Discard all medications.   Current Outpatient Medications:   .  amLODipine (NORVASC) 5 MG tablet, Take 1 tablet (5 mg total) by mouth daily-Take   .  aspirin EC 81 MG tablet, Take 1 tablet (81 mg total) by mouth daily., Disp: 90 tablet, Rfl: 3 .  atorvastatin (LIPITOR) 80 MG tablet, Take 1 tablet (80 mg total) by mouth daily at 6 PM., Disp: 30 tablet, Rfl: 2 .  gabapentin (NEURONTIN) 300 MG capsule, Take 300 mg BID and 600 mg QHS, Disp: 90 capsule, Rfl: 3 .  Insulin Glargine (LANTUS) 100 UNIT/ML Solostar Pen, Inject 25 units in skin morning and inject 25 units in skin at bedtime., Disp: 15 mL, Rfl: 11 .  Insulin Pen Needle (BD ULTRA-FINE PEN NEEDLES) 29G X 12.7MM MISC, ICD10 E11.9 for use with insulin administration, Disp: 400 each, Rfl: 2 .  losartan (COZAAR) 50 MG tablet, Take 50 mg by mouth 2 (two) times daily., Disp: , Rfl:  .  metFORMIN (GLUCOPHAGE) 500 MG tablet, Take 2 tablets (1,000 mg total) by mouth 2 (two) times daily with a meal., Disp: 120 tablet, Rfl: 11 .  metoprolol succinate (TOPROL XL) 25 MG 24 hr tablet, Take 1 tablet (25 mg total) by mouth 2 (two) times a day., Disp: 90 tablet, Rfl: 3 .  naproxen sodium (ALEVE) 220 MG tablet, Take 440 mg by mouth 2 (two) times daily as needed (pain).  .  nitroGLYCERIN (NITROSTAT) 0.4 MG SL tablet, Place 1 tablet (0.4 mg total) under the tongue every 5 (five) minutes as needed for chest pain., Disp: 90 tablet, Rfl: 3 .  pantoprazole (PROTONIX) 20 MG tablet, Take 1 tablet (20 mg total) by mouth daily., Disp: 30 tablet, Rfl: 8 .  tamsulosin (FLOMAX) 0.4 MG CAPS capsule, Take 1 capsule (0.4 mg total) by mouth daily., Disp: 30 capsule, Rfl: 3 .  empagliflozin (JARDIANCE) 25 MG TABS tablet, Take 25 mg by mouth daily., Disp: 30 tablet, Rfl: 5 .  fenofibrate (TRICOR) 48 MG tablet, Take 1 tablet (48 mg total) by mouth daily., Disp: 30 tablet, Rfl: 2 .  insulin aspart (NOVOLOG) 100 UNIT/ML  FlexPen, Administer SS into skin 0-12 units 3 times per before meals 0-150=0 , 151-200=2, 201-250=4 , 251-300=6 , 301-300= 8 units, 351-400=10, above 400 12 units call office, Disp: 15 mL, Rfl: 11

## 2018-10-09 NOTE — Progress Notes (Signed)
Patient ID: Dylan Anderson, male    DOB: 13-Apr-1960, 58 y.o.   MRN: [id number]  PCP: Scot Jun, FNP  Chief Complaint  Patient presents with  . Diabetes  . Hypertension    Subjective:  HPI  Dylan Anderson is a 58 y.o. male presents for evaluation of diabetes and hypertension.  Dylan Anderson has Diabetes mellitus type II, non insulin dependent ([place]); Hypertension; Tobacco abuse; Hyperlipidemia; Erectile dysfunction; Low back pain; Insomnia; Hand pain; Gastroesophageal reflux disease; Hip pain; Peripheral artery disease ([place]); Healthcare maintenance; Chronic cough; Acute gout involving toe of left foot; Claudication ([place]); Chest pain in adult; PVC's (premature ventricular contractions); Abnormal cardiac CT angiography; Mild CAD; and Dizzy on their problem list.   Dylan Anderson has a history of very poorly controlled diabetes.  Of recent he was started back on long-acting insulin in addition to oral anti-diabetes medications.  In spite of these changes he has consistently had blood sugar readings greater than 200 with periods of hypoglycemia.  He is a long-time diabetic.  He reports throughout the course of the illness he has had difficulty achieving stable glycemic control.  His most recent A1C 9.6.  He recently had a lipid panel ran at his cardiology office.  He was found to have excessively high triglycerides.  He is currently on statin therapy, atorvastatin 80 mg along with aspirin for aggressive cholesterol therapy.  He suffers from underlying cardiovascular disease including mild coronary artery disease and peripheral arterial disease.  Unfortunately he continues to smoke daily.  He has been able to cut back some.  He has had chronic chest pain which was initially the reason for his referral to cardiology.  Reports it has gotten better although still continues at times.  He has taken some nitroglycerin within the last month or so.  He reports after 1 dose the chest pain goes away. He also has underlying  shortness of breath which is thought to be related to COPD.  He has an albuterol inhaler which he uses if symptom of SOB are worrisome.  He denies any recent wheezing and has noticed some improvement in work of breathing since he has decreased the number of cigarettes he smoked.  He has had in the past a pack to a pack and a half a day habit  Social History   Socioeconomic History  . Marital status: Legally Separated    Spouse name: Not on file  . Number of children: Not on file  . Years of education: Not on file  . Highest education level: Not on file  Occupational History  . Occupation: unemployed  Social Needs  . Financial resource strain: Not on file  . Food insecurity    Worry: Not on file    Inability: Not on file  . Transportation needs    Medical: Not on file    Non-medical: Not on file  Tobacco Use  . Smoking status: Current Every Day Smoker    Packs/day: 0.50    Years: 17.00    Pack years: 8.50    Types: Cigarettes    Start date: 04/04/1978  . Smokeless tobacco: Never Used  . Tobacco comment: Max 1 ppd  Substance and Sexual Activity  . Alcohol use: Yes    Comment: 2-3 mixed drinks 3 times per week  . Drug use: No  . Sexual activity: Yes    Partners: Female  Lifestyle  . Physical activity    Days per week: Not on file    Minutes per session:  Not on file  . Stress: Not on file  Relationships  . Social Musicianconnections    Talks on phone: Not on file    Gets together: Not on file    Attends religious service: Not on file    Active member of club or organization: Not on file    Attends meetings of clubs or organizations: Not on file    Relationship status: Not on file  . Intimate partner violence    Fear of current or ex partner: Not on file    Emotionally abused: Not on file    Physically abused: Not on file    Forced sexual activity: Not on file  Other Topics Concern  . Not on file  Social History Narrative  . Not on file    Family History  Problem Relation  Age of Onset  . Heart failure Mother   . Osteoarthritis Mother   . COPD Sister   . Stroke Sister   . Heart attack Maternal Aunt   . Heart attack Maternal Uncle    Review of Systems Pertinent negatives listed in HPI No Known Allergies  Prior to Admission medications   Medication Sig Start Date End Date Taking? Authorizing Provider  amLODipine (NORVASC) 5 MG tablet Take 1 tablet (5 mg total) by mouth daily. 10/09/18  Yes Bing NeighborsHarris, Dylanie Quesenberry S, FNP  aspirin EC 81 MG tablet Take 1 tablet (81 mg total) by mouth daily. 05/28/18  Yes Baldo DaubMunley, Brian J, MD  atorvastatin (LIPITOR) 80 MG tablet Take 1 tablet (80 mg total) by mouth daily at 6 PM. 10/09/18 01/07/19 Yes Bing NeighborsHarris, Cainan Trull S, FNP  gabapentin (NEURONTIN) 300 MG capsule Take 300 mg BID and 600 mg QHS 10/09/18  Yes Bing NeighborsHarris, Katey Barrie S, FNP  Insulin Glargine (LANTUS) 100 UNIT/ML Solostar Pen Inject 25 units in skin morning and inject 25 units in skin at bedtime. 10/09/18  Yes Bing NeighborsHarris, Manfred Laspina S, FNP  Insulin Pen Needle (BD ULTRA-FINE PEN NEEDLES) 29G X 12.7MM MISC ICD10 E11.9 for use with insulin administration 10/09/18  Yes Bing NeighborsHarris, Leniya Breit S, FNP  losartan (COZAAR) 50 MG tablet Take 50 mg by mouth 2 (two) times daily.   Yes [provider]  metFORMIN (GLUCOPHAGE) 500 MG tablet Take 2 tablets (1,000 mg total) by mouth 2 (two) times daily with a meal. 08/03/17  Yes Hensel, Santiago BumpersWilliam A, MD  metoprolol succinate (TOPROL XL) 25 MG 24 hr tablet Take 1 tablet (25 mg total) by mouth 2 (two) times a day. 08/16/18  Yes Revankar, Aundra Dubinajan R, MD  naproxen sodium (ALEVE) 220 MG tablet Take 440 mg by mouth 2 (two) times daily as needed (pain).   Yes [provider]  nitroGLYCERIN (NITROSTAT) 0.4 MG SL tablet Place 1 tablet (0.4 mg total) under the tongue every 5 (five) minutes as needed for chest pain. 08/16/18 11/14/18 Yes Revankar, Aundra Dubinajan R, MD  pantoprazole (PROTONIX) 20 MG tablet Take 1 tablet (20 mg total) by mouth daily. 10/09/18  Yes Bing NeighborsHarris, Stephane Junkins S, FNP   tamsulosin (FLOMAX) 0.4 MG CAPS capsule Take 1 capsule (0.4 mg total) by mouth daily. 10/09/18  Yes Bing NeighborsHarris, Masoud Nyce S, FNP  empagliflozin (JARDIANCE) 25 MG TABS tablet Take 25 mg by mouth daily. 10/09/18   Bing NeighborsHarris, Morad Tal S, FNP  Fenofibrate 50 MG CAPS Take 1 capsule (50 mg total) by mouth daily. 10/09/18   Bing NeighborsHarris, Dona Walby S, FNP  insulin aspart (NOVOLOG) 100 UNIT/ML FlexPen Administer SS into skin 0-12 units 3 times per before meals 0-150=0 , 151-200=2, 201-250=4 ,  251-300=6 , 301-300= 8 units, 351-400=10, above 400 12 units call office 10/09/18   Bing NeighborsHarris, Sophiah Rolin S, FNP    Past Medical, Surgical Family and Social History reviewed and updated.    Objective:   Today's Vitals   10/09/18 1355  BP: 124/70  Pulse: 75  Resp: 17  Temp: 97.7 F (36.5 C)  TempSrc: Temporal  SpO2: 97%  Weight: 152 lb 12.8 oz (69.3 kg)  Height: 5\' 8"  (1.727 m)    BP Readings from Last 3 Encounters:  10/09/18 124/70  09/21/18 120/62  08/16/18 (!) 190/89    Filed Weights   10/09/18 1355  Weight: 152 lb 12.8 oz (69.3 kg)       Physical Exam General appearance: alert, well developed, well nourished, cooperative and in no distress Head: Normocephalic, without obvious abnormality, atraumatic Respiratory: Respirations even and unlabored, normal respiratory rate Heart: rate and rhythm normal. No gallop or murmurs noted on exam  Extremities: No gross deformities Skin: Skin color, texture, turgor normal. No rashes seen  Psych: Appropriate mood and affect. Neurologic: Mental status: Alert, oriented to person, place, and time, thought content appropriate.  Lab Results  Component Value Date   POCGLU 174 (A) 10/09/2018   POCGLU 279 (A) 06/11/2018   POCGLU 290 (A) 05/11/2018    Lab Results  Component Value Date   HGBA1C 9.6 (H) 07/09/2018      Assessment & Plan:  1. Uncontrolled type 2 diabetes mellitus with hyperglycemia (HCC) -Based on blood sugar readings it is highly likely that A1c is in excess of  9.5 or worse.  Patient needs aggressive management of type 2 diabetes.  Unfortunately he is uninsured and unable to be referred to endocrinology which would be beneficial to his overall management of diabetes.  I am referring him to diabetes education as I think you will benefit from learning how to eat and what to eat. He will continue Lantus. Adding rapid acting insulin with a sliding scale which has been explained to him today.  He will administer sliding scale insulin based on blood sugars before meals.  He will continue on the metformin and adding Jardiance.  I am discontinuing glipizide as this will increase his risk of hypoglycemia. - Hemoglobin A1c, pending - Glucose (CBG)  2. Essential hypertension, stable today Blood pressure stable today continue amlodipine and losartan.  3. Mixed hyperlipidemia -Continue atorvastatin 80 mg -Continue aspirin 81 mg -Adding fenofibrate 48 mg once daily in efforts to improve triglycerides.   Return for follow-up in 8 weeks repeat A1c in order to evaluate whether or not level is trending downward.  Joaquin CourtsKimberly Rayden Dock, FNP [facility] [address], [place] [id number] [phone]: [phone]

## 2018-10-10 ENCOUNTER — Telehealth: Payer: Self-pay

## 2018-10-10 LAB — HEMOGLOBIN A1C
Est. average glucose Bld gHb Est-mCnc: 240 mg/dL
Hgb A1c MFr Bld: 10 % — ABNORMAL HIGH (ref 4.8–5.6)

## 2018-10-10 NOTE — Telephone Encounter (Signed)
Humalog is good and I think he can handle the 10.00 until pass is approved. I told him yesterday, he may have to pay up to 10.00 per prescription

## 2018-10-10 NOTE — Telephone Encounter (Signed)
Received fax from [facility].  Pharmacy wants to know if Novolog can be switched to Humalog in order for patient to receive for free. They also wanted to inform you that they do not have samples of the Jardiance & patient will have to pay up to $10 for medication until he is approved for PASS program. They do have samples of Januvia.  Please advise.

## 2018-10-11 MED ORDER — FENOFIBRATE 48 MG PO TABS: 48.0000 mg | ORAL_TABLET | Freq: Every day | ORAL | 2 refills | Status: DC

## 2018-10-11 MED FILL — FENOFIBRATE 48 MG TABLET: 48 | 30 days supply | Qty: 30 | Fill #0

## 2018-10-11 NOTE — Telephone Encounter (Signed)
Called [phone]) & spoke with Thomasena Edis to notify him of the information below. He stated that they will update Novolog to Humalog & get prescription ready for patient.

## 2018-10-11 NOTE — Progress Notes (Signed)
Patient notified of results & recommendations. Expressed understanding.

## 2018-10-14 NOTE — Addendum Note (Signed)
Addended by: Scot Jun on: 10/14/2018 07:43 PM   Modules accepted: Orders

## 2018-10-25 MED FILL — ?HUMALOG 100 UNITS/ML KWIKP: 100 | 33 days supply | Qty: 12 | Fill #0

## 2018-10-25 MED FILL — FENOFIBRATE 48 MG TABLET: 48 | 30 days supply | Qty: 30 | Fill #0

## 2018-10-25 MED FILL — TAMSULOSIN HCL 0.4 MG CAP: 0.4 | 30 days supply | Qty: 30 | Fill #0

## 2018-10-25 MED FILL — ATORVASTATIN 80 MG TABLET: 80 | 30 days supply | Qty: 30 | Fill #0

## 2018-10-25 MED FILL — GABAPENTIN 300 MG CAPSULE: 300 | 30 days supply | Qty: 120 | Fill #0

## 2018-10-25 MED FILL — ?BASAGLAR 100 UNITS/ML KWPE: 100 | 30 days supply | Qty: 15 | Fill #0

## 2018-10-25 MED FILL — PANTOPRAZOLE SOD DR 20 MG T: 20 | 30 days supply | Qty: 30 | Fill #0

## 2018-10-25 MED FILL — TRUEPLUS 5-BEVEL PEN NEEDLE: 31G X 5 MM | 25 days supply | Qty: 100 | Fill #0

## 2018-10-25 MED FILL — ?AMLODIPINE BESYLATE 5MG TA: 5 | 30 days supply | Qty: 30 | Fill #0

## 2018-10-25 MED FILL — JARDIANCE 25 MG TABLET: 25 | 14 days supply | Qty: 14 | Fill #0

## 2018-11-08 ENCOUNTER — Telehealth (HOSPITAL_COMMUNITY): Payer: Self-pay

## 2018-11-08 ENCOUNTER — Encounter (HOSPITAL_COMMUNITY): Payer: Self-pay

## 2018-11-21 ENCOUNTER — Ambulatory Visit: Payer: Self-pay | Admitting: Registered"

## 2018-11-22 ENCOUNTER — Other Ambulatory Visit: Payer: Self-pay

## 2018-11-22 ENCOUNTER — Encounter (HOSPITAL_COMMUNITY)
Admission: RE | Admit: 2018-11-22 | Discharge: 2018-11-22 | Disposition: A | Discharge status: Home / Self Care | Payer: Self-pay | Source: Ambulatory Visit | Attending: Cardiology | Admitting: Cardiology

## 2018-12-05 ENCOUNTER — Ambulatory Visit (INDEPENDENT_AMBULATORY_CARE_PROVIDER_SITE_OTHER): Payer: Self-pay | Admitting: Nurse Practitioner

## 2018-12-05 DIAGNOSIS — E1142 Type 2 diabetes mellitus with diabetic polyneuropathy: Secondary | ICD-10-CM

## 2018-12-05 DIAGNOSIS — E1165 Type 2 diabetes mellitus with hyperglycemia: Secondary | ICD-10-CM

## 2018-12-05 DIAGNOSIS — N529 Male erectile dysfunction, unspecified: Secondary | ICD-10-CM

## 2018-12-05 DIAGNOSIS — K219 Gastro-esophageal reflux disease without esophagitis: Secondary | ICD-10-CM

## 2018-12-05 DIAGNOSIS — E119 Type 2 diabetes mellitus without complications: Secondary | ICD-10-CM

## 2018-12-05 DIAGNOSIS — I10 Essential (primary) hypertension: Secondary | ICD-10-CM

## 2018-12-05 DIAGNOSIS — E782 Mixed hyperlipidemia: Secondary | ICD-10-CM

## 2018-12-05 MED ORDER — JARDIANCE 25 MG PO TABS: 25.0000 mg | ORAL_TABLET | Freq: Every day | ORAL | 1 refills | Status: AC

## 2018-12-05 MED ORDER — AMLODIPINE BESYLATE 5 MG PO TABS: 5.0000 mg | ORAL_TABLET | Freq: Every day | ORAL | 3 refills | Status: DC

## 2018-12-05 MED ORDER — BD PEN NEEDLE ORIGINAL U/F 29G X 12.7MM MISC: 2 refills | Status: DC

## 2018-12-05 MED ORDER — FENOFIBRATE 48 MG PO TABS: 48.0000 mg | ORAL_TABLET | Freq: Every day | ORAL | 2 refills | Status: DC

## 2018-12-05 MED ORDER — DULOXETINE HCL 30 MG PO CPEP: 30.0000 mg | ORAL_CAPSULE | Freq: Every day | ORAL | 1 refills | Status: DC

## 2018-12-05 MED ORDER — METOPROLOL SUCCINATE ER 50 MG PO TB24: 50.0000 mg | ORAL_TABLET | Freq: Every day | ORAL | 1 refills | Status: DC

## 2018-12-05 MED ORDER — TRUE METRIX METER W/DEVICE KIT: PACK | 0 refills | Status: DC

## 2018-12-05 MED ORDER — TRUEPLUS LANCETS 28G MISC: 3 refills | Status: DC

## 2018-12-05 MED ORDER — PANTOPRAZOLE SODIUM 20 MG PO TBEC: 20.0000 mg | DELAYED_RELEASE_TABLET | Freq: Every day | ORAL | 1 refills | Status: DC

## 2018-12-05 MED ORDER — INSULIN GLARGINE 100 UNIT/ML SOLOSTAR PEN: PEN_INJECTOR | SUBCUTANEOUS | 11 refills | Status: DC

## 2018-12-05 MED ORDER — TAMSULOSIN HCL 0.4 MG PO CAPS: 0.4000 mg | ORAL_CAPSULE | Freq: Every day | ORAL | 3 refills | Status: DC

## 2018-12-05 MED ORDER — TRUE METRIX BLOOD GLUCOSE TEST VI STRP: ORAL_STRIP | 12 refills | Status: DC

## 2018-12-05 MED ORDER — METFORMIN HCL 500 MG PO TABS: 1000.0000 mg | ORAL_TABLET | Freq: Two times a day (BID) | ORAL | 11 refills | Status: DC

## 2018-12-05 MED ORDER — ATORVASTATIN CALCIUM 80 MG PO TABS: 80.0000 mg | ORAL_TABLET | Freq: Every day | ORAL | 2 refills | Status: DC

## 2018-12-05 MED FILL — TRUEplus LANCETS 28G MISC: 30 days supply | Qty: 100 | Fill #0

## 2018-12-05 MED FILL — TAMSULOSIN HCL 0.4 MG CAP: 0.4 | 30 days supply | Qty: 30 | Fill #1

## 2018-12-05 MED FILL — !TRUE METRIX BLOOD GLUCOSE: 1 days supply | Qty: 1 | Fill #0

## 2018-12-05 MED FILL — TRUE METRIX TEST STRIP: 100 days supply | Qty: 100 | Fill #0

## 2018-12-05 MED FILL — PANTOPRAZOLE SOD DR 20 MG T: 20 | 30 days supply | Qty: 30 | Fill #1

## 2018-12-05 MED FILL — ?METOPROLOL SUCC ER 50MG TA: 50 | 30 days supply | Qty: 30 | Fill #0

## 2018-12-05 MED FILL — JARDIANCE 25 MG TABLET: 25 | 30 days supply | Qty: 30 | Fill #1

## 2018-12-05 MED FILL — ?BASAGLAR 100 UNITS/ML KWPE: 100 | 30 days supply | Qty: 15 | Fill #1

## 2018-12-05 MED FILL — ?METFORMIN HCL 500MG TABLET: 500 | 30 days supply | Qty: 120 | Fill #0

## 2018-12-05 MED FILL — ?DULoxetine HCL 30MG CPEP: 30 | 30 days supply | Qty: 30 | Fill #0

## 2018-12-05 MED FILL — FENOFIBRATE 48 MG TABLET: 48 | 30 days supply | Qty: 30 | Fill #1

## 2018-12-05 MED FILL — ATORVASTATIN 80 MG TABLET: 80 | 30 days supply | Qty: 30 | Fill #1

## 2018-12-05 NOTE — Progress Notes (Signed)
Assessment & Plan:  Diagnoses and all orders for this visit:  Diabetes mellitus type II, non insulin dependent (HCC) -     metFORMIN (GLUCOPHAGE) 500 MG tablet; Take 2 tablets (1,000 mg total) by mouth 2 (two) times daily with a meal. -     empagliflozin (JARDIANCE) 25 MG TABS tablet; Take 25 mg by mouth daily. -     Insulin Glargine (LANTUS) 100 UNIT/ML Solostar Pen; Inject 25 units in skin morning and inject 25 units in skin at bedtime. -     glucose blood (TRUE METRIX BLOOD GLUCOSE TEST) test strip; Use as instructed -     TRUEplus Lancets 28G MISC; Use as instructed. Check blood glucose level by fingerstick 3 times per day. -     Blood Glucose Monitoring Suppl (TRUE METRIX METER) w/Device KIT; Use as instructed. Check blood glucose level by fingerstick 3 times per day. -     Insulin Pen Needle (BD ULTRA-FINE PEN NEEDLES) 29G X 12.7MM MISC; ICD10 E11.9 for use with insulin administration -     Ambulatory referral to Ophthalmology Continue blood sugar control as discussed in office today, low carbohydrate diet, and regular physical exercise as tolerated, 150 minutes per week (30 min each day, 5 days per week, or 50 min 3 days per week). Keep blood sugar logs with fasting goal of 90-130 mg/dl, post prandial (after you eat) less than 180.  For Hypoglycemia: BS <60 and Hyperglycemia BS >400; contact the clinic ASAP. Annual eye exams and foot exams are recommended.  Diabetes is poorly controlled. Advised patient to keep a fasting blood sugar log fast, 2 hours post lunch and bedtime which will be reviewed at the next office visit.  Essential hypertension -     amLODipine (NORVASC) 5 MG tablet; Take 1 tablet (5 mg total) by mouth daily. -     metoprolol succinate (TOPROL-XL) 50 MG 24 hr tablet; Take 1 tablet (50 mg total) by mouth at bedtime. Continue all antihypertensives as prescribed.  Remember to bring in your blood pressure log with you for your follow up appointment.  DASH/Mediterranean  Diets are healthier choices for HTN.   Mixed hyperlipidemia -     atorvastatin (LIPITOR) 80 MG tablet; Take 1 tablet (80 mg total) by mouth daily at 6 PM. -     fenofibrate (TRICOR) 48 MG tablet; Take 1 tablet (48 mg total) by mouth daily. INSTRUCTIONS: Work on a low fat, heart healthy diet and participate in regular aerobic exercise program by working out at least 150 minutes per week; 5 days a week-30 minutes per day. Avoid red meat, fried foods. junk foods, sodas, sugary drinks, unhealthy snacking, alcohol and smoking.  Drink at least 48oz of water per day and monitor your carbohydrate intake daily.    Erectile dysfunction, unspecified erectile dysfunction type -     tamsulosin (FLOMAX) 0.4 MG CAPS capsule; Take 1 capsule (0.4 mg total) by mouth daily.  Diabetic polyneuropathy associated with type 2 diabetes mellitus (HCC) -     DULoxetine (CYMBALTA) 30 MG capsule; Take 1 capsule (30 mg total) by mouth daily. STOP SMOKING!!!   GERD without esophagitis -     pantoprazole (PROTONIX) 20 MG tablet; Take 1 tablet (20 mg total) by mouth daily. INSTRUCTIONS: Avoid GERD Triggers: acidic, spicy or fried foods, caffeine, coffee, sodas,  alcohol and chocolate.   Patient has been counseled on age-appropriate routine health concerns for screening and prevention. These are reviewed and up-to-date. Referrals have been placed accordingly. Immunizations  are up-to-date or declined.    Subjective:   Chief Complaint  Patient presents with  . Diabetes   Virtual Visit via Telephone Note Due to national recommendations of social distancing due to [place] 19, telehealth visit is felt to be most appropriate for this patient at this time.  I discussed the limitations, risks, security and privacy concerns of performing an evaluation and management service by telephone and the availability of in person appointments. I also discussed with the patient that there may be a patient responsible charge related to this  service. The patient expressed understanding and agreed to proceed.    I connected with Dylan Anderson on 12/05/18  at   1:50 PM EDT  EDT by telephone and verified that I am speaking with the correct person using two identifiers.   Consent I discussed the limitations, risks, security and privacy concerns of performing an evaluation and management service by telephone and the availability of in person appointments. I also discussed with the patient that there may be a patient responsible charge related to this service. The patient expressed understanding and agreed to proceed.   Location of Patient: Private  RESIDENCE   Location of Provider: [place]-Private Office    Persons participating in Telemedicine visit: Dylan Rankins FNP-BC Dylan Anderson    History of Present Illness: Telemedicine visit for: FOLLOW UP  has a past medical history of Acute gout involving toe of left foot (09/21/2017), Arthritis, Back pain, Chest pain in adult (05/26/2018), Chronic cough (07/13/2017), Claudication ([place]), Diabetes mellitus type II, non insulin dependent ([place]) (07/10/2012), Erectile dysfunction, Gastroesophageal reflux disease (04/27/2017), Hand pain (03/18/2017), Healthcare maintenance (07/12/2017), Hip pain (05/20/2017), Hyperlipidemia (01/25/2017), Hypertension (07/10/2012), Insomnia (02/06/2017), Low back pain (01/25/2017), Peripheral artery disease ([place]) (06/07/2017), PVD (peripheral vascular disease) ([place]), Tobacco abuse (07/10/2012), and Type 2 diabetes mellitus ([place]).    Diabetes Mellitus Type 2 Poorly controlled. Patient states that his fasting readings have been in the upper 200s. In the afternoon readings have been in the 140s. Has c/o fatigue, numbness/tingling in feet & legs. Very poor dietary habits: Drinking koolaid, sweet tea. Eating sandwiches, snack bars with high carbs.  He does not exercise.  Overdue for eye exam.  Patient is uninsured at this time. Patient has been advised to apply for  financial assistance and schedule to see our financial counselor.  Current medications include Jardiance 25 mg daily, metformin 1000 mg twice daily, NovoLog sliding scale insulin, and Lantus 25 units twice daily.  He endorses hyperglycemic symptoms of peripheral neuropathy. Taking ARB [id number]:[id number].   Lab Results  Component Value Date   HGBA1C 10.0 (H) 10/09/2018   Essential Hypertension Chronic and well controlled. Currently taking losartan 50 mg BID, toprol orXL 50 mg daily, and amlodipine 5 mg daily.  BP Readings from Last 3 Encounters:  10/09/18 124/70  09/21/18 120/62  08/16/18 (!) 190/89    Mixed Hyperlipidemia Poorly controlled. Not taking tricor as prescribed. He has only been taking atorvastatin 48m. Will refill tricor. He denies any statin intolerance or myalgias. Still eating too much beef!!! Lab Results  Component Value Date   CHOL 253 (H) 09/21/2018   CHOL 232 (H) 02/08/2018   CHOL 216 (H) 01/13/2017   Lab Results  Component Value Date   HDL 37 (L) 09/21/2018   HDL 45 02/08/2018   HDL 50 01/13/2017   Lab Results  Component Value Date   LDLCALC Comment 09/21/2018   LDLCALC 156 (H) 02/08/2018   LDLCALC 149 (H) 01/13/2017  Lab Results  Component Value Date   TRIG 408 (H) 09/21/2018   TRIG 155 (H) 02/08/2018   TRIG 83 01/13/2017   Lab Results  Component Value Date   CHOLHDL 6.8 (H) 09/21/2018   CHOLHDL 5.2 (H) 02/08/2018   CHOLHDL 4.3 01/13/2017   Review of Systems  Constitutional: Negative for fever, malaise/fatigue and weight loss.  HENT: Negative.  Negative for nosebleeds.   Eyes: Negative.  Negative for blurred vision, double vision and photophobia.  Respiratory: Negative.  Negative for cough and shortness of breath.   Cardiovascular: Positive for claudication. Negative for chest pain, palpitations and leg swelling.  Gastrointestinal: Positive for heartburn. Negative for nausea and vomiting.  Genitourinary:       ED  Musculoskeletal: Positive for  joint pain. Negative for myalgias.  Neurological: Positive for tingling and sensory change. Negative for dizziness, focal weakness, seizures and headaches.  Psychiatric/Behavioral: Negative.  Negative for suicidal ideas.    Past Medical History:  Diagnosis Date  . Acute gout involving toe of left foot 09/21/2017  . Arthritis   . Back pain   . Chest pain in adult 05/26/2018  . Chronic cough 07/13/2017  . Claudication ([place])   . Diabetes mellitus type II, non insulin dependent ([place]) 07/10/2012  . Erectile dysfunction   . Gastroesophageal reflux disease 04/27/2017  . Hand pain 03/18/2017  . Healthcare maintenance 07/12/2017  . Hip pain 05/20/2017  . Hyperlipidemia 01/25/2017  . Hypertension 07/10/2012  . Insomnia 02/06/2017  . Low back pain 01/25/2017  . Peripheral artery disease ([place]) 06/07/2017  . PVD (peripheral vascular disease) ([place])   . Tobacco abuse 07/10/2012  . Type 2 diabetes mellitus ([place])     Past Surgical History:  Procedure Laterality Date  . ILIAC ARTERY STENT    . LEFT HEART CATH AND CORONARY ANGIOGRAPHY N/A 06/19/2018   Procedure: LEFT HEART CATH AND CORONARY ANGIOGRAPHY;  Surgeon: Troy Sine, MD;  Location: [facility];  Service: Cardiovascular;  Laterality: N/A;  . stent Left    left common and left internal illiac stent    Family History  Problem Relation Age of Onset  . Heart failure Mother   . Osteoarthritis Mother   . COPD Sister   . Stroke Sister   . Heart attack Maternal Aunt   . Heart attack Maternal Uncle     Social History Reviewed with no changes to be made today.   Outpatient Medications Prior to Visit  Medication Sig Dispense Refill  . aspirin EC 81 MG tablet Take 1 tablet (81 mg total) by mouth daily. 90 tablet 3  . insulin aspart (NOVOLOG) 100 UNIT/ML FlexPen Administer SS into skin 0-12 units 3 times per before meals 0-150=0 , 151-200=2, 201-250=4 , 251-300=6 , 301-300= 8 units, 351-400=10, above 400 12 units call office 15 mL 11  .  losartan (COZAAR) 50 MG tablet Take 50 mg by mouth 2 (two) times daily.    . naproxen sodium (ALEVE) 220 MG tablet Take 440 mg by mouth 2 (two) times daily as needed (pain).    [name] amLODipine (NORVASC) 5 MG tablet Take 1 tablet (5 mg total) by mouth daily. 90 tablet 3  . atorvastatin (LIPITOR) 80 MG tablet Take 1 tablet (80 mg total) by mouth daily at 6 PM. 30 tablet 2  . empagliflozin (JARDIANCE) 25 MG TABS tablet Take 25 mg by mouth daily. 30 tablet 5  . fenofibrate (TRICOR) 48 MG tablet Take 1 tablet (48 mg total) by mouth daily.  30 tablet 2  . gabapentin (NEURONTIN) 300 MG capsule Take 300 mg BID and 600 mg QHS 90 capsule 3  . Insulin Glargine (LANTUS) 100 UNIT/ML Solostar Pen Inject 25 units in skin morning and inject 25 units in skin at bedtime. 15 mL 11  . Insulin Pen Needle (BD ULTRA-FINE PEN NEEDLES) 29G X 12.7MM MISC ICD10 E11.9 for use with insulin administration 400 each 2  . metFORMIN (GLUCOPHAGE) 500 MG tablet Take 2 tablets (1,000 mg total) by mouth 2 (two) times daily with a meal. 120 tablet 11  . metoprolol succinate (TOPROL XL) 25 MG 24 hr tablet Take 1 tablet (25 mg total) by mouth 2 (two) times a day. 90 tablet 3  . pantoprazole (PROTONIX) 20 MG tablet Take 1 tablet (20 mg total) by mouth daily. 30 tablet 8  . tamsulosin (FLOMAX) 0.4 MG CAPS capsule Take 1 capsule (0.4 mg total) by mouth daily. 30 capsule 3  . nitroGLYCERIN (NITROSTAT) 0.4 MG SL tablet Place 1 tablet (0.4 mg total) under the tongue every 5 (five) minutes as needed for chest pain. 90 tablet 3   No facility-administered medications prior to visit.     No Known Allergies     Objective:    There were no vitals taken for this visit. Wt Readings from Last 3 Encounters:  10/09/18 152 lb 12.8 oz (69.3 kg)  09/21/18 152 lb 12.8 oz (69.3 kg)  08/29/18 151 lb (68.5 kg)       Patient has been counseled extensively about nutrition and exercise as well as the importance of adherence with medications and regular  follow-up. The patient was given clear instructions to go to ER or return to medical center if symptoms don't improve, worsen or new problems develop. The patient verbalized understanding.   Total time spent with patient was 25 minGreater than 50 % of this visit was spent face to face counseling and coordinating care regarding risk factor modification, compliance importance and encouragement, education related to his chronic health conditions.  Follow-up: Return in about 7 weeks (around 01/23/2019).   Gildardo Pounds, FNP-BC [facility], [place]   12/05/2018, 6:21 PM

## 2018-12-05 NOTE — Progress Notes (Signed)
Patient states that his fasting readings have been in the upper 200s. In the afternoon readings have been in the 140s. Has c/o fatigue, numbness/tingling in feet & legs  Wants to know if he can get a glucometer sent to [address].

## 2018-12-06 MED FILL — TRUEPLUS PEN NDL 31G X 1/4: 31G X 6 MM | 30 days supply | Qty: 100 | Fill #0

## 2018-12-06 MED FILL — TRUEPLUS PEN NDL 31G X 1/4": 31G X 6 MM | 30 days supply | Qty: 100 | Fill #0

## 2019-01-10 ENCOUNTER — Ambulatory Visit: Payer: Self-pay

## 2019-01-15 ENCOUNTER — Telehealth: Payer: Self-pay

## 2019-01-15 NOTE — Telephone Encounter (Signed)
Called patient to do their pre-visit COVID screening.  Call went to voicemail. Unable to do prescreening.  

## 2019-01-16 ENCOUNTER — Other Ambulatory Visit: Payer: Self-pay

## 2019-01-16 ENCOUNTER — Ambulatory Visit (INDEPENDENT_AMBULATORY_CARE_PROVIDER_SITE_OTHER): Payer: Self-pay | Admitting: Nurse Practitioner

## 2019-01-16 VITALS — BP 146/84 | HR 71 | Temp 97.5°F | Resp 17 | Wt 150.6 lb

## 2019-01-16 DIAGNOSIS — E119 Type 2 diabetes mellitus without complications: Secondary | ICD-10-CM

## 2019-01-16 DIAGNOSIS — I779 Disorder of arteries and arterioles, unspecified: Secondary | ICD-10-CM

## 2019-01-16 DIAGNOSIS — I10 Essential (primary) hypertension: Secondary | ICD-10-CM

## 2019-01-16 DIAGNOSIS — E782 Mixed hyperlipidemia: Secondary | ICD-10-CM

## 2019-01-16 DIAGNOSIS — F331 Major depressive disorder, recurrent, moderate: Secondary | ICD-10-CM

## 2019-01-16 LAB — GLUCOSE, POCT (MANUAL RESULT ENTRY): POC Glucose: 245 mg/dl — AB (ref 70–99)

## 2019-01-16 MED ORDER — LOSARTAN POTASSIUM 100 MG PO TABS: 100.0000 mg | ORAL_TABLET | Freq: Every day | ORAL | 1 refills | Status: DC

## 2019-01-16 MED ORDER — CILOSTAZOL 100 MG PO TABS: 100.0000 mg | ORAL_TABLET | Freq: Two times a day (BID) | ORAL | 0 refills | Status: AC

## 2019-01-16 MED ORDER — BUPROPION HCL ER (SR) 150 MG PO TB12: 150.0000 mg | ORAL_TABLET | Freq: Two times a day (BID) | ORAL | 2 refills | Status: DC

## 2019-01-16 MED FILL — LOSARTAN POTASSIUM 100 MG T: 100 | 30 days supply | Qty: 30 | Fill #0

## 2019-01-16 MED FILL — CILOSTAZOL 100 MG TABS: 100 | 30 days supply | Qty: 60 | Fill #0

## 2019-01-16 MED FILL — BUPROPION SR 150 MG TABLET: 150 | 30 days supply | Qty: 60 | Fill #0

## 2019-01-16 NOTE — Progress Notes (Signed)
Patient is fasting today.  Declines flu shot.  Still has numbness/tingling in his feet, polyuria, polydipsia. Denies nausea, vomiting.  States that AM readings(240s) are higher than evenings(170s).

## 2019-01-16 NOTE — Progress Notes (Signed)
Assessment & Plan:  Dylan Anderson was seen today for diabetes, hypertension and hyperlipidemia.  Diagnoses and all orders for this visit:  Diabetes mellitus type II, non insulin dependent (HCC) -     Glucose (CBG) -     Hemoglobin A1c Continue blood sugar control as discussed in office today, low carbohydrate diet, and regular physical exercise as tolerated, 150 minutes per week (30 min each day, 5 days per week, or 50 min 3 days per week). Keep blood sugar logs with fasting goal of 90-130 mg/dl, post prandial (after you eat) less than 180.  For Hypoglycemia: BS <60 and Hyperglycemia BS >400; contact the clinic ASAP. Annual eye exams and foot exams are recommended.   Essential hypertension -     Basic Metabolic Panel -     losartan (COZAAR) 100 MG tablet; Take 1 tablet (100 mg total) by mouth daily. Continue all antihypertensives as prescribed.  Remember to bring in your blood pressure log with you for your follow up appointment.  DASH/Mediterranean Diets are healthier choices for HTN.    Mixed hyperlipidemia -     Lipid panel INSTRUCTIONS: Work on a low fat, heart healthy diet and participate in regular aerobic exercise program by working out at least 150 minutes per week; 5 days a week-30 minutes per day. Avoid red meat, fried foods. junk foods, sodas, sugary drinks, unhealthy snacking, alcohol and smoking.  Drink at least 48oz of water per day and monitor your carbohydrate intake daily.    PAOD (peripheral arterial occlusive disease) (HCC) -     cilostazol (PLETAL) 100 MG tablet; Take 1 tablet (100 mg total) by mouth 2 (two) times daily. -     Ambulatory referral to Vascular Surgery  Moderate episode of recurrent major depressive disorder (HCC) -     buPROPion (WELLBUTRIN SR) 150 MG 12 hr tablet; Take 1 tablet (150 mg total) by mouth 2 (two) times daily. Was initially taking cymbalta for neuropathy however has been ineffective likely due to pain from BLE claudication. Will stop cymbalta  and add wellbutrin for depression and smoking cessation.    Patient has been counseled on age-appropriate routine health concerns for screening and prevention. These are reviewed and up-to-date. Referrals have been placed accordingly. Immunizations are up-to-date or declined.    Subjective:   Chief Complaint  Patient presents with  . Diabetes  . Hypertension  . Hyperlipidemia   HPI Dylan Anderson 58 y.o. male presents to office today for follow up.  has a past medical history of Acute gout involving toe of left foot (09/21/2017), Arthritis, Back pain, Chest pain in adult (05/26/2018), Chronic cough (07/13/2017), Claudication ([place]), Diabetes mellitus type II, non insulin dependent ([place]) (07/10/2012), Erectile dysfunction, Gastroesophageal reflux disease (04/27/2017), Hand pain (03/18/2017), Healthcare maintenance (07/12/2017), Hip pain (05/20/2017), Hyperlipidemia (01/25/2017), Hypertension (07/10/2012), Insomnia (02/06/2017), Low back pain (01/25/2017), Peripheral artery disease ([place]) (06/07/2017), PVD (peripheral vascular disease) ([place]), Tobacco abuse (07/10/2012), and Type 2 diabetes mellitus ([place]).  He has complaints of BLE pain. Worse at night and difficult to sleep through pain. He has a history of PAOD with left common iliac stent placed at [facility] several years ago. He continues to smoke.  Evaluated by  [facility] 12-18-2017: On 08-03-2017 pt hadeasily palpable posterior tibial pulses bilaterally. His iliac stent was patent with mild in-stent restenosis. Difficult to know whether his symptoms are related to worsening peripheral arterial disease as his buttock and thigh symptoms certainly sound like buttock claudication. This may be due primarily to internal  iliac artery occlusive disease which is not treatable. However, we had discussions today regarding lifestyle changes such as a walking program of 30 minutes daily as well as stopping smoking.  The patient was totry to walk 30 minutes daily. He was totry  to quit smoking. He was toreturn for follow-up appointment and see our nurse practitioner in 3 months with exercise ABIs at that office visit. If these are abnormal we will consider intervention at that point. Otherwise conservative management forthe time.  Today patient states he did not follow up for exercise ABIs with [facility]. Will need to be referred back for evaluation. Patient has been advised to apply for financial assistance and schedule to see our financial counselor.  Pletal was ordered for claudication today.    Hypertension He tries to stay active and is not consistently adherent to low salt diet.  He does not have a blood pressure log  today.  Cardiac symptoms claudication and BLE edema at night. Patient denies chest pain and chest pressure/discomfort.  Cardiovascular risk factors: advanced age (older than 38 for men, 60 for women), diabetes mellitus, dyslipidemia, hypertension, male gender and smoking/ tobacco exposure.  BP Readings from Last 3 Encounters:  01/16/19 (!) 146/84  10/09/18 124/70  09/21/18 120/62    Hyperlipidemia Patient presents for follow up to hyperlipidemia.  He is medication compliant taking atorvastatin 80 mg daily. He is not consistently diet compliant and denies statin intolerance including myalgias. LDL not at goal of <70.  Repeat lipid panel pending today.  Lab Results  Component Value Date   CHOL 253 (H) 09/21/2018   Lab Results  Component Value Date   HDL 37 (L) 09/21/2018   Lab Results  Component Value Date   LDLCALC Comment 09/21/2018   Lab Results  Component Value Date   TRIG 408 (H) 09/21/2018   Lab Results  Component Value Date   CHOLHDL 6.8 (H) 09/21/2018      Diabetes Mellitus Type II Current symptoms/problems include paresthesia of the feet and have been worsening.  Known diabetic complications: peripheral neuropathy, cardiovascular disease and peripheral vascular disease Current diabetic medications include:  Jardiance 25  mg daily, metformin 1000 mg twice daily, NovoLog sliding scale insulin, and Lantus 25 units twice daily.  He endorses hyperglycemic symptoms of peripheral neuropathy. Taking ARB GNO:IBBCWU.   Eye exam current (within one year): no however eye resources have been sent to Mr. Mitro Weight trend: stable Prior visit with dietician: no Current monitoring regimen: home blood tests - infrequently Home blood sugar records: trend: 140-200s Any episodes of hypoglycemia? yes - after mowing the lawn one day noted for BG 30. We discussed carbohydrate intake prior to increased activity such as exercising or mowing the yard Is He on ACE inhibitor or angiotensin II receptor blocker?  Yes  Lab Results  Component Value Date   HGBA1C 10.0 (H) 10/09/2018   HGBA1C 9.6 (H) 07/09/2018   HGBA1C 8.9 (H) 05/11/2018     Review of Systems  Constitutional: Negative for fever, malaise/fatigue and weight loss.  HENT: Negative.  Negative for nosebleeds.   Eyes: Negative.  Negative for blurred vision, double vision and photophobia.  Respiratory: Negative.  Negative for cough and shortness of breath.   Cardiovascular: Positive for claudication and leg swelling. Negative for chest pain and palpitations.  Gastrointestinal: Positive for heartburn. Negative for nausea and vomiting.  Musculoskeletal: Positive for back pain and joint pain (right shoulder). Negative for myalgias.  Neurological: Positive for sensory change. Negative for dizziness, focal  weakness, seizures and headaches.  Psychiatric/Behavioral: Positive for depression. Negative for suicidal ideas.    Past Medical History:  Diagnosis Date  . Acute gout involving toe of left foot 09/21/2017  . Arthritis   . Back pain   . Chest pain in adult 05/26/2018  . Chronic cough 07/13/2017  . Claudication ([place])   . Diabetes mellitus type II, non insulin dependent ([place]) 07/10/2012  . Erectile dysfunction   . Gastroesophageal reflux disease 04/27/2017  . Hand pain  03/18/2017  . Healthcare maintenance 07/12/2017  . Hip pain 05/20/2017  . Hyperlipidemia 01/25/2017  . Hypertension 07/10/2012  . Insomnia 02/06/2017  . Low back pain 01/25/2017  . Peripheral artery disease ([place]) 06/07/2017  . PVD (peripheral vascular disease) ([place])   . Tobacco abuse 07/10/2012  . Type 2 diabetes mellitus ([place])     Past Surgical History:  Procedure Laterality Date  . ILIAC ARTERY STENT    . LEFT HEART CATH AND CORONARY ANGIOGRAPHY N/A 06/19/2018   Procedure: LEFT HEART CATH AND CORONARY ANGIOGRAPHY;  Surgeon: Troy Sine, MD;  Location: [facility];  Service: Cardiovascular;  Laterality: N/A;  . stent Left    left common and left internal illiac stent    Family History  Problem Relation Age of Onset  . Heart failure Mother   . Osteoarthritis Mother   . COPD Sister   . Stroke Sister   . Heart attack Maternal Aunt   . Heart attack Maternal Uncle     Social History Reviewed with no changes to be made today.   Outpatient Medications Prior to Visit  Medication Sig Dispense Refill  . amLODipine (NORVASC) 5 MG tablet Take 1 tablet (5 mg total) by mouth daily. 90 tablet 3  . aspirin EC 81 MG tablet Take 1 tablet (81 mg total) by mouth daily. 90 tablet 3  . atorvastatin (LIPITOR) 80 MG tablet Take 1 tablet (80 mg total) by mouth daily at 6 PM. 90 tablet 2  . Blood Glucose Monitoring Suppl (TRUE METRIX METER) w/Device KIT Use as instructed. Check blood glucose level by fingerstick 3 times per day. 1 kit 0  . empagliflozin (JARDIANCE) 25 MG TABS tablet Take 25 mg by mouth daily. 90 tablet 1  . fenofibrate (TRICOR) 48 MG tablet Take 1 tablet (48 mg total) by mouth daily. 90 tablet 2  . glucose blood (TRUE METRIX BLOOD GLUCOSE TEST) test strip Use as instructed 100 each 12  . insulin aspart (NOVOLOG) 100 UNIT/ML FlexPen Administer SS into skin 0-12 units 3 times per before meals 0-150=0 , 151-200=2, 201-250=4 , 251-300=6 , 301-300= 8 units, 351-400=10, above 400 12  units call office 15 mL 11  . Insulin Glargine (LANTUS) 100 UNIT/ML Solostar Pen Inject 25 units in skin morning and inject 25 units in skin at bedtime. 15 mL 11  . Insulin Pen Needle (BD ULTRA-FINE PEN NEEDLES) 29G X 12.7MM MISC ICD10 E11.9 for use with insulin administration 400 each 2  . metFORMIN (GLUCOPHAGE) 500 MG tablet Take 2 tablets (1,000 mg total) by mouth 2 (two) times daily with a meal. 120 tablet 11  . metoprolol succinate (TOPROL-XL) 50 MG 24 hr tablet Take 1 tablet (50 mg total) by mouth at bedtime. 90 tablet 1  . naproxen sodium (ALEVE) 220 MG tablet Take 440 mg by mouth 2 (two) times daily as needed (pain).    . nitroGLYCERIN (NITROSTAT) 0.4 MG SL tablet Place 1 tablet (0.4 mg total) under the tongue every 5 (five)  minutes as needed for chest pain. 90 tablet 3  . pantoprazole (PROTONIX) 20 MG tablet Take 1 tablet (20 mg total) by mouth daily. 90 tablet 1  . tamsulosin (FLOMAX) 0.4 MG CAPS capsule Take 1 capsule (0.4 mg total) by mouth daily. 90 capsule 3  . TRUEplus Lancets 28G MISC Use as instructed. Check blood glucose level by fingerstick 3 times per day. 100 each 3  . DULoxetine (CYMBALTA) 30 MG capsule Take 1 capsule (30 mg total) by mouth daily. 90 capsule 1  . losartan (COZAAR) 50 MG tablet Take 50 mg by mouth 2 (two) times daily.     No facility-administered medications prior to visit.     No Known Allergies     Objective:    BP (!) 146/84   Pulse 71   Temp (!) 97.5 F (36.4 C) (Temporal)   Resp 17   Wt 150 lb 9.6 oz (68.3 kg)   SpO2 98%   BMI 22.90 kg/m  Wt Readings from Last 3 Encounters:  01/16/19 150 lb 9.6 oz (68.3 kg)  10/09/18 152 lb 12.8 oz (69.3 kg)  09/21/18 152 lb 12.8 oz (69.3 kg)    Physical Exam Vitals signs and nursing note reviewed.  Constitutional:      Appearance: He is well-developed.  HENT:     Head: Normocephalic and atraumatic.  Neck:     Musculoskeletal: Normal range of motion.  Cardiovascular:     Rate and Rhythm: Normal  rate and regular rhythm.     Heart sounds: Normal heart sounds. No murmur. No friction rub. No gallop.   Pulmonary:     Effort: Pulmonary effort is normal. No tachypnea or respiratory distress.     Breath sounds: Normal breath sounds. No decreased breath sounds, wheezing, rhonchi or rales.  Chest:     Chest wall: No tenderness.  Abdominal:     General: Bowel sounds are normal.     Palpations: Abdomen is soft.  Musculoskeletal: Normal range of motion.  Skin:    General: Skin is warm and dry.  Neurological:     Mental Status: He is alert and oriented to person, place, and time.     Coordination: Coordination normal.  Psychiatric:        Behavior: Behavior normal. Behavior is cooperative.        Thought Content: Thought content normal.        Judgment: Judgment normal.          Patient has been counseled extensively about nutrition and exercise as well as the importance of adherence with medications and regular follow-up. The patient was given clear instructions to go to ER or return to medical center if symptoms don't improve, worsen or new problems develop. The patient verbalized understanding.   Follow-up: No follow-ups on file.   Gildardo Pounds, FNP-BC [facility] [place], [place]   01/16/2019, 12:47 PM

## 2019-01-17 LAB — BASIC METABOLIC PANEL
BUN/Creatinine Ratio: 15 (ref 9–20)
BUN: 17 mg/dL (ref 6–24)
CO2: 23 mmol/L (ref 20–29)
Calcium: 10 mg/dL (ref 8.7–10.2)
Chloride: 104 mmol/L (ref 96–106)
Creatinine, Ser: 1.12 mg/dL (ref 0.76–1.27)
GFR calc Af Amer: 84 mL/min/{1.73_m2} (ref >59)
GFR calc non Af Amer: 73 mL/min/{1.73_m2} (ref >59)
Glucose: 247 mg/dL — ABNORMAL HIGH (ref 65–99)
Potassium: 5 mmol/L (ref 3.5–5.2)
Sodium: 142 mmol/L (ref 134–144)

## 2019-01-17 LAB — LIPID PANEL
Chol/HDL Ratio: 4.7 ratio (ref 0.0–5.0)
Cholesterol, Total: 215 mg/dL — ABNORMAL HIGH (ref 100–199)
HDL: 46 mg/dL (ref >39)
LDL Chol Calc (NIH): 137 mg/dL — ABNORMAL HIGH (ref 0–99)
Triglycerides: 178 mg/dL — ABNORMAL HIGH (ref 0–149)
VLDL Cholesterol Cal: 32 mg/dL (ref 5–40)

## 2019-01-17 LAB — HEMOGLOBIN A1C
Est. average glucose Bld gHb Est-mCnc: 192 mg/dL
Hgb A1c MFr Bld: 8.3 % — ABNORMAL HIGH (ref 4.8–5.6)

## 2019-01-17 MED FILL — NITROGLYCERIN 0.4 MG TAB SL: 0.4 | 10 days supply | Qty: 25 | Fill #1

## 2019-01-18 ENCOUNTER — Other Ambulatory Visit: Payer: Self-pay | Admitting: Nurse Practitioner

## 2019-01-18 MED ORDER — INSULIN ASPART 100 UNIT/ML FLEXPEN: 5.0000 [IU] | PEN_INJECTOR | Freq: Two times a day (BID) | SUBCUTANEOUS | 6 refills | Status: DC

## 2019-01-18 MED FILL — ?HUMALOG 100 UNITS/ML KWIKP: 100 | 28 days supply | Qty: 3 | Fill #0

## 2019-01-22 NOTE — Progress Notes (Signed)
Patient notified of results & recommendations. Expressed understanding. Made follow up appointment for 04/24/2019 at 2:30 PM.

## 2019-01-29 ENCOUNTER — Ambulatory Visit (HOSPITAL_COMMUNITY)
Admission: EM | Admit: 2019-01-29 | Discharge: 2019-01-29 | Disposition: A | Discharge status: Home / Self Care | Payer: Self-pay | Attending: Family Medicine | Admitting: Family Medicine

## 2019-01-29 ENCOUNTER — Ambulatory Visit (INDEPENDENT_AMBULATORY_CARE_PROVIDER_SITE_OTHER): Payer: Self-pay

## 2019-01-29 ENCOUNTER — Encounter (HOSPITAL_COMMUNITY): Payer: Self-pay

## 2019-01-29 ENCOUNTER — Other Ambulatory Visit: Payer: Self-pay

## 2019-01-29 DIAGNOSIS — S91339A Puncture wound without foreign body, unspecified foot, initial encounter: Secondary | ICD-10-CM

## 2019-01-29 DIAGNOSIS — W450XXA Nail entering through skin, initial encounter: Secondary | ICD-10-CM

## 2019-01-29 DIAGNOSIS — S91332A Puncture wound without foreign body, left foot, initial encounter: Secondary | ICD-10-CM

## 2019-01-29 DIAGNOSIS — Z23 Encounter for immunization: Secondary | ICD-10-CM

## 2019-01-29 DIAGNOSIS — M62838 Other muscle spasm: Secondary | ICD-10-CM

## 2019-01-29 DIAGNOSIS — E119 Type 2 diabetes mellitus without complications: Secondary | ICD-10-CM

## 2019-01-29 DIAGNOSIS — S91331A Puncture wound without foreign body, right foot, initial encounter: Secondary | ICD-10-CM

## 2019-01-29 MED ORDER — TRAMADOL HCL 50 MG PO TABS: 50.0000 mg | ORAL_TABLET | Freq: Four times a day (QID) | ORAL | 0 refills | Status: DC | PRN

## 2019-01-29 MED ORDER — TIZANIDINE HCL 4 MG PO TABS: 4.0000 mg | ORAL_TABLET | Freq: Four times a day (QID) | ORAL | 0 refills | Status: DC | PRN

## 2019-01-29 MED ORDER — AMOXICILLIN-POT CLAVULANATE 875-125 MG PO TABS: 1.0000 | ORAL_TABLET | Freq: Two times a day (BID) | ORAL | 0 refills | Status: DC

## 2019-01-29 MED ORDER — TETANUS-DIPHTH-ACELL PERTUSSIS 5-2.5-18.5 LF-MCG/0.5 IM SUSP
INTRAMUSCULAR | Status: AC
  Filled 2019-01-29: qty 0.5

## 2019-01-29 MED ORDER — TETANUS-DIPHTH-ACELL PERTUSSIS 5-2.5-18.5 LF-MCG/0.5 IM SUSP
0.5000 mL | Freq: Once | INTRAMUSCULAR | Status: AC
  Administered 2019-01-29: 15:00:00 0.5 mL via INTRAMUSCULAR

## 2019-01-29 MED FILL — traMADol HCL 50 MG TABS: 50 | 3 days supply | Qty: 15 | Fill #0

## 2019-01-29 MED FILL — tiZANidine HCL 4 MG TABS: 4 | 2 days supply | Qty: 21 | Fill #0

## 2019-01-29 MED FILL — AMOX-CLAV 875-125 MG TABLET: 875-125 | 5 days supply | Qty: 10 | Fill #0

## 2019-01-29 NOTE — Discharge Instructions (Signed)
Take Augmentin antibiotic 2 times a day for 5 days.  This is to prevent a diabetic foot infection I am prescribing tramadol to take as needed for pain.  You will probably need to get this at an outside pharmacy. A prescription was also sent for tizanidine.  This is a mild muscle relaxer.  For the pain that is up in your neck and shoulder The puncture wound should gradually heal and not become less painful over the next few days See your PCP in follow-up

## 2019-01-29 NOTE — ED Provider Notes (Signed)
[place]    CSN: [id number] Arrival date & time: 01/29/19  1108      History   Chief Complaint Chief Complaint  Patient presents with  . Foot Pain    HPI Dylan Anderson is a 58 y.o. male.   HPI   The patient was out walking last night when he stepped on a board that had several nails sticking out of it.  His left foot was punctured by one of these nails. The pt was startled by the pain and ended up stepping on the board with his right foot afterward near his heel.  He was wearing shoes but does not recall how long the nails were. He reports a significant amount of pain in his left foot that he rates 7/10 on the pain scale. The pain on his right foot is comparatively mild and isn't bothering him as much. He believes that he needs a tetanus booster.  In addition, the pt reports pain in both of his shoulders. The pain in his right shoulder has been going on for quite some time and the pt states that his PCP wonders if he is developing arthritis. The pain in left shoulder began this morning and is made worse with movement of his left arm and with palpation of the chest wall. 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   Past Medical History:  Diagnosis Date  . Acute gout involving toe of left foot 09/21/2017  . Arthritis   . Back pain   . Chest pain in adult 05/26/2018  . Chronic cough 07/13/2017  . Claudication ([place])   . Diabetes mellitus type II, non insulin dependent ([place]) 07/10/2012  . Erectile dysfunction   . Gastroesophageal reflux disease 04/27/2017  . Hand pain 03/18/2017  . Healthcare maintenance 07/12/2017  . Hip pain 05/20/2017  . Hyperlipidemia 01/25/2017  . Hypertension 07/10/2012  . Insomnia 02/06/2017  . Low back pain 01/25/2017  . Peripheral artery disease ([place]) 06/07/2017  . PVD (peripheral vascular disease) ([place])   . Tobacco abuse 07/10/2012  . Type 2 diabetes mellitus [facility])     Patient Active Problem List   Diagnosis Date Noted  . Dizzy  09/21/2018  . Mild CAD 07/08/2018  . Abnormal cardiac CT angiography   . PVC's (premature ventricular contractions) 05/28/2018  . Chest pain in adult 05/26/2018  . Claudication ([place]) 11/05/2017  . Acute gout involving toe of left foot 09/21/2017  . Chronic cough 07/13/2017  . Healthcare maintenance 07/12/2017  . Peripheral artery disease ([place]) 06/07/2017  . Hip pain 05/20/2017  . Gastroesophageal reflux disease 04/27/2017  . Hand pain 03/18/2017  . Insomnia 02/06/2017  . Hyperlipidemia 01/25/2017  . Erectile dysfunction 01/25/2017  . Low back pain 01/25/2017  . Diabetes mellitus type II, non insulin dependent ([place]) 07/10/2012  . Hypertension 07/10/2012  . Tobacco abuse 07/10/2012    Past Surgical History:  Procedure Laterality Date  . ILIAC ARTERY STENT    . LEFT HEART CATH AND CORONARY ANGIOGRAPHY N/A 06/19/2018   Procedure: LEFT HEART CATH AND CORONARY ANGIOGRAPHY;  Surgeon: Troy Sine, MD;  Location: [facility];  Service: Cardiovascular;  Laterality: N/A;  . stent Left    left common and left internal illiac stent       Home Medications    Prior to Admission medications   Medication Sig Start Date End Date Taking? Authorizing Provider  amLODipine (NORVASC) 5 MG tablet Take 1 tablet (5 mg total) by mouth  daily. 12/05/18   Gildardo Pounds, NP  amoxicillin-clavulanate (AUGMENTIN) 875-125 MG tablet Take 1 tablet by mouth every 12 (twelve) hours. 01/29/19   Raylene Everts, MD  aspirin EC 81 MG tablet Take 1 tablet (81 mg total) by mouth daily. 05/28/18   Richardo Priest, MD  atorvastatin (LIPITOR) 80 MG tablet Take 1 tablet (80 mg total) by mouth daily at 6 PM. 12/05/18 03/05/19  Gildardo Pounds, NP  Blood Glucose Monitoring Suppl (TRUE METRIX METER) w/Device KIT Use as instructed. Check blood glucose level by fingerstick 3 times per day. 12/05/18   Gildardo Pounds, NP  buPROPion (WELLBUTRIN SR) 150 MG 12 hr tablet Take 1 tablet (150 mg total) by mouth 2 (two)  times daily. 01/16/19 02/15/19  Gildardo Pounds, NP  cilostazol (PLETAL) 100 MG tablet Take 1 tablet (100 mg total) by mouth 2 (two) times daily. 01/16/19 02/15/19  Gildardo Pounds, NP  empagliflozin (JARDIANCE) 25 MG TABS tablet Take 25 mg by mouth daily. 12/05/18 03/05/19  Gildardo Pounds, NP  fenofibrate (TRICOR) 48 MG tablet Take 1 tablet (48 mg total) by mouth daily. 12/05/18 03/05/19  Gildardo Pounds, NP  glucose blood (TRUE METRIX BLOOD GLUCOSE TEST) test strip Use as instructed 12/05/18   Gildardo Pounds, NP  insulin aspart (NOVOLOG) 100 UNIT/ML FlexPen Inject 5 Units into the skin 2 (two) times daily with a meal. 01/18/19 04/18/19  Gildardo Pounds, NP  Insulin Glargine (LANTUS) 100 UNIT/ML Solostar Pen Inject 25 units in skin morning and inject 25 units in skin at bedtime. 12/05/18   Gildardo Pounds, NP  Insulin Pen Needle (BD ULTRA-FINE PEN NEEDLES) 29G X 12.7MM MISC ICD10 E11.9 for use with insulin administration 12/05/18   Gildardo Pounds, NP  losartan (COZAAR) 100 MG tablet Take 1 tablet (100 mg total) by mouth daily. 01/16/19 04/16/19  Gildardo Pounds, NP  metFORMIN (GLUCOPHAGE) 500 MG tablet Take 2 tablets (1,000 mg total) by mouth 2 (two) times daily with a meal. 12/05/18   Gildardo Pounds, NP  metoprolol succinate (TOPROL-XL) 50 MG 24 hr tablet Take 1 tablet (50 mg total) by mouth at bedtime. 12/05/18 03/05/19  Gildardo Pounds, NP  naproxen sodium (ALEVE) 220 MG tablet Take 440 mg by mouth 2 (two) times daily as needed (pain).    [provider]  nitroGLYCERIN (NITROSTAT) 0.4 MG SL tablet Place 1 tablet (0.4 mg total) under the tongue every 5 (five) minutes as needed for chest pain. 08/16/18 11/14/18  Revankar, Reita Cliche, MD  pantoprazole (PROTONIX) 20 MG tablet Take 1 tablet (20 mg total) by mouth daily. 12/05/18 03/05/19  Gildardo Pounds, NP  tamsulosin (FLOMAX) 0.4 MG CAPS capsule Take 1 capsule (0.4 mg total) by mouth daily. 12/05/18 03/05/19  Gildardo Pounds, NP  tiZANidine (ZANAFLEX) 4  MG tablet Take 1-2 tablets (4-8 mg total) by mouth every 6 (six) hours as needed for muscle spasms. 01/29/19   Raylene Everts, MD  traMADol (ULTRAM) 50 MG tablet Take 1 tablet (50 mg total) by mouth every 6 (six) hours as needed. 01/29/19   Raylene Everts, MD  TRUEplus Lancets 28G MISC Use as instructed. Check blood glucose level by fingerstick 3 times per day. 12/05/18   Gildardo Pounds, NP    Family History Family History  Problem Relation Age of Onset  . Heart failure Mother   . Osteoarthritis Mother   . COPD Sister   . Stroke Sister   .  Heart attack Maternal Aunt   . Heart attack Maternal Uncle     Social History Social History   Tobacco Use  . Smoking status: Current Every Day Smoker    Packs/day: 0.50    Years: 17.00    Pack years: 8.50    Types: Cigarettes    Start date: 04/04/1978  . Smokeless tobacco: Never Used  . Tobacco comment: Max 1 ppd  Substance Use Topics  . Alcohol use: Yes    Comment: 2-3 mixed drinks 3 times per week  . Drug use: No     Allergies   Patient has no known allergies.   Review of Systems Review of Systems  Constitutional: Negative for chills and fever.  HENT: Negative for ear pain and sore throat.   Eyes: Negative for pain and visual disturbance.  Respiratory: Negative for cough and shortness of breath.   Cardiovascular: Negative for chest pain and palpitations.  Gastrointestinal: Negative for abdominal pain and vomiting.  Genitourinary: Negative for dysuria and hematuria.  Musculoskeletal: Positive for neck pain and neck stiffness. Negative for arthralgias and back pain.  Skin: Positive for wound. Negative for color change and rash.  Neurological: Negative for seizures and syncope.  All other systems reviewed and are negative.    Physical Exam Triage Vital Signs ED Triage Vitals  Enc Vitals Group     BP 01/29/19 1150 (!) 144/76     Pulse Rate 01/29/19 1150 68     Resp 01/29/19 1150 18     Temp 01/29/19 1150 98.3 F  (36.8 C)     Temp Source 01/29/19 1150 Oral     SpO2 01/29/19 1150 100 %     Weight 01/29/19 1151 150 lb (68 kg)     Height --      Head Circumference --      Peak Flow --      Pain Score 01/29/19 1151 7     Pain Loc --      Pain Edu? --      Excl. in [place]? --    No data found.  Updated Vital Signs BP (!) 144/76 (BP Location: Right Arm)   Pulse 68   Temp 98.3 F (36.8 C) (Oral)   Resp 18   Wt 68 kg   SpO2 100%   BMI 22.81 kg/m       Physical Exam Constitutional:      General: He is not in acute distress.    Appearance: He is well-developed.  HENT:     Head: Normocephalic and atraumatic.  Eyes:     Conjunctiva/sclera: Conjunctivae normal.     Pupils: Pupils are equal, round, and reactive to light.  Neck:     Musculoskeletal: Normal range of motion. Muscular tenderness present.     Comments: Tender in the right upper trapezius Cardiovascular:     Rate and Rhythm: Normal rate.  Pulmonary:     Effort: Pulmonary effort is normal. No respiratory distress.  Abdominal:     General: There is no distension.     Palpations: Abdomen is soft.  Musculoskeletal: Normal range of motion.       Feet:  Skin:    General: Skin is warm and dry.  Neurological:     Mental Status: He is alert.      UC Treatments / Results  Labs (all labs ordered are listed, but only abnormal results are displayed) Labs Reviewed - No data to display  EKG   Radiology Dg Foot Complete  Left  Result Date: 01/29/2019 CLINICAL DATA:  Stepped on a nail yesterday. EXAM: LEFT FOOT - COMPLETE 3+ VIEW COMPARISON:  None. FINDINGS: There is no evidence of fracture or dislocation. There is no evidence of arthropathy or other focal bone abnormality. Soft tissues are unremarkable. No radiopaque foreign body identified. IMPRESSION: Negative. Electronically Signed   By: Titus Dubin M.D.   On: 01/29/2019 14:06    Procedures Procedures (including critical care time)  Medications Ordered in  UC Medications  Tdap (BOOSTRIX) injection 0.5 mL (0.5 mLs Intramuscular Given 01/29/19 1441)  Tdap (BOOSTRIX) 5-2.5-18.5 LF-MCG/0.5 injection (has no administration in time range)    Initial Impression / Assessment and Plan / UC Course  I have reviewed the triage vital signs and the nursing notes.  Pertinent labs & imaging results that were available during my care of the patient were reviewed by me and considered in my medical decision making (see chart for details).      Final Clinical Impressions(s) / UC Diagnoses   Final diagnoses:  Puncture wound of plantar aspect of foot, unspecified laterality, initial encounter  Neck muscle spasm     Discharge Instructions     Take Augmentin antibiotic 2 times a day for 5 days.  This is to prevent a diabetic foot infection I am prescribing tramadol to take as needed for pain.  You will probably need to get this at an outside pharmacy. A prescription was also sent for tizanidine.  This is a mild muscle relaxer.  For the pain that is up in your neck and shoulder The puncture wound should gradually heal and not become less painful over the next few days See your PCP in follow-up    ED Prescriptions    Medication Sig Dispense Auth. Provider   tiZANidine (ZANAFLEX) 4 MG tablet Take 1-2 tablets (4-8 mg total) by mouth every 6 (six) hours as needed for muscle spasms. 21 tablet Raylene Everts, MD   amoxicillin-clavulanate (AUGMENTIN) 875-125 MG tablet Take 1 tablet by mouth every 12 (twelve) hours. 10 tablet Raylene Everts, MD   traMADol (ULTRAM) 50 MG tablet Take 1 tablet (50 mg total) by mouth every 6 (six) hours as needed. 15 tablet Raylene Everts, MD     I have reviewed the PDMP during this encounter.   Raylene Everts, MD 01/29/19 [phone]

## 2019-01-29 NOTE — ED Triage Notes (Signed)
Pt states he was walking his dog last night . Pt states he stepped on a borad with nails and puncture his feet. Pt states he has a pain coming from his back to his left shoulder this started this morning.

## 2019-02-05 MED FILL — PANTOPRAZOLE SOD DR 20 MG T: 20 | 30 days supply | Qty: 30 | Fill #2

## 2019-02-05 MED FILL — TAMSULOSIN HCL 0.4 MG CAP: 0.4 | 30 days supply | Qty: 30 | Fill #2

## 2019-02-05 MED FILL — ?METOPROLOL SUCC ER 50MG TA: 50 | 30 days supply | Qty: 30 | Fill #1

## 2019-02-05 MED FILL — FENOFIBRATE 48 MG TABLET: 48 | 30 days supply | Qty: 30 | Fill #2

## 2019-02-21 ENCOUNTER — Other Ambulatory Visit: Payer: Self-pay

## 2019-02-21 DIAGNOSIS — I779 Disorder of arteries and arterioles, unspecified: Secondary | ICD-10-CM

## 2019-02-25 ENCOUNTER — Ambulatory Visit (HOSPITAL_COMMUNITY)
Admission: RE | Admit: 2019-02-25 | Discharge: 2019-02-25 | Disposition: A | Discharge status: Home / Self Care | Payer: Self-pay | Source: Ambulatory Visit | Attending: Family | Admitting: Family

## 2019-02-25 ENCOUNTER — Ambulatory Visit (INDEPENDENT_AMBULATORY_CARE_PROVIDER_SITE_OTHER): Payer: Self-pay | Admitting: Family

## 2019-02-25 ENCOUNTER — Encounter: Payer: Self-pay | Admitting: Family

## 2019-02-25 ENCOUNTER — Other Ambulatory Visit: Payer: Self-pay

## 2019-02-25 ENCOUNTER — Telehealth: Payer: Self-pay | Admitting: *Deleted

## 2019-02-25 VITALS — BP 144/81 | HR 70 | Temp 98.0°F | Resp 16 | Ht 70.0 in | Wt 149.0 lb

## 2019-02-25 DIAGNOSIS — F17200 Nicotine dependence, unspecified, uncomplicated: Secondary | ICD-10-CM

## 2019-02-25 DIAGNOSIS — I779 Disorder of arteries and arterioles, unspecified: Secondary | ICD-10-CM

## 2019-02-25 DIAGNOSIS — Z95828 Presence of other vascular implants and grafts: Secondary | ICD-10-CM

## 2019-02-25 DIAGNOSIS — E1165 Type 2 diabetes mellitus with hyperglycemia: Secondary | ICD-10-CM

## 2019-02-25 DIAGNOSIS — E1151 Type 2 diabetes mellitus with diabetic peripheral angiopathy without gangrene: Secondary | ICD-10-CM

## 2019-02-25 DIAGNOSIS — IMO0002 Reserved for concepts with insufficient information to code with codable children: Secondary | ICD-10-CM

## 2019-02-25 NOTE — Patient Instructions (Signed)

## 2019-02-25 NOTE — Telephone Encounter (Signed)
Virtual Visit Pre-Appointment Phone Call  Today, I spoke with Trudi Ida and performed the following actions:  1. I explained that we are currently trying to limit exposure to the COVID-19 virus by seeing patients at home rather than in the office.  I explained that the visits are best done by video, but can be done by telephone.  I asked the patient if a virtual visit that the patient would like to try instead of coming into the office. Trudi Ida agreed to proceed with the virtual visit scheduled with Rosalita Chessman Nickel on 02/25/19.     2. I confirmed the BEST phone number to call the day of the visit and- I included this in appointment notes.  3. I asked if the patient had access to (through a family member/friend) a smartphone with video capability to be used for his visit?"  The patient said yes -   4. I confirmed consent by  a. sending through MyChart or by email the FULL LENGTH CONSENT FOR TELE-HEALTH VISIT as written at the end of this message or  b. verbally as listed below. i. This visit is being performed in the setting of COVID-19. ii. All virtual visits are billed to your insurance company just like a normal visit would be.   iii. We'd like you to understand that the technology does not allow for your provider to perform an examination, and thus may limit your provider's ability to fully assess your condition.  iv. If your provider identifies any concerns that need to be evaluated in person, we will make arrangements to do so.   v. Finally, though the technology is pretty good, we cannot assure that it will always work on either your or our end, and in the setting of a video visit, we may have to convert it to a phone-only visit.  In either situation, we cannot ensure that we have a secure connection.   vi. Are you willing to proceed?"  STAFF: Did the patient verbally acknowledge consent to telehealth visit? Document YES/NO here: YES  2. I advised the patient to be prepared -  I asked that the patient, on the day of his visit, record any information possible with the equipment at his home, such as blood pressure, pulse, oxygen saturation, and your weight and write them all down. I asked the patient to have a pen and paper handy nearby the day of the visit as well.  3. If the patient was scheduled for a video visit, I informed the patient that the visit with the doctor would start with a text to the smartphone # given to [place] by the patient.         If the patient was scheduled for a telephone call, I informed the patient that the visit with the doctor would start with a call to the telephone # given to [place] by the patient.  4. I Informed patient they will receive a phone call 15 minutes prior to their appointment time from a CMA or nurse to review medications, allergies, etc. to prepare for the visit.    TELEPHONE CALL NOTE  Sirius [place] has been deemed a candidate for a follow-up tele-health visit to limit community exposure during the Covid-19 pandemic. I spoke with the patient via phone to ensure availability of phone/video source, confirm preferred email & phone number, and discuss instructions and expectations.  I reminded Cobin Cadavid to be prepared with any vital sign and/or heart rhythm information that could  potentially be obtained via home monitoring, at the time of his visit. I reminded Hayk Divis to expect a phone call prior to his visit.  Cleaster Corin, NT 02/25/2019 3:22 PM     FULL LENGTH CONSENT FOR TELE-HEALTH VISIT   I hereby voluntarily request, consent and authorize [facility] and its employed or contracted physicians, physician assistants, nurse practitioners or other licensed health care professionals (the Practitioner), to provide me with telemedicine health care services (the "Services") as deemed necessary by the treating Practitioner. I acknowledge and consent to receive the Services by the Practitioner via telemedicine. I understand that  the telemedicine visit will involve communicating with the Practitioner through live audiovisual communication technology and the disclosure of certain medical information by electronic transmission. I acknowledge that I have been given the opportunity to request an in-person assessment or other available alternative prior to the telemedicine visit and am voluntarily participating in the telemedicine visit.  I understand that I have the right to withhold or withdraw my consent to the use of telemedicine in the course of my care at any time, without affecting my right to future care or treatment, and that the Practitioner or I may terminate the telemedicine visit at any time. I understand that I have the right to inspect all information obtained and/or recorded in the course of the telemedicine visit and may receive copies of available information for a reasonable fee.  I understand that some of the potential risks of receiving the Services via telemedicine include:  [name] Delay or interruption in medical evaluation due to technological equipment failure or disruption; . Information transmitted may not be sufficient (e.g. poor resolution of images) to allow for appropriate medical decision making by the Practitioner; and/or  . In rare instances, security protocols could fail, causing a breach of personal health information.  Furthermore, I acknowledge that it is my responsibility to provide information about my medical history, conditions and care that is complete and accurate to the best of my ability. I acknowledge that Practitioner's advice, recommendations, and/or decision may be based on factors not within their control, such as incomplete or inaccurate data provided by me or distortions of diagnostic images or specimens that may result from electronic transmissions. I understand that the practice of medicine is not an exact science and that Practitioner makes no warranties or guarantees regarding treatment  outcomes. I acknowledge that I will receive a copy of this consent concurrently upon execution via email to the email address I last provided but may also request a printed copy by calling the office of [facility].    I understand that my insurance will be billed for this visit.   I have read or had this consent read to me. . I understand the contents of this consent, which adequately explains the benefits and risks of the Services being provided via telemedicine.  . I have been provided ample opportunity to ask questions regarding this consent and the Services and have had my questions answered to my satisfaction. . I give my informed consent for the services to be provided through the use of telemedicine in my medical care  By participating in this telemedicine visit I agree to the above.

## 2019-02-25 NOTE — Progress Notes (Signed)
Virtual Visit via Telephone Note  I connected with Dylan Anderson on 02/25/2019 using the [employer] by telephone and verified that I was speaking with the correct person using two identifiers. Patient was located at his home and accompanied by his fiance'. I am located at the [facility] office/clinic.   The limitations of evaluation and management by telemedicine and the availability of in person appointments have been previously discussed with the patient and are documented in the patients chart. The patient expressed understanding and consented to proceed.  PCP: Scot Jun, FNP  Chief Complaint: Follow up peripheral artery occlusive disease   History of Present Illness: Dylan Anderson is a 58 y.o. male who previously had a left common iliac stent placed at [facility] several years ago. At that point he was only able to walk 200-300 feet before experiencing claudication symptoms. He states that after his iliac stent was placed he was able to walk further. He thinks now his walking distance has decreased somewhat again down to 3 to 400 feet. He states he also has pain in his calves when going up ladders and he works as a Theme park manager. He occasionally has numbness and tingling in his feet. He is a current smoker. He has tried to quit unsuccessfully in the past.   Other medical problems include COPD.[name] He has shortness of breath with minimal activity and a productive cough.  He also complains intermittently of buttock and thigh claudication with certain activities. He is on aspirin and statin.  Dr. Oneida Alar last evaluated pt on 08-03-17. At that time pt hadeasily palpable posterior tibial pulses bilaterally. His iliac stent was patent with mild in-stent restenosis. Difficult to know whether his symptoms are related to worsening peripheral arterial disease as his buttock and thigh symptoms certainly sound like buttock claudication. This may be due primarily to internal iliac artery occlusive  disease which is not treatable. However, we had discussions today regarding lifestyle changes such as a walking program of 30 minutes daily as well as stopping smoking. The patient was totry to walk 30 minutes daily. He was totry to quit smoking. He was toreturn for follow-up appointment and see our nurse practitioner in 3 months with exercise ABIs at that office visit. If these are abnormal we will consider intervention at that point. Otherwise conservative management forthe time being.   If he looks up for a minute or more his vision fades and the right side of his body feels out of control.  He reports numbness and loss of grip in both hands at times. He denies any known history of stroke or TIA.  He has numbness in both feet and lower legs.  He denies non healing wounds.   Diabetic: Yes, A1C was 8.3 on 01-16-19, was 7.9 in October 2018(review of records), uncontrolled, was diagnosed about age 42  Tobacco QXI:HWTUUE (1/2ppd, started in 1980)  Pt meds include: Statin :Yes Betablocker:No ASA:Yes Other anticoagulants/antiplatelets:no    Past Medical History:  Diagnosis Date  . Acute gout involving toe of left foot 09/21/2017  . Arthritis   . Back pain   . Chest pain in adult 05/26/2018  . Chronic cough 07/13/2017  . Claudication ([place])   . Diabetes mellitus type II, non insulin dependent ([place]) 07/10/2012  . Erectile dysfunction   . Gastroesophageal reflux disease 04/27/2017  . Hand pain 03/18/2017  . Healthcare maintenance 07/12/2017  . Hip pain 05/20/2017  . Hyperlipidemia 01/25/2017  . Hypertension 07/10/2012  . Insomnia 02/06/2017  . Low  back pain 01/25/2017  . Peripheral artery disease ([place]) 06/07/2017  . PVD (peripheral vascular disease) ([place])   . Tobacco abuse 07/10/2012  . Type 2 diabetes mellitus ([place])     Past Surgical History:  Procedure Laterality Date  . ILIAC ARTERY STENT    . LEFT HEART CATH AND CORONARY ANGIOGRAPHY N/A 06/19/2018   Procedure: LEFT  HEART CATH AND CORONARY ANGIOGRAPHY;  Surgeon: Troy Sine, MD;  Location: [place] [facility];  Service: Cardiovascular;  Laterality: N/A;  . stent Left    left common and left internal illiac stent    Current Meds  Medication Sig  . amLODipine (NORVASC) 5 MG tablet Take 1 tablet (5 mg total) by mouth daily.  [name] aspirin EC 81 MG tablet Take 1 tablet (81 mg total) by mouth daily.  [name] atorvastatin (LIPITOR) 80 MG tablet Take 1 tablet (80 mg total) by mouth daily at 6 PM.  . Blood Glucose Monitoring Suppl (TRUE METRIX METER) w/Device KIT Use as instructed. Check blood glucose level by fingerstick 3 times per day.  . empagliflozin (JARDIANCE) 25 MG TABS tablet Take 25 mg by mouth daily.  . fenofibrate (TRICOR) 48 MG tablet Take 1 tablet (48 mg total) by mouth daily.  [name] glucose blood (TRUE METRIX BLOOD GLUCOSE TEST) test strip Use as instructed  . insulin aspart (NOVOLOG) 100 UNIT/ML FlexPen Inject 5 Units into the skin 2 (two) times daily with a meal.  . Insulin Glargine (LANTUS) 100 UNIT/ML Solostar Pen Inject 25 units in skin morning and inject 25 units in skin at bedtime.  . Insulin Pen Needle (BD ULTRA-FINE PEN NEEDLES) 29G X 12.7MM MISC ICD10 E11.9 for use with insulin administration  . losartan (COZAAR) 100 MG tablet Take 1 tablet (100 mg total) by mouth daily.  . metFORMIN (GLUCOPHAGE) 500 MG tablet Take 2 tablets (1,000 mg total) by mouth 2 (two) times daily with a meal.  . metoprolol succinate (TOPROL-XL) 50 MG 24 hr tablet Take 1 tablet (50 mg total) by mouth at bedtime.  . naproxen sodium (ALEVE) 220 MG tablet Take 440 mg by mouth 2 (two) times daily as needed (pain).  . pantoprazole (PROTONIX) 20 MG tablet Take 1 tablet (20 mg total) by mouth daily.  . tamsulosin (FLOMAX) 0.4 MG CAPS capsule Take 1 capsule (0.4 mg total) by mouth daily.  . TRUEplus Lancets 28G MISC Use as instructed. Check blood glucose level by fingerstick 3 times per day.    12 system ROS was negative unless  otherwise noted in HPI   Observations/Objective:   Assessment and Plan: Dylan Anderson is a 58 y.o. male whopreviously had a left common iliac stent placed at [facility] several years ago.  He occasionally has bilateral hip pain with walking or not walking, does not seem to have claudication symptoms. His ABI's remain normal. Therefore his bilateral hip pain is not from lack or arterial perfusion.  Differential diagnoses would be osteoarthritis of hips and or lumbar spine, and DDD of the lumbar spine. He was evaluated by a spine specialist, states he is doing recommended exercises, but states this has not helped his pain.   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 He has no known history  of stroke or TIA.   He has a hx of rapid heart rate of unknown type, states he wore a monitor for a while, and something was found that he does not recall.   He has diabetic neuropathy in his feet, lower legs, and hands.   His atheroscleroticrisk factors include uncontrolled DM and smoking since 1980.  He takes a daily 81 mg ASA and a statin.    DATA  ABI Findings (02-25-19): +---------+------------------+-----+---------+--------+ Right    Rt Pressure (mmHg)IndexWaveform Comment  +---------+------------------+-----+---------+--------+ Brachial 145                                      +---------+------------------+-----+---------+--------+ PTA      163               1.07 triphasic         +---------+------------------+-----+---------+--------+ DP       166               1.08 triphasic         +---------+------------------+-----+---------+--------+ Great Toe100               0.65 Abnormal           +---------+------------------+-----+---------+--------+  +---------+------------------+-----+---------+-------+ Left     Lt Pressure (mmHg)IndexWaveform Comment +---------+------------------+-----+---------+-------+ Brachial 153                                     +---------+------------------+-----+---------+-------+ PTA      145               0.95 triphasic        +---------+------------------+-----+---------+-------+ DP       132               0.86 biphasic         +---------+------------------+-----+---------+-------+ Great Toe37                0.24 Abnormal         +---------+------------------+-----+---------+-------+  +-------+-----------+-----------+------------+------------+ ABI/TBIToday's ABIToday's TBIPrevious ABIPrevious TBI +-------+-----------+-----------+------------+------------+ Right  1.08       0.65       1.06        0.82         +-------+-----------+-----------+------------+------------+ Left   0.95       0.24       1.02        0.88         +-------+-----------+-----------+------------+------------+  Right ABIs appear essentially unchanged compared to prior study on 11/09/2017. Left ABIs appear decreased compared to prior study, while remaining within the same diagnositic category. Right and left TBI decreased compared to prior study on 11/09/2017.   Summary: Right: Resting right ankle-brachial index is within normal range. No evidence of significant right lower extremity arterial disease. The right toe-brachial index is abnormal.  Left: Resting left ankle-brachial index is within normal range. No evidence of significant left lower extremity arterial disease. The left toe-brachial index is abnormal.   Carotid Duplex (12-18-17): Bilateral ICA with 1-39% stenosis Bilateral vertebral artery flow is antegrade.  Bilateral subclavian artery waveforms are normal.     Follow Up Instructions:   Follow up  1 year with ABI's. I advised pt to notify us if he develops concerns re the circulation in his feet or legs.  He is attempting to quit smoking.  I discussed the assessment and treatment plan with the patient. The patient was provided an opportunity to ask questions and all were answered. The patient agreed with the plan and demonstrated an understanding of the instructions.   The patient was advised to call back or seek an in-person evaluation if the symptoms worsen or if the condition fails to improve as anticipated.  I spent 10 minutes with the patient via telephone encounter.   Gabrielle Dare [facility]el [facility] Office: [phone]  02/25/2019, 5:20 PM

## 2019-02-26 MED FILL — glipiZIDE 5 MG TABS: 5 | 30 days supply | Qty: 30 | Fill #1

## 2019-02-26 MED FILL — JARDIANCE 25 MG TABLET: 25 | 30 days supply | Qty: 30 | Fill #2

## 2019-02-26 MED FILL — ?AMLODIPINE BESYLATE 5 MG T: 5 MG | 30 days supply | Qty: 30 | Fill #1

## 2019-02-26 MED FILL — DULoxetine HCL 30 MG CPEP: 30 | 30 days supply | Qty: 30 | Fill #1

## 2019-03-18 NOTE — Progress Notes (Signed)
Cardiology Office Note:    Date:  03/19/2019   ID:  Dylan Anderson, DOB 1960-05-01, MRN [id number]  PCP:  Scot Jun, FNP  Cardiologist:  Shirlee More, MD    Referring MD: Imagene Riches, NP    ASSESSMENT:    1. Mild CAD   2. Essential hypertension   3. Mixed hyperlipidemia   4. PVC's (premature ventricular contractions)    PLAN:    In order of problems listed above:  1. Stable CAD continue medical treatment including aspirin high intensity statin and beta-blocker.  Rarely uses nitroglycerin has no exertional angina.  New York Heart Association class I to class II 2. Stable hypertension continue treatment including ARB with diabetes 3. To new current treatment statin fenofibrate if LDL greater than 70 we will stop his fenofibrate and add Zetia. 4. Stable asymptomatic continue beta-blocker 5. Able diabetes A1c continues above age he is committed to diabetic control and is on both insulin and oral agent with Jardiance and Metformin.   Next appointment: 6 months   Medication Adjustments/Labs and Tests Ordered: Current medicines are reviewed at length with the patient today.  Concerns regarding medicines are outlined above.  No orders of the defined types were placed in this encounter.  No orders of the defined types were placed in this encounter.   Chief Complaint  Patient presents with  . Follow-up  . Coronary Artery Disease    History of Present Illness:    Dylan Anderson is a 58 y.o. male with a hx of CAD, HTN, DLD, PVC"s. Left heart  cath 06/19/18 showed mild diffuse CAD with focal 80% ostial proximal narrowing of the ramus branch which was small in caliber  And he was treated medically  He was last seen 09/21/2018. Compliance with diet, lifestyle and medications: Yes but he was off his statin for about a month when his lipids were drawn  Rarely he takes nitroglycerin.  Last was about a month ago always quickly relieved he has no typical pattern of exertional  angina New York Heart Association class I class II.  He has nausea related to Metformin that occurs intermittently no edema shortness of breath palpitation or syncope.  We will recheck his lipid profile today with elevated LPA the goal is to have an LDL less than 70 he may require addition of Zetia.   Ref Range & Units 5 mo ago  Lipoprotein (a) <75.0 nmol/L 51.6     Past Medical History:  Diagnosis Date  . Acute gout involving toe of left foot 09/21/2017  . Arthritis   . Back pain   . Chest pain in adult 05/26/2018  . Chronic cough 07/13/2017  . Claudication ([place])   . Diabetes mellitus type II, non insulin dependent ([place]) 07/10/2012  . Erectile dysfunction   . Gastroesophageal reflux disease 04/27/2017  . Hand pain 03/18/2017  . Healthcare maintenance 07/12/2017  . Hip pain 05/20/2017  . Hyperlipidemia 01/25/2017  . Hypertension 07/10/2012  . Insomnia 02/06/2017  . Low back pain 01/25/2017  . Peripheral artery disease ([place]) 06/07/2017  . PVD (peripheral vascular disease) ([place])   . Tobacco abuse 07/10/2012  . Type 2 diabetes mellitus ([place])     Past Surgical History:  Procedure Laterality Date  . ILIAC ARTERY STENT    . LEFT HEART CATH AND CORONARY ANGIOGRAPHY N/A 06/19/2018   Procedure: LEFT HEART CATH AND CORONARY ANGIOGRAPHY;  Surgeon: Troy Sine, MD;  Location: [facility];  Service: Cardiovascular;  Laterality: N/A;  .  stent Left    left common and left internal illiac stent    Current Medications: Current Meds  Medication Sig  . amLODipine (NORVASC) 5 MG tablet Take 1 tablet (5 mg total) by mouth daily.  [name] aspirin EC 81 MG tablet Take 1 tablet (81 mg total) by mouth daily.  [name] atorvastatin (LIPITOR) 80 MG tablet Take 1 tablet (80 mg total) by mouth daily at 6 PM.  . Blood Glucose Monitoring Suppl (TRUE METRIX METER) w/Device KIT Use as instructed. Check blood glucose level by fingerstick 3 times per day.  [name] buPROPion (WELLBUTRIN SR) 150 MG 12 hr tablet Take 1 tablet (150  mg total) by mouth 2 (two) times daily.  . fenofibrate (TRICOR) 48 MG tablet Take 1 tablet (48 mg total) by mouth daily.  [name] glucose blood (TRUE METRIX BLOOD GLUCOSE TEST) test strip Use as instructed  . insulin aspart (NOVOLOG) 100 UNIT/ML FlexPen Inject 5 Units into the skin 2 (two) times daily with a meal.  . Insulin Glargine (LANTUS) 100 UNIT/ML Solostar Pen Inject 25 units in skin morning and inject 25 units in skin at bedtime.  . Insulin Pen Needle (BD ULTRA-FINE PEN NEEDLES) 29G X 12.7MM MISC ICD10 E11.9 for use with insulin administration  . losartan (COZAAR) 100 MG tablet Take 1 tablet (100 mg total) by mouth daily.  . metFORMIN (GLUCOPHAGE) 500 MG tablet Take 2 tablets (1,000 mg total) by mouth 2 (two) times daily with a meal.  . metoprolol succinate (TOPROL-XL) 50 MG 24 hr tablet Take 1 tablet (50 mg total) by mouth at bedtime.  . naproxen sodium (ALEVE) 220 MG tablet Take 440 mg by mouth 2 (two) times daily as needed (pain).  . nitroGLYCERIN (NITROSTAT) 0.4 MG SL tablet Place 1 tablet (0.4 mg total) under the tongue every 5 (five) minutes as needed for chest pain.  . pantoprazole (PROTONIX) 20 MG tablet Take 1 tablet (20 mg total) by mouth daily.  . TRUEplus Lancets 28G MISC Use as instructed. Check blood glucose level by fingerstick 3 times per day.     Allergies:   Patient has no known allergies.   Social History   Socioeconomic History  . Marital status: Legally Separated    Spouse name: Not on file  . Number of children: Not on file  . Years of education: Not on file  . Highest education level: Not on file  Occupational History  . Occupation: unemployed  Tobacco Use  . Smoking status: Current Every Day Smoker    Packs/day: 0.50    Years: 17.00    Pack years: 8.50    Types: Cigarettes    Start date: 04/04/1978  . Smokeless tobacco: Never Used  . Tobacco comment: Max 1 ppd  Substance and Sexual Activity  . Alcohol use: Yes    Comment: 2-3 mixed drinks 3 times per week   . Drug use: No  . Sexual activity: Yes    Partners: Female  Other Topics Concern  . Not on file  Social History Narrative  . Not on file   Social Determinants of Health   Financial Resource Strain:   . Difficulty of Paying Living Expenses: Not on file  Food Insecurity:   . Worried About Charity fundraiser in the Last Year: Not on file  . Ran Out of Food in the Last Year: Not on file  Transportation Needs:   . Lack of Transportation (Medical): Not on file  . Lack of Transportation (Non-Medical): Not on file  Physical Activity:   . Days of Exercise per Week: Not on file  . Minutes of Exercise per Session: Not on file  Stress:   . Feeling of Stress : Not on file  Social Connections:   . Frequency of Communication with Friends and Family: Not on file  . Frequency of Social Gatherings with Friends and Family: Not on file  . Attends Religious Services: Not on file  . Active Member of Clubs or Organizations: Not on file  . Attends Archivist Meetings: Not on file  . Marital Status: Not on file     Family History: The patient's family history includes COPD in his sister; Heart attack in his maternal aunt and maternal uncle; Heart failure in his mother; Osteoarthritis in his mother; Stroke in his sister. ROS:   Please see the history of present illness.    All other systems reviewed and are negative.  EKGs/Labs/Other Studies Reviewed:    The following studies were reviewed today:  EKG:  EKG 09/21/2018 independently reviewed sinus rhythm incomplete right bundle branch block  Recent Labs: 06/03/2018: B Natriuretic Peptide 16.1 08/16/2018: Hemoglobin 13.2; Platelets 332; TSH 0.494 09/21/2018: ALT 18 01/16/2019: BUN 17; Creatinine, Ser 1.12; Potassium 5.0; Sodium 142  Recent Lipid Panel    Component Value Date/Time   CHOL 215 (H) 01/16/2019 1135   TRIG 178 (H) 01/16/2019 1135   HDL 46 01/16/2019 1135   CHOLHDL 4.7 01/16/2019 1135   CHOLHDL 5.7 07/10/2012 1301    VLDL 36 07/10/2012 1301   LDLCALC 137 (H) 01/16/2019 1135    Physical Exam:    VS:  BP 120/62 (BP Location: Right Arm, Patient Position: Sitting, Cuff Size: Normal)   Pulse 78   Ht '5\' 10"'  (1.778 m)   Wt 150 lb (68 kg)   SpO2 97%   BMI 21.52 kg/m     Wt Readings from Last 3 Encounters:  03/19/19 150 lb (68 kg)  02/25/19 149 lb (67.6 kg)  01/29/19 150 lb (68 kg)     GEN:  Well nourished, well developed in no acute distress HEENT: Normal NECK: No JVD; No carotid bruits LYMPHATICS: No lymphadenopathy CARDIAC: RRR, no murmurs, rubs, gallops RESPIRATORY:  Clear to auscultation without rales, wheezing or rhonchi  ABDOMEN: Soft, non-tender, non-distended MUSCULOSKELETAL:  No edema; No deformity  SKIN: Warm and dry NEUROLOGIC:  Alert and oriented x 3 PSYCHIATRIC:  Normal affect    Signed, Shirlee More, MD  03/19/2019 8:50 AM    [place]

## 2019-03-19 ENCOUNTER — Other Ambulatory Visit: Payer: Self-pay

## 2019-03-19 ENCOUNTER — Ambulatory Visit (INDEPENDENT_AMBULATORY_CARE_PROVIDER_SITE_OTHER): Payer: Self-pay | Admitting: Cardiology

## 2019-03-19 ENCOUNTER — Encounter: Payer: Self-pay | Admitting: Cardiology

## 2019-03-19 VITALS — BP 120/62 | HR 78 | Ht 70.0 in | Wt 150.0 lb

## 2019-03-19 DIAGNOSIS — I493 Ventricular premature depolarization: Secondary | ICD-10-CM

## 2019-03-19 DIAGNOSIS — E782 Mixed hyperlipidemia: Secondary | ICD-10-CM

## 2019-03-19 DIAGNOSIS — I779 Disorder of arteries and arterioles, unspecified: Secondary | ICD-10-CM

## 2019-03-19 DIAGNOSIS — I10 Essential (primary) hypertension: Secondary | ICD-10-CM

## 2019-03-19 DIAGNOSIS — I2510 Atherosclerotic heart disease of native coronary artery without angina pectoris: Secondary | ICD-10-CM

## 2019-03-19 NOTE — Patient Instructions (Signed)
Medication Instructions:  Your physician recommends that you continue on your current medications as directed. Please refer to the Current Medication list given to you today.  *If you need a refill on your cardiac medications before your next appointment, please call your pharmacy*  Lab Work: Your physician recommends that you return for lab work today: lipid panel.   If you have labs (blood work) drawn today and your tests are completely normal, you will receive your results only by: [name] MyChart Message (if you have MyChart) OR . A paper copy in the mail If you have any lab test that is abnormal or we need to change your treatment, we will call you to review the results.  Testing/Procedures: None  Follow-Up: At [facility], you and your health needs are our priority.  As part of our continuing mission to provide you with exceptional heart care, we have created designated Provider Care Teams.  These Care Teams include your primary Cardiologist (physician) and Advanced Practice Providers (APPs -  Physician Assistants and Nurse Practitioners) who all work together to provide you with the care you need, when you need it.  Your next appointment:   6 month(s)  The format for your next appointment:   In Person  Provider:   Shirlee More, MD

## 2019-03-20 ENCOUNTER — Telehealth: Payer: Self-pay | Admitting: *Deleted

## 2019-03-20 DIAGNOSIS — E785 Hyperlipidemia, unspecified: Secondary | ICD-10-CM

## 2019-03-20 LAB — LIPID PANEL
Chol/HDL Ratio: 6.8 ratio — ABNORMAL HIGH (ref 0.0–5.0)
Cholesterol, Total: 280 mg/dL — ABNORMAL HIGH (ref 100–199)
HDL: 41 mg/dL (ref >39)
LDL Chol Calc (NIH): 186 mg/dL — ABNORMAL HIGH (ref 0–99)
Triglycerides: 275 mg/dL — ABNORMAL HIGH (ref 0–149)
VLDL Cholesterol Cal: 53 mg/dL — ABNORMAL HIGH (ref 5–40)

## 2019-03-20 MED ORDER — REPATHA SURECLICK 140 MG/ML ~~LOC~~ SOAJ: 140.0000 mg | SUBCUTANEOUS | Status: DC

## 2019-03-20 NOTE — Telephone Encounter (Signed)
Patient informed of results and advised that Dr. Bettina Gavia recommends a lipid clinic referral for new start of repatha. Patient is agreeable to plan and confirmed that he has been taking atorvastatin 80 mg daily. Referral has been placed. Advised patient he will be contacted to schedule an appointment. He verbalized understanding. No further questions.

## 2019-03-20 NOTE — Telephone Encounter (Signed)
-----   Message from Richardo Priest, MD sent at 03/20/2019  7:43 AM EST ----- Normal or stable result  His LDL cholesterol remains extremely elevated despite taking atorvastatin, referral to lipid clinicHe will need coincident PCSK9 therapy.

## 2019-03-26 ENCOUNTER — Telehealth: Payer: Self-pay | Admitting: *Deleted

## 2019-03-26 ENCOUNTER — Ambulatory Visit: Payer: Self-pay | Admitting: Cardiology

## 2019-03-26 NOTE — Telephone Encounter (Signed)
A message was left,re:new appointment with the Pharm D.

## 2019-04-16 ENCOUNTER — Telehealth: Payer: Self-pay

## 2019-04-16 NOTE — Telephone Encounter (Signed)
LMOM TO RESCHEDULE LIPID PHARMD APPT

## 2019-04-16 NOTE — Telephone Encounter (Signed)
-----   Message from [place], A [place] sent at 04/16/2019  4:48 PM EST ----- Please call this patient and offer appt on January 28 instead of feb/2  Thanks

## 2019-04-24 ENCOUNTER — Ambulatory Visit (INDEPENDENT_AMBULATORY_CARE_PROVIDER_SITE_OTHER): Payer: Self-pay | Admitting: Nurse Practitioner

## 2019-04-24 DIAGNOSIS — K219 Gastro-esophageal reflux disease without esophagitis: Secondary | ICD-10-CM

## 2019-04-24 DIAGNOSIS — E1165 Type 2 diabetes mellitus with hyperglycemia: Secondary | ICD-10-CM

## 2019-04-24 DIAGNOSIS — E785 Hyperlipidemia, unspecified: Secondary | ICD-10-CM

## 2019-04-24 DIAGNOSIS — I739 Peripheral vascular disease, unspecified: Secondary | ICD-10-CM

## 2019-04-24 DIAGNOSIS — F331 Major depressive disorder, recurrent, moderate: Secondary | ICD-10-CM

## 2019-04-24 DIAGNOSIS — IMO0002 Reserved for concepts with insufficient information to code with codable children: Secondary | ICD-10-CM

## 2019-04-24 DIAGNOSIS — E1142 Type 2 diabetes mellitus with diabetic polyneuropathy: Secondary | ICD-10-CM

## 2019-04-24 DIAGNOSIS — I10 Essential (primary) hypertension: Secondary | ICD-10-CM

## 2019-04-24 MED ORDER — METFORMIN HCL 500 MG PO TABS: 1000.0000 mg | ORAL_TABLET | Freq: Two times a day (BID) | ORAL | 1 refills | Status: DC

## 2019-04-24 MED ORDER — TRUEPLUS LANCETS 28G MISC: 6 refills | Status: AC

## 2019-04-24 MED ORDER — METOPROLOL SUCCINATE ER 50 MG PO TB24: 50.0000 mg | ORAL_TABLET | Freq: Every day | ORAL | 1 refills | Status: DC

## 2019-04-24 MED ORDER — PANTOPRAZOLE SODIUM 20 MG PO TBEC: 20.0000 mg | DELAYED_RELEASE_TABLET | Freq: Every day | ORAL | 1 refills | Status: DC

## 2019-04-24 MED ORDER — ASPIRIN EC 81 MG PO TBEC: 81.0000 mg | DELAYED_RELEASE_TABLET | Freq: Every day | ORAL | 3 refills | Status: DC

## 2019-04-24 MED ORDER — BD PEN NEEDLE ORIGINAL U/F 29G X 12.7MM MISC: 2 refills | Status: DC

## 2019-04-24 MED ORDER — FENOFIBRATE 48 MG PO TABS: 48.0000 mg | ORAL_TABLET | Freq: Every day | ORAL | 2 refills | Status: DC

## 2019-04-24 MED ORDER — TRUE METRIX BLOOD GLUCOSE TEST VI STRP: ORAL_STRIP | 6 refills | Status: AC

## 2019-04-24 MED ORDER — GABAPENTIN 300 MG PO CAPS: 600.0000 mg | ORAL_CAPSULE | Freq: Three times a day (TID) | ORAL | 2 refills | Status: DC

## 2019-04-24 MED ORDER — INSULIN GLARGINE 100 UNIT/ML SOLOSTAR PEN: PEN_INJECTOR | SUBCUTANEOUS | 11 refills | Status: DC

## 2019-04-24 MED ORDER — LOSARTAN POTASSIUM 100 MG PO TABS: 100.0000 mg | ORAL_TABLET | Freq: Every day | ORAL | 1 refills | Status: DC

## 2019-04-24 MED ORDER — INSULIN ASPART 100 UNIT/ML FLEXPEN: 10.0000 [IU] | PEN_INJECTOR | Freq: Two times a day (BID) | SUBCUTANEOUS | 6 refills | Status: DC

## 2019-04-24 MED ORDER — AMLODIPINE BESYLATE 5 MG PO TABS: 5.0000 mg | ORAL_TABLET | Freq: Every day | ORAL | 1 refills | Status: DC

## 2019-04-24 MED ORDER — BUPROPION HCL ER (SR) 150 MG PO TB12: 150.0000 mg | ORAL_TABLET | Freq: Two times a day (BID) | ORAL | 2 refills | Status: DC

## 2019-04-24 MED ORDER — ATORVASTATIN CALCIUM 80 MG PO TABS: 80.0000 mg | ORAL_TABLET | Freq: Every day | ORAL | 2 refills | Status: DC

## 2019-04-24 MED FILL — FENOFIBRATE 48 MG TABLET: 48 | 30 days supply | Qty: 30 | Fill #0

## 2019-04-24 MED FILL — GABAPENTIN 300 MG CAPSULE: 300 | 30 days supply | Qty: 180 | Fill #0

## 2019-04-24 MED FILL — BUPROPION SR 150 MG TABLET: 150 | 30 days supply | Qty: 60 | Fill #0

## 2019-04-24 MED FILL — TRUE METRIX TEST STRIP: 30 days supply | Qty: 100 | Fill #0

## 2019-04-24 MED FILL — ?BASAGLAR 100 UNITS/ML KWPE: 100 | 30 days supply | Qty: 15 | Fill #0

## 2019-04-24 MED FILL — TRUEplus LANCETS 28G MISC: 30 days supply | Qty: 100 | Fill #0

## 2019-04-24 MED FILL — !NOVOLOG FLEXPEN SYRINGE 1: 100/ML | 30 days supply | Qty: 6 | Fill #0

## 2019-04-24 MED FILL — AMLODIPINE BESYLATE 5 MG TA: 5 | 30 days supply | Qty: 30 | Fill #0

## 2019-04-24 MED FILL — ATORVASTATIN 80 MG TABLET: 80 | 30 days supply | Qty: 30 | Fill #0

## 2019-04-24 MED FILL — TRUEPLUS PEN NDL 31GX5/16: 31G X 8 MM | 25 days supply | Qty: 100 | Fill #0

## 2019-04-24 MED FILL — ?METOPROLOL SUCC ER 50MG TA: 50 | 30 days supply | Qty: 30 | Fill #0

## 2019-04-24 MED FILL — ?METFORMIN HCL 500MG TABLET: 500 | 30 days supply | Qty: 120 | Fill #0

## 2019-04-24 MED FILL — LOSARTAN POTASSIUM 100 MG T: 100 | 30 days supply | Qty: 30 | Fill #0

## 2019-04-24 MED FILL — PANTOPRAZOLE SOD DR 20 MG T: 20 | 30 days supply | Qty: 30 | Fill #0

## 2019-04-24 NOTE — Progress Notes (Signed)
Virtual Visit via Telephone Note Due to national recommendations of social distancing due to [place] 19, telehealth visit is felt to be most appropriate for this patient at this time.  I discussed the limitations, risks, security and privacy concerns of performing an evaluation and management service by telephone and the availability of in person appointments. I also discussed with the patient that there may be a patient responsible charge related to this service. The patient expressed understanding and agreed to proceed.    I connected with Dylan Anderson on 04/24/19  at   2:30 PM EST  EDT by telephone and verified that I am speaking with the correct person using two identifiers.   Consent I discussed the limitations, risks, security and privacy concerns of performing an evaluation and management service by telephone and the availability of in person appointments. I also discussed with the patient that there may be a patient responsible charge related to this service. The patient expressed understanding and agreed to proceed.   Location of Patient: Private  Residence    Location of Provider: [facility] [facility] [place] participating in Telemedicine visit: Dylan Anderson    History of Present Illness: Telemedicine visit for: F/U   Essential Hypertension Well controlled. He does not monitor his blood pressure at home. Currently taking amlodipine 5 mg daily, Toprol-XL 50 mg daily and losartan 100 mg daily as prescribed. Denies chest pain,  Palpitations, headaches or BLE edema.  BP Readings from Last 3 Encounters:  03/19/19 120/62  02/25/19 (!) 144/81  01/29/19 (!) 144/76    DM TYPE 2 Not well controlled or at goal of less than 7.  Blood glucose levels averaging 150-300s.  Does not routinely monitor postprandial blood glucose levels.  I have instructed him to begin monitoring postprandial and fasting levels.  Hyperglycemic symptoms  include diabetic peripheral neuropathy which is currently not controlled.  He is requesting to stop Cymbalta and restart gabapentin at an increased dose.  We will restart at 600 mg 3 times daily increase from 300 mg 3 times daily.[name]  He denies any symptoms of hypoglycemia.  Current medications include Metformin 1000 mg twice daily, NovoLog 10 units twice daily and Lantus 25 units twice daily.  He is taking STATIN and ARB.  Overdue for eye exam. Patient has been advised to apply for financial assistance and schedule to see our financial counselor.  Lab Results  Component Value Date   HGBA1C 8.3 (H) 01/16/2019   Dyslipidemia LDL is not at goal of less than 70.  Endorses medication compliance taking atorvastatin 80 mg daily and denies any statin intolerance or myalgias.  Also taking TriCor 48 mg daily as prescribed.  He is not diet adherent. Lab Results  Component Value Date   LDLCALC 186 (H) 03/19/2019    Past Medical History:  Diagnosis Date  . Acute gout involving toe of left foot 09/21/2017  . Arthritis   . Back pain   . Chest pain in adult 05/26/2018  . Chronic cough 07/13/2017  . Claudication ([place])   . Diabetes mellitus type II, non insulin dependent ([place]) 07/10/2012  . Erectile dysfunction   . Gastroesophageal reflux disease 04/27/2017  . Hand pain 03/18/2017  . Healthcare maintenance 07/12/2017  . Hip pain 05/20/2017  . Hyperlipidemia 01/25/2017  . Hypertension 07/10/2012  . Insomnia 02/06/2017  . Low back pain 01/25/2017  . Peripheral artery disease ([place]) 06/07/2017  . PVD (peripheral vascular disease) ([place])   .  Tobacco abuse 07/10/2012  . Type 2 diabetes mellitus ([place])     Past Surgical History:  Procedure Laterality Date  . ILIAC ARTERY STENT    . LEFT HEART CATH AND CORONARY ANGIOGRAPHY N/A 06/19/2018   Procedure: LEFT HEART CATH AND CORONARY ANGIOGRAPHY;  Surgeon: Troy Sine, MD;  Location: [facility];  Service: Cardiovascular;  Laterality: N/A;  . stent Left    left  common and left internal illiac stent    Family History  Problem Relation Age of Onset  . Heart failure Mother   . Osteoarthritis Mother   . COPD Sister   . Stroke Sister   . Heart attack Maternal Aunt   . Heart attack Maternal Uncle     Social History   Socioeconomic History  . Marital status: Legally Separated    Spouse name: Not on file  . Number of children: Not on file  . Years of education: Not on file  . Highest education level: Not on file  Occupational History  . Occupation: unemployed  Tobacco Use  . Smoking status: Current Every Day Smoker    Packs/day: 0.50    Years: 17.00    Pack years: 8.50    Types: Cigarettes    Start date: 04/04/1978  . Smokeless tobacco: Never Used  . Tobacco comment: Max 1 ppd  Substance and Sexual Activity  . Alcohol use: Yes    Comment: 2-3 mixed drinks 3 times per week  . Drug use: No  . Sexual activity: Yes    Partners: Female  Other Topics Concern  . Not on file  Social History Narrative  . Not on file   Social Determinants of Health   Financial Resource Strain:   . Difficulty of Paying Living Expenses: Not on file  Food Insecurity:   . Worried About Charity fundraiser in the Last Year: Not on file  . Ran Out of Food in the Last Year: Not on file  Transportation Needs:   . Lack of Transportation (Medical): Not on file  . Lack of Transportation (Non-Medical): Not on file  Physical Activity:   . Days of Exercise per Week: Not on file  . Minutes of Exercise per Session: Not on file  Stress:   . Feeling of Stress : Not on file  Social Connections:   . Frequency of Communication with Friends and Family: Not on file  . Frequency of Social Gatherings with Friends and Family: Not on file  . Attends Religious Services: Not on file  . Active Member of Clubs or Organizations: Not on file  . Attends Archivist Meetings: Not on file  . Marital Status: Not on file     Observations/Objective: Awake, alert and  oriented x 3   Review of Systems  Constitutional: Negative for fever, malaise/fatigue and weight loss.  HENT: Negative.  Negative for nosebleeds.   Eyes: Negative.  Negative for blurred vision, double vision and photophobia.  Respiratory: Positive for shortness of breath. Negative for cough.   Cardiovascular: Negative.  Negative for chest pain, palpitations and leg swelling.  Gastrointestinal: Negative.  Negative for heartburn, nausea and vomiting.  Musculoskeletal: Negative.  Negative for myalgias.  Neurological: Positive for sensory change. Negative for dizziness, focal weakness, seizures and headaches.  Psychiatric/Behavioral: Negative.  Negative for suicidal ideas.    Assessment and Plan: Ali was seen today for diabetes, hypertension and hyperlipidemia.  Diagnoses and all orders for this visit:  DM (diabetes mellitus), type 2, uncontrolled  with complications ([place]) -     Hemoglobin A1c; Future -     Microalbumin / creatinine urine ratio; Future -     glucose blood (TRUE METRIX BLOOD GLUCOSE TEST) test strip; Use as instructed. Check blood glucose level by fingerstick three times per day. E11.65 -     insulin aspart (NOVOLOG) 100 UNIT/ML FlexPen; Inject 10 Units into the skin 2 (two) times daily with a meal. -     Insulin Glargine (LANTUS) 100 UNIT/ML Solostar Pen; Inject 25 units in skin morning and inject 25 units in skin at bedtime. -     Insulin Pen Needle (BD ULTRA-FINE PEN NEEDLES) 29G X 12.7MM MISC; ICD10 E11.9 for use with insulin administration -     metFORMIN (GLUCOPHAGE) 500 MG tablet; Take 2 tablets (1,000 mg total) by mouth 2 (two) times daily with a meal. -     TRUEplus Lancets 28G MISC; Use as instructed. Check blood glucose level by fingerstick 3 times per day. E11.65 Continue blood sugar control as discussed in office today, low carbohydrate diet, and regular physical exercise as tolerated, 150 minutes per week (30 min each day, 5 days per week, or 50 min 3 days per  week). Keep blood sugar logs with fasting goal of 90-130 mg/dl, post prandial (after you eat) less than 180.  For Hypoglycemia: BS <60 and Hyperglycemia BS >400; contact the clinic ASAP. Annual eye exams and foot exams are recommended.   Diabetic polyneuropathy associated with type 2 diabetes mellitus (HCC) -     gabapentin (NEURONTIN) 300 MG capsule; Take 2 capsules (600 mg total) by mouth 3 (three) times daily.   Essential hypertension -     CMP14+EGFR; Future -     amLODipine (NORVASC) 5 MG tablet; Take 1 tablet (5 mg total) by mouth daily. -     losartan (COZAAR) 100 MG tablet; Take 1 tablet (100 mg total) by mouth daily. -     metoprolol succinate (TOPROL-XL) 50 MG 24 hr tablet; Take 1 tablet (50 mg total) by mouth at bedtime. Continue all antihypertensives as prescribed.  Remember to bring in your blood pressure log with you for your follow up appointment.  DASH/Mediterranean Diets are healthier choices for HTN.    Dyslipidemia, goal LDL below 70 -     atorvastatin (LIPITOR) 80 MG tablet; Take 1 tablet (80 mg total) by mouth daily at 6 PM. -     fenofibrate (TRICOR) 48 MG tablet; Take 1 tablet (48 mg total) by mouth daily.  Moderate episode of recurrent major depressive disorder (HCC) -     buPROPion (WELLBUTRIN SR) 150 MG 12 hr tablet; Take 1 tablet (150 mg total) by mouth 2 (two) times daily.  GERD without esophagitis -     pantoprazole (PROTONIX) 20 MG tablet; Take 1 tablet (20 mg total) by mouth daily. INSTRUCTIONS: Avoid GERD Triggers: acidic, spicy or fried foods, caffeine, coffee, sodas,  alcohol and chocolate.  INSTRUCTIONS: Avoid GERD Triggers: acidic, spicy or fried foods, caffeine, coffee, sodas,  alcohol and chocolate.   Peripheral artery disease (HCC) -     aspirin EC 81 MG tablet; Take 1 tablet (81 mg total) by mouth daily.     Follow Up Instructions No follow-ups on file.     I discussed the assessment and treatment plan with the patient. The patient was  provided an opportunity to ask questions and all were answered. The patient agreed with the plan and demonstrated an understanding of the instructions.  The patient was advised to call back or seek an in-person evaluation if the symptoms worsen or if the condition fails to improve as anticipated.  I provided  minutes of non-face-to-face time during this encounter including median intraservice time, reviewing previous notes, labs, imaging, medications and explaining diagnosis and management.  Dylan Pounds, FNP-BC

## 2019-04-29 ENCOUNTER — Ambulatory Visit: Payer: Self-pay

## 2019-04-29 ENCOUNTER — Other Ambulatory Visit: Payer: Self-pay

## 2019-04-29 DIAGNOSIS — I10 Essential (primary) hypertension: Secondary | ICD-10-CM

## 2019-04-29 DIAGNOSIS — E1165 Type 2 diabetes mellitus with hyperglycemia: Secondary | ICD-10-CM

## 2019-04-29 DIAGNOSIS — IMO0002 Reserved for concepts with insufficient information to code with codable children: Secondary | ICD-10-CM

## 2019-04-29 NOTE — Progress Notes (Signed)
Patient here for labs ordered during recent televisit. 

## 2019-04-30 LAB — MICROALBUMIN / CREATININE URINE RATIO
Creatinine, Urine: 70.5 mg/dL
Microalb/Creat Ratio: 1413 mg/g creat — ABNORMAL HIGH (ref 0–29)
Microalbumin, Urine: 996.1 ug/mL

## 2019-04-30 LAB — HEMOGLOBIN A1C
Est. average glucose Bld gHb Est-mCnc: 223 mg/dL
Hgb A1c MFr Bld: 9.4 % — ABNORMAL HIGH (ref 4.8–5.6)

## 2019-04-30 LAB — CMP14+EGFR
ALT: 13 IU/L (ref 0–44)
AST: 13 IU/L (ref 0–40)
Albumin/Globulin Ratio: 1.8 (ref 1.2–2.2)
Albumin: 4.2 g/dL (ref 3.8–4.9)
Alkaline Phosphatase: 84 IU/L (ref 39–117)
BUN/Creatinine Ratio: 19 (ref 9–20)
BUN: 18 mg/dL (ref 6–24)
Bilirubin Total: 0.3 mg/dL (ref 0.0–1.2)
CO2: 22 mmol/L (ref 20–29)
Calcium: 10 mg/dL (ref 8.7–10.2)
Chloride: 98 mmol/L (ref 96–106)
Creatinine, Ser: 0.95 mg/dL (ref 0.76–1.27)
GFR calc Af Amer: 102 mL/min/{1.73_m2} (ref >59)
GFR calc non Af Amer: 88 mL/min/{1.73_m2} (ref >59)
Globulin, Total: 2.4 g/dL (ref 1.5–4.5)
Glucose: 284 mg/dL — ABNORMAL HIGH (ref 65–99)
Potassium: 4.7 mmol/L (ref 3.5–5.2)
Sodium: 134 mmol/L (ref 134–144)
Total Protein: 6.6 g/dL (ref 6.0–8.5)

## 2019-05-02 ENCOUNTER — Other Ambulatory Visit: Payer: Self-pay

## 2019-05-02 ENCOUNTER — Ambulatory Visit (INDEPENDENT_AMBULATORY_CARE_PROVIDER_SITE_OTHER): Payer: Self-pay | Admitting: Pharmacist

## 2019-05-02 ENCOUNTER — Telehealth: Payer: Self-pay | Admitting: Cardiology

## 2019-05-02 DIAGNOSIS — E782 Mixed hyperlipidemia: Secondary | ICD-10-CM

## 2019-05-02 MED ORDER — REPATHA SURECLICK 140 MG/ML ~~LOC~~ SOAJ: 140.0000 mg | SUBCUTANEOUS | 0 refills | Status: DC

## 2019-05-02 NOTE — Patient Instructions (Signed)
Lipid Clinic (pharmacist) [place] [facility] [phone]  *Repatha 140mg  sample given; call back if unable to tolerate* *AMGEN safetynet foundation application started today 05/01/2018* *Plan to repeat fasting blood work fater 5th dose of Repatha*    High Cholesterol  High cholesterol is a condition in which the blood has high levels of a white, waxy, fat-like substance (cholesterol). The human body needs small amounts of cholesterol. The liver makes all the cholesterol that the body needs. Extra (excess) cholesterol comes from the food that we eat. Cholesterol is carried from the liver by the blood through the blood vessels. If you have high cholesterol, deposits (plaques) may build up on the walls of your blood vessels (arteries). Plaques make the arteries narrower and stiffer. Cholesterol plaques increase your risk for heart attack and stroke. Work with your health care provider to keep your cholesterol levels in a healthy range. What increases the risk? This condition is more likely to develop in people who:  Eat foods that are high in animal fat (saturated fat) or cholesterol.  Are overweight.  Are not getting enough exercise.  Have a family history of high cholesterol. What are the signs or symptoms? There are no symptoms of this condition. How is this diagnosed? This condition may be diagnosed from the results of a blood test.  If you are older than age 20, your health care provider may check your cholesterol every 4-6 years.  You may be checked more often if you already have high cholesterol or other risk factors for heart disease. The blood test for cholesterol measures:  "Bad" cholesterol (LDL cholesterol). This is the main type of cholesterol that causes heart disease. The desired level for LDL is less than 100.  "Good" cholesterol (HDL cholesterol). This type helps to protect against heart disease by cleaning the arteries and carrying the LDL away. The desired  level for HDL is 60 or higher.  Triglycerides. These are fats that the body can store or burn for energy. The desired number for triglycerides is lower than 150.  Total cholesterol. This is a measure of the total amount of cholesterol in your blood, including LDL cholesterol, HDL cholesterol, and triglycerides. A healthy number is less than 200. How is this treated? This condition is treated with diet changes, lifestyle changes, and medicines. Diet changes  This may include eating more whole grains, fruits, vegetables, nuts, and fish.  This may also include cutting back on red meat and foods that have a lot of added sugar. Lifestyle changes  Changes may include getting at least 40 minutes of aerobic exercise 3 times a week. Aerobic exercises include walking, biking, and swimming. Aerobic exercise along with a healthy diet can help you maintain a healthy weight.  Changes may also include quitting smoking. Medicines  Medicines are usually given if diet and lifestyle changes have failed to reduce your cholesterol to healthy levels.  Your health care provider may prescribe a statin medicine. Statin medicines have been shown to reduce cholesterol, which can reduce the risk of heart disease. Follow these instructions at home: Eating and drinking If told by your health care provider:  Eat chicken (without skin), fish, veal, shellfish, ground 26 breast, and round or loin cuts of red meat.  Do not eat fried foods or fatty meats, such as hot dogs and salami.  Eat plenty of fruits, such as apples.  Eat plenty of vegetables, such as broccoli, potatoes, and carrots.  Eat beans, peas, and lentils.  Eat grains such as barley, rice,  couscous, and bulgur wheat.  Eat pasta without cream sauces.  Use skim or nonfat milk, and eat low-fat or nonfat yogurt and cheeses.  Do not eat or drink whole milk, cream, ice cream, egg yolks, or hard cheeses.  Do not eat stick margarine or tub  margarines that contain trans fats (also called partially hydrogenated oils).  Do not eat saturated tropical oils, such as coconut oil and palm oil.  Do not eat cakes, cookies, crackers, or other baked goods that contain trans fats.  General instructions  Exercise as directed by your health care provider. Increase your activity level with activities such as gardening, walking, and taking the stairs.  Take over-the-counter and prescription medicines only as told by your health care provider.  Do not use any products that contain nicotine or tobacco, such as cigarettes and e-cigarettes. If you need help quitting, ask your health care provider.  Keep all follow-up visits as told by your health care provider. This is important. Contact a health care provider if:  You are struggling to maintain a healthy diet or weight.  You need help to start on an exercise program.  You need help to stop smoking. Get help right away if:  You have chest pain.  You have trouble breathing. This information is not intended to replace advice given to you by your health care provider. Make sure you discuss any questions you have with your health care provider. Document Revised: 03/24/2017 Document Reviewed: 09/19/2015 Elsevier Patient Education  [place].

## 2019-05-02 NOTE — Telephone Encounter (Signed)
Pt states he received the message [facility] CMA left him to make his lipid clinic appt over at [facility] office.  Pt states he saw where they scheduled him to today 1/28 at 3 pm, and agrees to this appt date and time.  Pt states he will be there today as scheduled. Informed the pt that I will make Eather Colas CMA and Raquel PharmD know that he agrees to come in and see them today at 3 pm.  Pt verbalized understanding and agrees with this plan.

## 2019-05-02 NOTE — Progress Notes (Signed)
Patient ID: Dylan Anderson                 DOB: 1960-10-07                    MRN: [id number]     HPI: Dylan Anderson is a 59 y.o. male patient referred to lipid clinic by Dr Bettina Gavia. PMH is significant for hyperlipidemia, claudication, DM-II, hypertension, and PAD. Patient is compliant with positive lifestyle modifications including diet and exercise. LDL remains above goal while on atorvastatin 80mg  daily. Dylan Anderson is currently unemployed and without health insurance. He is a very good candidate for PCSK9i and eager to start therapy.   Current Medications:  Atorvastatin 80mg  daily fenofibrate 48mg  daily  Intolerances: none  LDL goal: 70mg /dL  Diet: eat lot of pasta, and rice. Only 2 meals a day (small breakfast and bid dinner).  Exercise: activities of daily living  Family History: The patient's family history includes COPD in his sister; Heart attack in his maternal aunt and maternal uncle; Heart failure in his mother; Osteoarthritis in his mother; Stroke in his sister.  Social History: current smoker of cigarettes and drinks alcohol  Labs: 03/19/2019: CHO 280; TG 275; HDL 186; LDL 186 (atorvastatin 80mg  daily and fenofibrate 48mg )  Past Medical History:  Diagnosis Date  . Acute gout involving toe of left foot 09/21/2017  . Arthritis   . Back pain   . Chest pain in adult 05/26/2018  . Chronic cough 07/13/2017  . Claudication ([place])   . Diabetes mellitus type II, non insulin dependent ([place]) 07/10/2012  . Erectile dysfunction   . Gastroesophageal reflux disease 04/27/2017  . Hand pain 03/18/2017  . Healthcare maintenance 07/12/2017  . Hip pain 05/20/2017  . Hyperlipidemia 01/25/2017  . Hypertension 07/10/2012  . Insomnia 02/06/2017  . Low back pain 01/25/2017  . Peripheral artery disease ([place]) 06/07/2017  . PVD (peripheral vascular disease) ([place])   . Tobacco abuse 07/10/2012  . Type 2 diabetes mellitus ([place])     Current Outpatient Medications on File Prior to Visit  Medication Sig  Dispense Refill  . amLODipine (NORVASC) 5 MG tablet Take 1 tablet (5 mg total) by mouth daily. 90 tablet 1  . aspirin EC 81 MG tablet Take 1 tablet (81 mg total) by mouth daily. 90 tablet 3  . atorvastatin (LIPITOR) 80 MG tablet Take 1 tablet (80 mg total) by mouth daily at 6 PM. 90 tablet 2  . buPROPion (WELLBUTRIN SR) 150 MG 12 hr tablet Take 1 tablet (150 mg total) by mouth 2 (two) times daily. 60 tablet 2  . fenofibrate (TRICOR) 48 MG tablet Take 1 tablet (48 mg total) by mouth daily. 90 tablet 2  . gabapentin (NEURONTIN) 300 MG capsule Take 2 capsules (600 mg total) by mouth 3 (three) times daily. 180 capsule 2  . glucose blood (TRUE METRIX BLOOD GLUCOSE TEST) test strip Use as instructed. Check blood glucose level by fingerstick three times per day. E11.65 200 each 6  . insulin aspart (NOVOLOG) 100 UNIT/ML FlexPen Inject 10 Units into the skin 2 (two) times daily with a meal. 9 mL 6  . Insulin Glargine (LANTUS) 100 UNIT/ML Solostar Pen Inject 25 units in skin morning and inject 25 units in skin at bedtime. 15 mL 11  . Insulin Pen Needle (BD ULTRA-FINE PEN NEEDLES) 29G X 12.7MM MISC ICD10 E11.9 for use with insulin administration 400 each 2  . losartan (COZAAR) 100 MG tablet Take 1 tablet (100 mg  total) by mouth daily. 90 tablet 1  . metFORMIN (GLUCOPHAGE) 500 MG tablet Take 2 tablets (1,000 mg total) by mouth 2 (two) times daily with a meal. 360 tablet 1  . metoprolol succinate (TOPROL-XL) 50 MG 24 hr tablet Take 1 tablet (50 mg total) by mouth at bedtime. 90 tablet 1  . naproxen sodium (ALEVE) 220 MG tablet Take 440 mg by mouth 2 (two) times daily as needed (pain).    . nitroGLYCERIN (NITROSTAT) 0.4 MG SL tablet Place 1 tablet (0.4 mg total) under the tongue every 5 (five) minutes as needed for chest pain. 90 tablet 3  . pantoprazole (PROTONIX) 20 MG tablet Take 1 tablet (20 mg total) by mouth daily. 90 tablet 1  . TRUEplus Lancets 28G MISC Use as instructed. Check blood glucose level by  fingerstick 3 times per day. E11.65 200 each 6   No current facility-administered medications on file prior to visit.    No Known Allergies  Hyperlipidemia LDL above goal for secondary prevention while on high intensity statin., Patient reports compliance with all medication but needs work on lifestyle modifications. We talked about strategies to decrease simple carbohydrates in diet and decrease tobacco use (not ready to quit yet). PCSK9i Repatha and Praleunt were discussed during this office visit. Indication, administration, common side affects, monitoring and patient assistance program were discusses as well.  Will submit paperwork for [facility] foundation for patient without insurance to request supply of the medication free o f charge. Repatha SureClick 140mg  samples x2 were provided today. Plan to repeat fasting blood work after 5th Repatha injection.  If AMGEN safety net is denied, we plan to apply for PASS patient assistance program for Praleunt 150mg .    Dylan Anderson PharmD, BCPS, CPP [facility] [address] [place] [address] 05/05/2019 3:42 PM

## 2019-05-02 NOTE — Telephone Encounter (Signed)
Thanks for the update

## 2019-05-02 NOTE — Telephone Encounter (Signed)
New Message ° °Patient is returning call. Please give patient a call back.  °

## 2019-05-05 ENCOUNTER — Encounter: Payer: Self-pay | Admitting: Pharmacist

## 2019-05-05 NOTE — Assessment & Plan Note (Signed)
LDL above goal for secondary prevention while on high intensity statin., Patient reports compliance with all medication but needs work on lifestyle modifications. We talked about strategies to decrease simple carbohydrates in diet and decrease tobacco use (not ready to quit yet). PCSK9i Repatha and Praleunt were discussed during this office visit. Indication, administration, common side affects, monitoring and patient assistance program were discusses as well.  Will submit paperwork for [facility] foundation for patient without insurance to request supply of the medication free o f charge. Repatha SureClick 140mg  samples x2 were provided today. Plan to repeat fasting blood work after 5th Repatha injection.  If AMGEN safety net is denied, we plan to apply for PASS patient assistance program for Praleunt 150mg .

## 2019-05-07 ENCOUNTER — Telehealth (INDEPENDENT_AMBULATORY_CARE_PROVIDER_SITE_OTHER): Payer: Self-pay

## 2019-05-07 ENCOUNTER — Ambulatory Visit: Payer: Self-pay

## 2019-05-07 NOTE — Telephone Encounter (Signed)
-----   Message from Claiborne Rigg, NP sent at 05/04/2019 11:18 PM EST ----- Urine shows microscopic diabetic kidney damage. Taking your losartan every day to help prevent or reduce kidney damage. Liver function is normal. A1c up from 8.3 to 9.4. Continue to take your medications as prescribed and monitor your dietary intake.

## 2019-05-07 NOTE — Telephone Encounter (Signed)
Patient verified date of birth. He is aware that urine shows microscopic diabetic kidney damage. Advised to take losartan daily to help prevent or reduce kidney damage. He is aware that liver function is normal. Patient advised to continue taking diabetic medications and monitor dietary intake as A1c is up from 8.3 to 9.4. he verbalized understanding. Maryjean Morn, CMA

## 2019-05-08 ENCOUNTER — Telehealth: Payer: Self-pay

## 2019-05-08 NOTE — Telephone Encounter (Signed)
Spoke with [place] (patient contact).  She states that he took Repatha Monday night.  Then Tuesday morning he started feeling shaky and weak.  Felt that way all day yesterday (Tues) and still feels that way today (Wed).    Advised that we haven't seen this side effect, and since he is still feeling poorly, he should check with PCP.  She reports no fever or other issues.  She voiced understanding.

## 2019-05-08 NOTE — Telephone Encounter (Signed)
Pt called in stating that they feel very shaky like parkinson's disease and that they feel extremely nervous since starting the repatha sample that they were given in the office. Will route to the pharmd pool.

## 2019-05-09 NOTE — Telephone Encounter (Signed)
Called and followed up with pt regarding the feelings of nervousness and shaking. The pt stated that they feel much better and that they still want [place] to proceed with trying to get the medication free from the manufacturer. Will submit that request by fax today to the amgen safetynet foundation

## 2019-05-13 ENCOUNTER — Telehealth: Payer: Self-pay

## 2019-05-13 NOTE — Telephone Encounter (Signed)
Called and lmomed the pt informing him that he was approved for the repatha sureclick from [employer] where he will receive it free from the manufacturer and gave him their direct phone number to call to set up the delivery dates. The grant for free medication will expire 05/09/20.

## 2019-06-20 MED FILL — RANOLAZINE ER 500 MG TABLET: 500 | 30 days supply | Qty: 60 | Fill #1

## 2019-07-17 MED FILL — TRUEplus LANCETS 28G MISC: 30 days supply | Qty: 100 | Fill #1

## 2019-07-17 MED FILL — METFORMIN HCL 500 MG TABS: 500 | 30 days supply | Qty: 120 | Fill #1

## 2019-07-17 MED FILL — FENOFIBRATE 48 MG TABLET: 48 | 30 days supply | Qty: 30 | Fill #1

## 2019-07-17 MED FILL — ATORVASTATIN 80 MG TABLET: 80 | 30 days supply | Qty: 30 | Fill #1

## 2019-07-17 MED FILL — LOSARTAN POTASSIUM 100 MG T: 100 | 30 days supply | Qty: 30 | Fill #1

## 2019-07-17 MED FILL — TRUE METRIX TEST STRIP: 30 days supply | Qty: 100 | Fill #1

## 2019-07-17 MED FILL — METOPROLOL SUCCINATE ER 50: 50 | 30 days supply | Qty: 30 | Fill #1

## 2019-07-17 MED FILL — AMLODIPINE BESYLATE 5 MG TA: 5 | 30 days supply | Qty: 30 | Fill #1

## 2019-07-17 MED FILL — GABAPENTIN 300 MG CAPSULE: 300 | 30 days supply | Qty: 180 | Fill #1

## 2019-07-17 MED FILL — BUPROPION SR 150 MG TABLET: 150 | 30 days supply | Qty: 60 | Fill #1

## 2019-08-09 ENCOUNTER — Telehealth: Payer: Self-pay | Admitting: Family Medicine

## 2019-08-09 NOTE — Telephone Encounter (Signed)
Mrs. Dylan Anderson called stating that Dylan Anderson told her he was going to kill hisself. I called 911 to do a well care check.

## 2019-08-13 ENCOUNTER — Telehealth: Payer: Self-pay | Admitting: Licensed Clinical Social Worker

## 2019-08-13 NOTE — Telephone Encounter (Signed)
Call placed to patient. LCSW introduced self and explained role at [facility]. Pt was open to scheduling appointment with LCSW on 08/26/19, same day as meeting with PCP.   Pt was strongly encouraged to contact LCSW or PCP should he need to meet sooner. Pt verbalized understanding. No additional concerns noted.

## 2019-08-23 ENCOUNTER — Telehealth: Payer: Self-pay

## 2019-08-23 NOTE — Telephone Encounter (Signed)

## 2019-08-26 ENCOUNTER — Ambulatory Visit (INDEPENDENT_AMBULATORY_CARE_PROVIDER_SITE_OTHER): Payer: Self-pay | Admitting: Licensed Clinical Social Worker

## 2019-08-26 ENCOUNTER — Encounter: Payer: Self-pay | Admitting: Internal Medicine

## 2019-08-26 ENCOUNTER — Other Ambulatory Visit: Payer: Self-pay | Admitting: Internal Medicine

## 2019-08-26 ENCOUNTER — Ambulatory Visit (INDEPENDENT_AMBULATORY_CARE_PROVIDER_SITE_OTHER): Payer: Self-pay | Admitting: Internal Medicine

## 2019-08-26 ENCOUNTER — Ambulatory Visit (INDEPENDENT_AMBULATORY_CARE_PROVIDER_SITE_OTHER): Payer: Self-pay

## 2019-08-26 ENCOUNTER — Other Ambulatory Visit: Payer: Self-pay

## 2019-08-26 VITALS — BP 158/79 | HR 65 | Temp 97.5°F | Resp 17 | Wt 149.0 lb

## 2019-08-26 DIAGNOSIS — F322 Major depressive disorder, single episode, severe without psychotic features: Secondary | ICD-10-CM

## 2019-08-26 DIAGNOSIS — Z1211 Encounter for screening for malignant neoplasm of colon: Secondary | ICD-10-CM

## 2019-08-26 DIAGNOSIS — F411 Generalized anxiety disorder: Secondary | ICD-10-CM

## 2019-08-26 DIAGNOSIS — E1142 Type 2 diabetes mellitus with diabetic polyneuropathy: Secondary | ICD-10-CM

## 2019-08-26 DIAGNOSIS — IMO0002 Reserved for concepts with insufficient information to code with codable children: Secondary | ICD-10-CM

## 2019-08-26 DIAGNOSIS — E118 Type 2 diabetes mellitus with unspecified complications: Secondary | ICD-10-CM

## 2019-08-26 DIAGNOSIS — F329 Major depressive disorder, single episode, unspecified: Secondary | ICD-10-CM

## 2019-08-26 DIAGNOSIS — F32A Depression, unspecified: Secondary | ICD-10-CM

## 2019-08-26 DIAGNOSIS — M25559 Pain in unspecified hip: Secondary | ICD-10-CM

## 2019-08-26 DIAGNOSIS — Z1159 Encounter for screening for other viral diseases: Secondary | ICD-10-CM

## 2019-08-26 DIAGNOSIS — E1165 Type 2 diabetes mellitus with hyperglycemia: Secondary | ICD-10-CM

## 2019-08-26 DIAGNOSIS — Z114 Encounter for screening for human immunodeficiency virus [HIV]: Secondary | ICD-10-CM

## 2019-08-26 MED ORDER — NITROGLYCERIN 0.4 MG SL SUBL: 0.4000 mg | SUBLINGUAL_TABLET | SUBLINGUAL | 3 refills | Status: DC | PRN

## 2019-08-26 MED ORDER — GABAPENTIN 300 MG PO CAPS: 600.0000 mg | ORAL_CAPSULE | Freq: Three times a day (TID) | ORAL | 2 refills | Status: DC

## 2019-08-26 MED ORDER — METFORMIN HCL 500 MG PO TABS: 1000.0000 mg | ORAL_TABLET | Freq: Two times a day (BID) | ORAL | 1 refills | Status: DC

## 2019-08-26 MED ORDER — PREGABALIN 50 MG PO CAPS: 50.0000 mg | ORAL_CAPSULE | Freq: Three times a day (TID) | ORAL | 1 refills | Status: DC

## 2019-08-26 MED ORDER — BUPROPION HCL ER (SR) 200 MG PO TB12: 200.0000 mg | ORAL_TABLET | Freq: Two times a day (BID) | ORAL | 0 refills | Status: DC

## 2019-08-26 MED FILL — METFORMIN HCL 500 MG TABS: 500 | 30 days supply | Qty: 120 | Fill #0

## 2019-08-26 MED FILL — NITROGLYCERIN 0.4 MG TAB SL: 0.4 | 25 days supply | Qty: 25 | Fill #0

## 2019-08-26 MED FILL — BUPROPION HCL SR 200 MG TAB: 200 | 30 days supply | Qty: 60 | Fill #0

## 2019-08-26 MED FILL — GABAPENTIN 300 MG CAPSULE: 300 | 30 days supply | Qty: 180 | Fill #0

## 2019-08-26 MED FILL — PREGABALIN 50 MG CAPS: 50 | 30 days supply | Qty: 90 | Fill #0

## 2019-08-26 NOTE — Progress Notes (Signed)
Subjective:    Dylan Anderson - 59 y.o. male MRN [id number]  Date of birth: Aug 10, 1960  HPI  Dylan Anderson is here for follow up of concerns.  Hip Pain: Bilateral. Present for many years. Worsening over the past few months. Present laterally. Reports worsened with periods of prolonged walking and with lifting heavy objects. Pain will ease off with sitting for 10-15 minutes.   Depression: Takes Wellbutrin SR 150 mg q12h. Reports depression is "about the same". Interested in trying a dose increase on Wellbutrin. Has appointment [facility] after that. Denies any thoughts of harming himself.   Depression screen [facility] 2/9 08/26/2019 04/24/2019 01/16/2019  Decreased Interest 3 1 2   Down, Depressed, Hopeless 3 1 2   PHQ - 2 Score 6 2 4   Altered sleeping 2 0 3  Tired, decreased energy 3 0 3  Change in appetite 3 0 3  Feeling bad or failure about yourself  2 0 3  Trouble concentrating 2 0 3  Moving slowly or fidgety/restless 3 0 1  Suicidal thoughts 1 0 1  PHQ-9 Score 22 2 21    GAD 7 : Generalized Anxiety Score 08/26/2019 01/16/2019 10/09/2018 06/11/2018  Nervous, Anxious, on Edge 3 2 0 2  Control/stop worrying 3 3 0 3  Worry too much - different things 3 3 0 3  Trouble relaxing 3 2 0 2  Restless 3 3 0 1  Easily annoyed or irritable 2 2 0 1  Afraid - awful might happen 2 2 0 0  Total GAD 7 Score 19 17 0 12      Health Maintenance:  Health Maintenance Due  Topic Date Due  . Hepatitis C Screening  Never done  . OPHTHALMOLOGY EXAM  Never done  . COVID-19 Vaccine (1) Never done  . HIV Screening  Never done  . COLONOSCOPY  Never done  . FOOT EXAM  02/02/2018    -  reports that he has been smoking cigarettes. He started smoking about 41 years ago. He has a 8.50 pack-year smoking history. He has never used smokeless tobacco. - Review of Systems: Per HPI. - Past Medical History: Patient Active Problem List   Diagnosis Date Noted  . Dizzy 09/21/2018  . Mild CAD 07/08/2018  . Abnormal cardiac CT  angiography   . PVC's (premature ventricular contractions) 05/28/2018  . Chest pain in adult 05/26/2018  . Claudication (HCC) 11/05/2017  . Acute gout involving toe of left foot 09/21/2017  . Chronic cough 07/13/2017  . Healthcare maintenance 07/12/2017  . Peripheral artery disease (HCC) 06/07/2017  . Hip pain 05/20/2017  . Gastroesophageal reflux disease 04/27/2017  . Hand pain 03/18/2017  . Insomnia 02/06/2017  . Hyperlipidemia 01/25/2017  . Erectile dysfunction 01/25/2017  . Low back pain 01/25/2017  . Diabetes mellitus type II, non insulin dependent (HCC) 07/10/2012  . Hypertension 07/10/2012  . Tobacco abuse 07/10/2012   - Medications: reviewed and updated   Objective:   Physical Exam BP (!) 158/79   Pulse 65   Temp (!) 97.5 F (36.4 C) (Temporal)   Resp 17   Wt 149 lb (67.6 kg)   SpO2 98%   BMI 21.38 kg/m  Physical Exam  Constitutional: He is oriented to person, place, and time and well-developed, well-nourished, and in no distress. No distress.  Cardiovascular: Normal rate.  Pulmonary/Chest: Effort normal. No respiratory distress.  Musculoskeletal:        General: Normal range of motion.     Comments: No pain to palpation over the  greater trochanter. Patient does have pain with internal rotation but no limitations for ROM.   Neurological: He is alert and oriented to person, place, and time.  Skin: Skin is warm and dry. He is not diaphoretic.  Psychiatric: Affect and judgment normal.           Assessment & Plan:   1. Hip pain Given history and exam, most concerning for OA. Will obtain imaging. Will likely need referral to orthopedics.  - DG HIPS BILAT WITH PELVIS MIN 5 VIEWS; Future  2. Depression, unspecified depression type PHQ-9 and GAD-7 elevated. Will increase Wellbutrin dose. No safety concerns at present. Follow up with LCSW and may benefit from establishing with therapist.  - buPROPion (WELLBUTRIN SR) 200 MG 12 hr tablet; Take 1 tablet (200 mg  total) by mouth 2 (two) times daily.  Dispense: 180 tablet; Refill: 0  3. DM (diabetes mellitus), type 2, uncontrolled with complications ([place]) Last A1c in Feb 2021 with result of 9 shows poor control. Needs f/u to address DM.  - metFORMIN (GLUCOPHAGE) 500 MG tablet; Take 2 tablets (1,000 mg total) by mouth 2 (two) times daily with a meal.  Dispense: 360 tablet; Refill: 1  4. Diabetic polyneuropathy associated with type 2 diabetes mellitus (HCC) Neuropathic pain not controlled despite increase Gabapentin at last visit. Will trial Lyrica.  - pregabalin (LYRICA) 50 MG capsule; Take 1 capsule (50 mg total) by mouth 3 (three) times daily.  Dispense: 90 capsule; Refill: 1  5. Screening for colon cancer - Ambulatory referral to Gastroenterology  6. Screening for HIV (human immunodeficiency virus) - HIV Antibody (routine testing w rflx)  7. Need for hepatitis C screening test - Hepatitis C antibody      Phill Myron, D.O. 08/26/2019, 10:14 AM Primary Care at [facility]

## 2019-08-26 NOTE — BH Specialist Note (Signed)
Integrated Behavioral Health Initial Visit  MRN: [id number] Name: Dylan Anderson  Number of Integrated Behavioral Health Clinician visits:: 1/6 Session Start time: 10:45 AM  Session End time: 11:15 AM Total time: 30  Type of Service: Integrated Behavioral Health- Individual Interpretor:No. Interpretor Name and Language: NA   Warm Hand Off Completed.       SUBJECTIVE: Dylan Anderson is a 59 y.o. male accompanied by self Patient was referred by Dr. Earlene Plater for depression and anxiety. Patient reports the following symptoms/concerns: Pt reports difficulty managing physical and mental health conditions triggered by psychosocial stressors Duration of problem: Ongoing; Severity of problem: severe  OBJECTIVE: Mood: Depressed and Affect: Appropriate Risk of harm to self or others: No plan to harm self or others Pt scored positive on phq9; however, denies current SI/HI. Protective factors were identified and crisis intervention resources provided  LIFE CONTEXT: Family and Social: Pt recently divorced from marriage after 37 years. He provides care to grandchild School/Work: Pt has a pending disability case with the assistance from a private lawyer. He is unemployed due to medical conditions Self-Care: Pt participates in medication management through PCP. He tracks blood sugars to assist in management of diabetes and drinks occasionally 1-2 x weekly Life Changes: Pt has difficulty managing mental and physical health conditions triggered by psychosocial stressors  GOALS ADDRESSED: Patient will: 1. Reduce symptoms of: anxiety, depression and stress 2. Increase knowledge and/or ability of: coping skills and healthy habits  3. Demonstrate ability to: Increase healthy adjustment to current life circumstances, Increase adequate support systems for patient/family and Increase motivation to adhere to plan of care  INTERVENTIONS: Interventions utilized: Solution-Focused Strategies, Supportive Counseling,  Psychoeducation and/or Health Education and Link to [employer]  Standardized Assessments completed: GAD-7 and PHQ 2&9 with C-SSRS  ASSESSMENT: Patient currently experiencing increase in depression and anxiety symptoms triggered by psychosocial stressors. Pt endorses difficulty sleeping, withdrawn behavior, racing thoughts, and irritability.   Pt scored positive on phq9; however, denies current SI/HI. Protective factors were identified and crisis intervention resources provided.   Patient may benefit from continued medication management and initiation of therapy to assist in management of symptoms. LCSW discussed correlation between one's physical and mental health, in addition, to how stress can negatively impact both. Support and validation was provided.   Pt was provided community resources for therapy and was strongly encouraged to apply for financial counseling to assist with medical costs.   PLAN: 1. Follow up with behavioral health clinician on : Schedule follow up appointment with LCSW to re-assess symptoms and/or resource needs 2. Behavioral recommendations: Utilize healthy coping skills discussed and continue with medication management. Utilize resources as needed 3. Referral(s): Integrated Art gallery manager (In Clinic) and Community Mental Health Services (LME/Outside Clinic) 4. "From scale of 1-10, how likely are you to follow plan?":   Bridgett Larsson, LCSW 08/26/19 12:31 PM

## 2019-08-27 LAB — HIV ANTIBODY (ROUTINE TESTING W REFLEX): HIV Screen 4th Generation wRfx: NONREACTIVE

## 2019-08-27 LAB — HEPATITIS C ANTIBODY: Hep C Virus Ab: <0.1 s/co ratio (ref 0.0–0.9)

## 2019-08-30 MED FILL — ?BASAGLAR 100 UNITS/ML KWPE: 100 | 30 days supply | Qty: 15 | Fill #1

## 2019-08-30 MED FILL — $novoLOG FLEXPEN SYRINGE: 100 | 30 days supply | Qty: 6 | Fill #1

## 2019-08-30 NOTE — Progress Notes (Signed)
Patient notified of results & recommendations. Expressed understanding.

## 2019-09-05 ENCOUNTER — Telehealth: Payer: Self-pay | Admitting: Cardiology

## 2019-09-05 NOTE — Telephone Encounter (Signed)
Spoke with the patient and let him know that we have not received any medications for him as of this time at the office. He verbalizes understanding and states that he will call them to verify where they delivered it to. He also requests to make an appointment with Dr. Dulce Sellar. This was done and he is now scheduled for 09/27/19. No other issues or concerns were noted at this time.   Encouraged patient to call back with any questions or concerns.

## 2019-09-05 NOTE — Telephone Encounter (Signed)
    Pt c/o medication issue:  1. Name of Medication:   Evolocumab (REPATHA SURECLICK) 140 MG/ML SOAJ     2. How are you currently taking this medication (dosage and times per day)? Inject 140 mg into the skin every 14 (fourteen) days.  3. Are you having a reaction (difficulty breathing--STAT)?   4. What is your medication issue? Pt said patient assistance program told him they delivered his repatha at DR. Munley's office. He wanted to know if he needs to pick it up   Please advise

## 2019-09-23 ENCOUNTER — Telehealth: Payer: Self-pay

## 2019-09-23 NOTE — Telephone Encounter (Signed)

## 2019-09-24 ENCOUNTER — Other Ambulatory Visit: Payer: Self-pay

## 2019-09-24 ENCOUNTER — Encounter: Payer: Self-pay | Admitting: Internal Medicine

## 2019-09-24 ENCOUNTER — Ambulatory Visit (INDEPENDENT_AMBULATORY_CARE_PROVIDER_SITE_OTHER): Payer: Self-pay | Admitting: Internal Medicine

## 2019-09-24 VITALS — BP 134/85 | HR 75 | Temp 97.9°F | Resp 17 | Wt 150.0 lb

## 2019-09-24 DIAGNOSIS — Z1211 Encounter for screening for malignant neoplasm of colon: Secondary | ICD-10-CM

## 2019-09-24 DIAGNOSIS — I10 Essential (primary) hypertension: Secondary | ICD-10-CM

## 2019-09-24 DIAGNOSIS — Z794 Long term (current) use of insulin: Secondary | ICD-10-CM

## 2019-09-24 DIAGNOSIS — E1159 Type 2 diabetes mellitus with other circulatory complications: Secondary | ICD-10-CM

## 2019-09-24 LAB — POCT GLYCOSYLATED HEMOGLOBIN (HGB A1C): Hemoglobin A1C: 9.4 % — AB (ref 4.0–5.6)

## 2019-09-24 NOTE — Patient Instructions (Addendum)
For every morning that your fasting blood sugar is >130, increase your insulin dose by 1 units. For example if tomorrow your blood sugar prior to eating breakfast is 160, you would take 26 units of insulin. If, the following day you blood sugar is 140 you would take 27 units of insulin. Once you have a fasting blood sugar <130, stop increasing your dose and continue to take the number of units you had most recently gotten up to. Do not go beyond 30 units twice per day. Please call for blood sugars less than 80.

## 2019-09-24 NOTE — Progress Notes (Signed)
Subjective:    Dylan Anderson - 59 y.o. male MRN [id number]  Date of birth: 03-27-1961  HPI  Dylan Anderson is here for follow up on chronic medical conditions.  Chronic HTN Disease Monitoring:  Home BP Monitoring - Does not monitor  Chest pain- no  Dyspnea- no Headache - no  Medications: Amlodipine 5 mg, Losartan 100 mg, Metoprolol 50 mg  Compliance- yes Lightheadedness- no  Edema- no    Diabetes mellitus, Type 2 Disease Monitoring             Blood Sugar Ranges: Fasting - low to mid 200s              Polyuria: no             Visual problems: no   Urine Microalbumin 1413. On Losartan.   Last A1C: 9.4 (Jan 2021)   Medications: Lantus 25u BID, Novolog 10u 2x daily with meal, Metformin 1000 mg BID Medication Compliance: yes  Medication Side Effects             Hypoglycemia: no    Health Maintenance:  Health Maintenance Due  Topic Date Due  . COVID-19 Vaccine (1) Never done  . COLON CANCER SCREENING ANNUAL FOBT  Never done  . FOOT EXAM  02/02/2018    -  reports that he has been smoking cigarettes. He started smoking about 41 years ago. He has a 8.50 pack-year smoking history. He has never used smokeless tobacco. - Review of Systems: Per HPI. - Past Medical History: Patient Active Problem List   Diagnosis Date Noted  . Dizzy 09/21/2018  . Mild CAD 07/08/2018  . Abnormal cardiac CT angiography   . PVC's (premature ventricular contractions) 05/28/2018  . Chest pain in adult 05/26/2018  . Claudication (HCC) 11/05/2017  . Acute gout involving toe of left foot 09/21/2017  . Chronic cough 07/13/2017  . Healthcare maintenance 07/12/2017  . Peripheral artery disease (HCC) 06/07/2017  . Hip pain 05/20/2017  . Gastroesophageal reflux disease 04/27/2017  . Hand pain 03/18/2017  . Insomnia 02/06/2017  . Hyperlipidemia 01/25/2017  . Erectile dysfunction 01/25/2017  . Low back pain 01/25/2017  . Diabetes mellitus type II, non insulin dependent (HCC)  07/10/2012  . Hypertension 07/10/2012  . Tobacco abuse 07/10/2012   - Medications: reviewed and updated   Objective:   Physical Exam BP 134/85   Pulse 75   Temp 97.9 F (36.6 C) (Temporal)   Resp 17   Wt 150 lb (68 kg)   SpO2 98%   BMI 21.52 kg/m  Physical Exam Constitutional:      General: He is not in acute distress.    Appearance: He is not diaphoretic.  HENT:     Head: Normocephalic and atraumatic.  Eyes:     Conjunctiva/sclera: Conjunctivae normal.  Cardiovascular:     Rate and Rhythm: Normal rate and regular rhythm.     Heart sounds: Normal heart sounds. No murmur heard.   Pulmonary:     Effort: Pulmonary effort is normal. No respiratory distress.     Breath sounds: Normal breath sounds.  Musculoskeletal:        General: Normal range of motion.  Skin:    General: Skin is warm and dry.  Neurological:     Mental Status: He is alert and oriented to person, place, and time.  Psychiatric:        Mood and Affect: Affect normal.        Judgment: Judgment normal.  Assessment & Plan:   1. Type 2 diabetes mellitus with other circulatory complication, with long-term current use of insulin (HCC) A1c remains above goal with result of 9.4%. Fasting CBGs have been elevated on home readings. Will have patient titrate insulin slowly based on home readings up to potential dose of 30u BID based on fasting CBG readings. Return in 4-6 weeks to follow up on glucose control.  Counseled on Diabetic diet, my plate method, 606 minutes of moderate intensity exercise/week Blood sugar logs with fasting goals of 80-120 mg/dl, random of less than 180 and in the event of sugars less than 60 mg/dl or greater than 400 mg/dl encouraged to notify the clinic. Advised on the need for annual eye exams, annual foot exams, Pneumonia vaccine. - HgB A1c - HM Diabetes Foot Exam  2. Essential hypertension BP is at goal today. Continue current regimen.   3. Colon cancer screening -  Fecal occult blood, imunochemical([facility])      Phill Myron, D.O. 09/24/2019, 2:25 PM Primary Care at [facility]

## 2019-09-27 ENCOUNTER — Ambulatory Visit (INDEPENDENT_AMBULATORY_CARE_PROVIDER_SITE_OTHER): Payer: Self-pay | Admitting: Cardiology

## 2019-09-27 ENCOUNTER — Other Ambulatory Visit: Payer: Self-pay

## 2019-09-27 ENCOUNTER — Encounter: Payer: Self-pay | Admitting: Cardiology

## 2019-09-27 VITALS — BP 130/68 | HR 60 | Ht 70.0 in | Wt 153.0 lb

## 2019-09-27 DIAGNOSIS — I10 Essential (primary) hypertension: Secondary | ICD-10-CM

## 2019-09-27 DIAGNOSIS — I2510 Atherosclerotic heart disease of native coronary artery without angina pectoris: Secondary | ICD-10-CM

## 2019-09-27 DIAGNOSIS — I779 Disorder of arteries and arterioles, unspecified: Secondary | ICD-10-CM

## 2019-09-27 DIAGNOSIS — E119 Type 2 diabetes mellitus without complications: Secondary | ICD-10-CM

## 2019-09-27 DIAGNOSIS — E782 Mixed hyperlipidemia: Secondary | ICD-10-CM

## 2019-09-27 NOTE — Progress Notes (Signed)
Cardiology Office Note:    Date:  09/27/2019   ID:  Dylan Anderson, DOB 12/30/1960, MRN [id number]  PCP:  Arvilla Market, DO  Cardiologist:  Norman Herrlich, MD    Referring MD: Leary Roca*    ASSESSMENT:    1. Mixed hyperlipidemia   2. Mild CAD   3. Essential hypertension   4. PAOD (peripheral arterial occlusive disease) (HCC)   5. Diabetes mellitus type II, non insulin dependent (HCC)    PLAN:    In order of problems listed above:  1. Recheck lipids goal LDL less than 70 ideally less than 55 if he is improved we will discontinue a statin or use a low intensity. 2. Stable CAD having no angina on current medical treatment New [place] Heart Association class I new therapy including aspirin beta-blocker and lipid-lowering treatment.  At this time do not think he needs an ischemia evaluation 3. stable hypertension continue current treatment including ARB with diabetes 4. Stable 5. diabetes is insulin-dependent poorly controlled asked him to speak to his physicians about an insulin pump   Next appointment: 6 months   Medication Adjustments/Labs and Tests Ordered: Current medicines are reviewed at length with the patient today.  Concerns regarding medicines are outlined above.  No orders of the defined types were placed in this encounter.  No orders of the defined types were placed in this encounter.   No chief complaint on file.   History of Present Illness:    Dylan Anderson is a 59 y.o. male with a hx of CAD, HTN, DLD, PVC"s. Left heart  cath 06/19/18 showed mild diffuse CAD with focal 80% ostial proximal narrowing of the ramus branch which was small in caliber   treated medically. He was last seen 03/19/2019. Compliance with diet, lifestyle and medications: Yes  From cardiac perspective he is doing well has had no angina shortness of breath palpitation or syncope.  He is taking both Repatha and Lipitor along with fenofibrate and complains of muscle pain and  weakness.  He also is having exertional claudication when he walks uphill.  I will recheck his lipid profile if he is at target we will either discontinue his statin or use a low-dose of a low intensity to try to ameliorate his muscle symptoms. Past Medical History:  Diagnosis Date   Acute gout involving toe of left foot 09/21/2017   Arthritis    Back pain    Chest pain in adult 05/26/2018   Chronic cough 07/13/2017   Claudication (HCC)    Diabetes mellitus type II, non insulin dependent (HCC) 07/10/2012   Erectile dysfunction    Gastroesophageal reflux disease 04/27/2017   Hand pain 03/18/2017   Healthcare maintenance 07/12/2017   Hip pain 05/20/2017   Hyperlipidemia 01/25/2017   Hypertension 07/10/2012   Insomnia 02/06/2017   Low back pain 01/25/2017   Peripheral artery disease (HCC) 06/07/2017   PVD (peripheral vascular disease) (HCC)    Tobacco abuse 07/10/2012   Type 2 diabetes mellitus (HCC)     Past Surgical History:  Procedure Laterality Date   ILIAC ARTERY STENT     LEFT HEART CATH AND CORONARY ANGIOGRAPHY N/A 06/19/2018   Procedure: LEFT HEART CATH AND CORONARY ANGIOGRAPHY;  Surgeon: Lennette Bihari, MD;  Location: [facility] INVASIVE CV LAB;  Service: Cardiovascular;  Laterality: N/A;   stent Left    left common and left internal illiac stent    Current Medications: Current Meds  Medication Sig   amLODipine (NORVASC) 5 MG  tablet Take 1 tablet (5 mg total) by mouth daily.   aspirin EC 81 MG tablet Take 1 tablet (81 mg total) by mouth daily.   atorvastatin (LIPITOR) 80 MG tablet Take 1 tablet (80 mg total) by mouth daily at 6 PM.   buPROPion (WELLBUTRIN SR) 200 MG 12 hr tablet Take 1 tablet (200 mg total) by mouth 2 (two) times daily.   Evolocumab (REPATHA SURECLICK) 140 MG/ML SOAJ Inject 140 mg into the skin every 14 (fourteen) days.   fenofibrate (TRICOR) 48 MG tablet Take 1 tablet (48 mg total) by mouth daily.   glucose blood (TRUE METRIX BLOOD GLUCOSE  TEST) test strip Use as instructed. Check blood glucose level by fingerstick three times per day. E11.65   insulin aspart (NOVOLOG) 100 UNIT/ML FlexPen Inject 10 Units into the skin 2 (two) times daily with a meal.   Insulin Glargine (LANTUS) 100 UNIT/ML Solostar Pen Inject 25 units in skin morning and inject 25 units in skin at bedtime.   Insulin Pen Needle (BD ULTRA-FINE PEN NEEDLES) 29G X 12.7MM MISC ICD10 E11.9 for use with insulin administration   losartan (COZAAR) 100 MG tablet Take 1 tablet (100 mg total) by mouth daily.   metFORMIN (GLUCOPHAGE) 500 MG tablet Take 2 tablets (1,000 mg total) by mouth 2 (two) times daily with a meal.   metoprolol succinate (TOPROL-XL) 50 MG 24 hr tablet Take 1 tablet (50 mg total) by mouth at bedtime.   nitroGLYCERIN (NITROSTAT) 0.4 MG SL tablet Place 1 tablet (0.4 mg total) under the tongue every 5 (five) minutes as needed for chest pain.   pantoprazole (PROTONIX) 20 MG tablet Take 1 tablet (20 mg total) by mouth daily.   pregabalin (LYRICA) 50 MG capsule Take 1 capsule (50 mg total) by mouth 3 (three) times daily.   TRUEplus Lancets 28G MISC Use as instructed. Check blood glucose level by fingerstick 3 times per day. E11.65     Allergies:   Patient has no known allergies.   Social History   Socioeconomic History   Marital status: Legally Separated    Spouse name: Not on file   Number of children: Not on file   Years of education: Not on file   Highest education level: Not on file  Occupational History   Occupation: unemployed  Tobacco Use   Smoking status: Current Every Day Smoker    Packs/day: 0.50    Years: 17.00    Pack years: 8.50    Types: Cigarettes    Start date: 04/04/1978   Smokeless tobacco: Never Used   Tobacco comment: Max 1 ppd  Vaping Use   Vaping Use: Never used  Substance and Sexual Activity   Alcohol use: Yes    Comment: 2-3 mixed drinks 3 times per week   Drug use: No   Sexual activity: Yes     Partners: Female  Other Topics Concern   Not on file  Social History Narrative   Not on file   Social Determinants of Health   Financial Resource Strain:    Difficulty of Paying Living Expenses:   Food Insecurity:    Worried About Programme researcher, broadcasting/film/video in the Last Year:    Barista in the Last Year:   Transportation Needs:    Freight forwarder (Medical):    Lack of Transportation (Non-Medical):   Physical Activity:    Days of Exercise per Week:    Minutes of Exercise per Session:   Stress:  Feeling of Stress :   Social Connections:    Frequency of Communication with Friends and Family:    Frequency of Social Gatherings with Friends and Family:    Attends Religious Services:    Active Member of Clubs or Organizations:    Attends Archivist Meetings:    Marital Status:      Family History: The patient's family history includes COPD in his sister; Heart attack in his maternal aunt and maternal uncle; Heart failure in his mother; Osteoarthritis in his mother; Stroke in his sister. ROS:   Please see the history of present illness.    All other systems reviewed and are negative.  EKGs/Labs/Other Studies Reviewed:    The following studies were reviewed today:  EKG:  EKG ordered today and personally reviewed.  The ekg ordered today demonstrates sinus rhythm and is normal  Recent Labs: 04/29/2019: ALT 13; BUN 18; Creatinine, Ser 0.95; Potassium 4.7; Sodium 134  Recent Lipid Panel    Component Value Date/Time   CHOL 280 (H) 03/19/2019 0900   TRIG 275 (H) 03/19/2019 0900   HDL 41 03/19/2019 0900   CHOLHDL 6.8 (H) 03/19/2019 0900   CHOLHDL 5.7 07/10/2012 1301   VLDL 36 07/10/2012 1301   LDLCALC 186 (H) 03/19/2019 0900    Physical Exam:    VS:  BP 130/68    Pulse 60    Ht 5\' 10"  (1.778 m)    Wt 153 lb (69.4 kg)    SpO2 98%    BMI 21.95 kg/m     Wt Readings from Last 3 Encounters:  09/27/19 153 lb (69.4 kg)  09/24/19 150 lb (68 kg)   08/26/19 149 lb (67.6 kg)     GEN:  Well nourished, well developed in no acute distress HEENT: Normal NECK: No JVD; No carotid bruits LYMPHATICS: No lymphadenopathy CARDIAC: RRR, no murmurs, rubs, gallops RESPIRATORY:  Clear to auscultation without rales, wheezing or rhonchi  ABDOMEN: Soft, non-tender, non-distended MUSCULOSKELETAL:  No edema; No deformity  SKIN: Warm and dry NEUROLOGIC:  Alert and oriented x 3 PSYCHIATRIC:  Normal affect    Signed, Shirlee More, MD  09/27/2019 9:42 AM    [place]

## 2019-09-27 NOTE — Patient Instructions (Signed)
Medication Instructions:  Your physician recommends that you continue on your current medications as directed. Please refer to the Current Medication list given to you today.   *If you need a refill on your cardiac medications before your next appointment, please call your pharmacy*   Lab Work: Your physician recommends that you return for lab work in: TODAY CMP, Lipids, Aldolase If you have labs (blood work) drawn today and your tests are completely normal, you will receive your results only by: [name] MyChart Message (if you have MyChart) OR . A paper copy in the mail If you have any lab test that is abnormal or we need to change your treatment, we will call you to review the results.   Testing/Procedures: None   Follow-Up: At [facility], you and your health needs are our priority.  As part of our continuing mission to provide you with exceptional heart care, we have created designated Provider Care Teams.  These Care Teams include your primary Cardiologist (physician) and Advanced Practice Providers (APPs -  Physician Assistants and Nurse Practitioners) who all work together to provide you with the care you need, when you need it.  We recommend signing up for the patient portal called "MyChart".  Sign up information is provided on this After Visit Summary.  MyChart is used to connect with patients for Virtual Visits (Telemedicine).  Patients are able to view lab/test results, encounter notes, upcoming appointments, etc.  Non-urgent messages can be sent to your provider as well.   To learn more about what you can do with MyChart, go to [email or web address].    Your next appointment:   6 month(s)  The format for your next appointment:   In Person  Provider:   Norman Herrlich, MD   Other Instructions

## 2019-09-29 LAB — COMPREHENSIVE METABOLIC PANEL
ALT: 10 IU/L (ref 0–44)
AST: 9 IU/L (ref 0–40)
Albumin/Globulin Ratio: 1.6 (ref 1.2–2.2)
Albumin: 4.1 g/dL (ref 3.8–4.9)
Alkaline Phosphatase: 93 IU/L (ref 48–121)
BUN/Creatinine Ratio: 19 (ref 9–20)
BUN: 20 mg/dL (ref 6–24)
Bilirubin Total: 0.3 mg/dL (ref 0.0–1.2)
CO2: 22 mmol/L (ref 20–29)
Calcium: 9.7 mg/dL (ref 8.7–10.2)
Chloride: 100 mmol/L (ref 96–106)
Creatinine, Ser: 1.07 mg/dL (ref 0.76–1.27)
GFR calc Af Amer: 89 mL/min/{1.73_m2} (ref >59)
GFR calc non Af Amer: 77 mL/min/{1.73_m2} (ref >59)
Globulin, Total: 2.6 g/dL (ref 1.5–4.5)
Glucose: 338 mg/dL — ABNORMAL HIGH (ref 65–99)
Potassium: 5 mmol/L (ref 3.5–5.2)
Sodium: 136 mmol/L (ref 134–144)
Total Protein: 6.7 g/dL (ref 6.0–8.5)

## 2019-09-29 LAB — LIPID PANEL
Chol/HDL Ratio: 4 ratio (ref 0.0–5.0)
Cholesterol, Total: 180 mg/dL (ref 100–199)
HDL: 45 mg/dL (ref >39)
LDL Chol Calc (NIH): 103 mg/dL — ABNORMAL HIGH (ref 0–99)
Triglycerides: 187 mg/dL — ABNORMAL HIGH (ref 0–149)
VLDL Cholesterol Cal: 32 mg/dL (ref 5–40)

## 2019-09-29 LAB — ALDOLASE: Aldolase: 5.4 U/L (ref 3.3–10.3)

## 2019-09-30 ENCOUNTER — Telehealth: Payer: Self-pay

## 2019-09-30 NOTE — Telephone Encounter (Signed)
Spoke with patient regarding results and recommendation.  Patient verbalizes understanding and is agreeable to plan of care. Advised patient to call back with any issues or concerns.  

## 2019-10-05 ENCOUNTER — Other Ambulatory Visit: Payer: Self-pay | Admitting: Internal Medicine

## 2019-10-23 ENCOUNTER — Other Ambulatory Visit: Payer: Self-pay | Admitting: Internal Medicine

## 2019-10-23 ENCOUNTER — Telehealth: Payer: Self-pay | Admitting: Internal Medicine

## 2019-10-23 DIAGNOSIS — N529 Male erectile dysfunction, unspecified: Secondary | ICD-10-CM

## 2019-10-23 MED ORDER — TAMSULOSIN HCL 0.4 MG PO CAPS: 0.4000 mg | ORAL_CAPSULE | Freq: Every day | ORAL | 0 refills | Status: DC

## 2019-10-23 MED FILL — GABAPENTIN 300 MG CAPSULE: 300 | 30 days supply | Qty: 180 | Fill #1

## 2019-10-23 NOTE — Telephone Encounter (Signed)
1) Medication(s) Requested (by name):tamsulosin (FLOMAX) 0.4 MG CAPS capsule [[id number]] ENDED   2) Pharmacy of Choice:[facility] - [place], [place] - [address]  [address] [place], [place] [address]   3) Special Requests:   Approved medications will be sent to the pharmacy, we will reach out if there is an issue.  Requests made after 3pm may not be addressed until the following business day!  If a patient is unsure of the name of the medication(s) please note and ask patient to call back when they are able to provide all info, do not send to responsible party until all information is available!

## 2019-10-23 NOTE — Telephone Encounter (Signed)
Error

## 2019-10-24 MED FILL — TAMSULOSIN HCL 0.4 MG CAP: 0.4 | 30 days supply | Qty: 30 | Fill #0

## 2019-11-01 LAB — FECAL OCCULT BLOOD, IMMUNOCHEMICAL

## 2019-11-05 MED FILL — TAMSULOSIN HCL 0.4 MG CAP: 0.4 | 30 days supply | Qty: 30 | Fill #0

## 2019-11-05 MED FILL — GABAPENTIN 300 MG CAPSULE: 300 | 30 days supply | Qty: 180 | Fill #1

## 2019-11-06 ENCOUNTER — Telehealth (INDEPENDENT_AMBULATORY_CARE_PROVIDER_SITE_OTHER): Payer: Self-pay | Admitting: Internal Medicine

## 2019-11-06 ENCOUNTER — Encounter: Payer: Self-pay | Admitting: Internal Medicine

## 2019-11-06 DIAGNOSIS — IMO0002 Reserved for concepts with insufficient information to code with codable children: Secondary | ICD-10-CM

## 2019-11-06 DIAGNOSIS — E1165 Type 2 diabetes mellitus with hyperglycemia: Secondary | ICD-10-CM

## 2019-11-06 DIAGNOSIS — E118 Type 2 diabetes mellitus with unspecified complications: Secondary | ICD-10-CM

## 2019-11-06 MED ORDER — INSULIN GLARGINE 100 UNIT/ML SOLOSTAR PEN: PEN_INJECTOR | SUBCUTANEOUS | 11 refills | Status: DC

## 2019-11-06 MED ORDER — INSULIN ASPART 100 UNIT/ML FLEXPEN: 12.0000 [IU] | PEN_INJECTOR | Freq: Two times a day (BID) | SUBCUTANEOUS | 6 refills | Status: DC

## 2019-11-06 NOTE — Progress Notes (Signed)
Virtual Visit via Telephone Note  I connected with Dylan Anderson, on 11/06/2019 at 2:13 PM by telephone due to the COVID-19 pandemic and verified that I am speaking with the correct person using two identifiers.   Consent: I discussed the limitations, risks, security and privacy concerns of performing an evaluation and management service by telephone and the availability of in person appointments. I also discussed with the patient that there may be a patient responsible charge related to this service. The patient expressed understanding and agreed to proceed.   Location of Patient: Home   Location of Provider: Clinic    Persons participating in Telemedicine visit: Dylan Anderson [facility] Dr. Earlene Plater      History of Present Illness: Patient has a visit to follow up on T2DM. He was seen on 6/22 with A1c of 9.4%. Instructed to titrate Lantus slowly to possible dose of 30u BID based on CBGs. He is now at that dose. He is also taking Novolog 10u BID. Average fasting CBGs have been in the mid 200s. Has had hypoglycemic episode of CBG of 38 when he missed two meals in a row.   Reports he has been eating a lot of salads and baked fish. If he eats chicken or pork, it will be baked. No fried foods.    Past Medical History:  Diagnosis Date  . Acute gout involving toe of left foot 09/21/2017  . Arthritis   . Back pain   . Chest pain in adult 05/26/2018  . Chronic cough 07/13/2017  . Claudication (HCC)   . Diabetes mellitus type II, non insulin dependent (HCC) 07/10/2012  . Erectile dysfunction   . Gastroesophageal reflux disease 04/27/2017  . Hand pain 03/18/2017  . Healthcare maintenance 07/12/2017  . Hip pain 05/20/2017  . Hyperlipidemia 01/25/2017  . Hypertension 07/10/2012  . Insomnia 02/06/2017  . Low back pain 01/25/2017  . Peripheral artery disease (HCC) 06/07/2017  . PVD (peripheral vascular disease) (HCC)   . Tobacco abuse 07/10/2012  . Type 2 diabetes mellitus (HCC)    No Known  Allergies  Current Outpatient Medications on File Prior to Visit  Medication Sig Dispense Refill  . amLODipine (NORVASC) 5 MG tablet Take 1 tablet (5 mg total) by mouth daily. 90 tablet 1  . aspirin EC 81 MG tablet Take 1 tablet (81 mg total) by mouth daily. 90 tablet 3  . atorvastatin (LIPITOR) 80 MG tablet Take 1 tablet (80 mg total) by mouth daily at 6 PM. 90 tablet 2  . buPROPion (WELLBUTRIN SR) 200 MG 12 hr tablet Take 1 tablet (200 mg total) by mouth 2 (two) times daily. 180 tablet 0  . Evolocumab (REPATHA SURECLICK) 140 MG/ML SOAJ Inject 140 mg into the skin every 14 (fourteen) days. 2 pen 0  . fenofibrate (TRICOR) 48 MG tablet Take 1 tablet (48 mg total) by mouth daily. 90 tablet 2  . glucose blood (TRUE METRIX BLOOD GLUCOSE TEST) test strip Use as instructed. Check blood glucose level by fingerstick three times per day. E11.65 200 each 6  . insulin aspart (NOVOLOG) 100 UNIT/ML FlexPen Inject 10 Units into the skin 2 (two) times daily with a meal. 9 mL 6  . Insulin Glargine (LANTUS) 100 UNIT/ML Solostar Pen Inject 25 units in skin morning and inject 25 units in skin at bedtime. 15 mL 11  . Insulin Pen Needle (BD ULTRA-FINE PEN NEEDLES) 29G X 12.7MM MISC ICD10 E11.9 for use with insulin administration 400 each 2  . losartan (COZAAR)  100 MG tablet Take 1 tablet (100 mg total) by mouth daily. 90 tablet 1  . metFORMIN (GLUCOPHAGE) 500 MG tablet Take 2 tablets (1,000 mg total) by mouth 2 (two) times daily with a meal. 360 tablet 1  . metoprolol succinate (TOPROL-XL) 50 MG 24 hr tablet Take 1 tablet (50 mg total) by mouth at bedtime. 90 tablet 1  . pantoprazole (PROTONIX) 20 MG tablet Take 1 tablet (20 mg total) by mouth daily. 90 tablet 1  . pregabalin (LYRICA) 50 MG capsule Take 1 capsule (50 mg total) by mouth 3 (three) times daily. 90 capsule 1  . tamsulosin (FLOMAX) 0.4 MG CAPS capsule Take 1 capsule (0.4 mg total) by mouth daily. 90 capsule 0  . TRUEplus Lancets 28G MISC Use as  instructed. Check blood glucose level by fingerstick 3 times per day. E11.65 200 each 6  . nitroGLYCERIN (NITROSTAT) 0.4 MG SL tablet Place 1 tablet (0.4 mg total) under the tongue every 5 (five) minutes as needed for chest pain. (Patient not taking: Reported on 11/06/2019) 90 tablet 3   No current facility-administered medications on file prior to visit.    Observations/Objective: NAD. Speaking clearly.  Work of breathing normal.  Alert and oriented. Mood appropriate.   Assessment and Plan: 1. DM (diabetes mellitus), type 2, uncontrolled with complications (HCC) Still having fairly consistently elevated fasting CBGs. Hypoglycemic events have been few and limited to when he missed several meals. Discussed importance of maintaining consistent eating schedule with use of insulin. Given these couple episodes, will titrate insulin slowly. Increase Lantus from 30 u BID to 32 u BID. Increase Novolog from 10 u with meals to 12u with meals. Patient to notify for any CBGs <70. Follow up in 6 weeks with glucose log and for repeat A1c.  - insulin glargine (LANTUS) 100 UNIT/ML Solostar Pen; Inject 32 units in skin morning and inject 32 units in skin at bedtime.  Dispense: 15 mL; Refill: 11 - insulin aspart (NOVOLOG) 100 UNIT/ML FlexPen; Inject 12 Units into the skin 2 (two) times daily with a meal.  Dispense: 9 mL; Refill: 6   Follow Up Instructions: 6 weeks for T2DM    I discussed the assessment and treatment plan with the patient. The patient was provided an opportunity to ask questions and all were answered. The patient agreed with the plan and demonstrated an understanding of the instructions.   The patient was advised to call back or seek an in-person evaluation if the symptoms worsen or if the condition fails to improve as anticipated.     I provided 18 minutes total of non-face-to-face time during this encounter including median intraservice time, reviewing previous notes, investigations,  ordering medications, medical decision making, coordinating care and patient verbalized understanding at the end of the visit.    Marcy Siren, D.O. Primary Care at [facility]  11/06/2019, 2:13 PM

## 2019-11-22 ENCOUNTER — Other Ambulatory Visit: Payer: Self-pay

## 2019-11-22 ENCOUNTER — Encounter: Payer: Self-pay | Admitting: Internal Medicine

## 2019-11-22 ENCOUNTER — Ambulatory Visit (INDEPENDENT_AMBULATORY_CARE_PROVIDER_SITE_OTHER): Payer: Self-pay | Admitting: Internal Medicine

## 2019-11-22 VITALS — BP 144/73 | HR 63 | Temp 97.3°F | Resp 17 | Wt 150.0 lb

## 2019-11-22 DIAGNOSIS — E1165 Type 2 diabetes mellitus with hyperglycemia: Secondary | ICD-10-CM

## 2019-11-22 DIAGNOSIS — I10 Essential (primary) hypertension: Secondary | ICD-10-CM

## 2019-11-22 DIAGNOSIS — E1159 Type 2 diabetes mellitus with other circulatory complications: Secondary | ICD-10-CM

## 2019-11-22 DIAGNOSIS — Z794 Long term (current) use of insulin: Secondary | ICD-10-CM

## 2019-11-22 LAB — POCT GLYCOSYLATED HEMOGLOBIN (HGB A1C): Hemoglobin A1C: 9 % — AB (ref 4.0–5.6)

## 2019-11-22 LAB — GLUCOSE, POCT (MANUAL RESULT ENTRY): POC Glucose: 253 mg/dl — AB (ref 70–99)

## 2019-11-22 MED ORDER — METFORMIN HCL 1000 MG PO TABS: 1000.0000 mg | ORAL_TABLET | Freq: Two times a day (BID) | ORAL | 1 refills | Status: DC

## 2019-11-22 MED FILL — metFORMIN HCL 1000 MG TABS: 1000 | 30 days supply | Qty: 60 | Fill #0

## 2019-11-22 NOTE — Patient Instructions (Signed)
For every morning that your fasting blood sugar is >130, increase your insulin dose by 1 units. For example if tomorrow your blood sugar prior to eating breakfast is 160, you would take 33 units of insulin. If, the following day you blood sugar is 140 you would take 34 units of insulin. Once you have a fasting blood sugar <130 or if you reach Lantus 36 units daily, stop increasing your dose and continue to take the number of units you had most recently gotten up to. Please call for blood sugars less than 80.

## 2019-11-22 NOTE — Progress Notes (Signed)
Subjective:    Khylon Davies - 59 y.o. male MRN [id number]  Date of birth: [birth year]  HPI  Lavante Toso is here for follow up of chronic medical conditions.  Diabetes mellitus, Type 2 Disease Monitoring             Blood Sugar Ranges: Fasting - 150-180              Polyuria: no             Visual problems: no   Urine Microalbumin Significantly elevated. On Arb.   Last A1C: 9.4 (June 2021)   Medications: Lantus 32u, Novolog 12u with meals, Metformin 1000 mg BID   Medication Compliance: yes  Medication Side Effects             Hypoglycemia: no   Chronic HTN Disease Monitoring:  Home BP Monitoring - Does monitor at home but feels like numbers vary significantly at home---worried about tightness of cuff  Chest pain- no  Dyspnea- no Headache - no  Medications: Amlodipine 5 mg, Metoprolol 50, Losartan 100 mg  Compliance- yes Lightheadedness- no  Edema- no    Health Maintenance:  There are no preventive care reminders to display for this patient.  -  reports that he has been smoking cigarettes. He started smoking about 41 years ago. He has a 8.50 pack-year smoking history. He has never used smokeless tobacco. - Review of Systems: Per HPI. - Past Medical History: Patient Active Problem List   Diagnosis Date Noted  . Dizzy 09/21/2018  . Mild CAD 07/08/2018  . Abnormal cardiac CT angiography   . PVC's (premature ventricular contractions) 05/28/2018  . Chest pain in adult 05/26/2018  . Claudication (HCC) 11/05/2017  . Acute gout involving toe of left foot 09/21/2017  . Chronic cough 07/13/2017  . Healthcare maintenance 07/12/2017  . Peripheral artery disease (HCC) 06/07/2017  . Hip pain 05/20/2017  . Gastroesophageal reflux disease 04/27/2017  . Hand pain 03/18/2017  . Insomnia 02/06/2017  . Hyperlipidemia 01/25/2017  . Erectile dysfunction 01/25/2017  . Low back pain 01/25/2017  . Diabetes mellitus type II, non insulin dependent (HCC) 07/10/2012  .  Hypertension 07/10/2012  . Tobacco abuse 07/10/2012   - Medications: reviewed and updated   Objective:   Physical Exam BP (!) 144/73   Pulse 63   Temp (!) 97.3 F (36.3 C) (Temporal)   Resp 17   Wt 150 lb (68 kg)   SpO2 97%   BMI 21.52 kg/m  Physical Exam Constitutional:      General: He is not in acute distress.    Appearance: He is not diaphoretic.  HENT:     Head: Normocephalic and atraumatic.  Eyes:     Conjunctiva/sclera: Conjunctivae normal.  Cardiovascular:     Rate and Rhythm: Normal rate and regular rhythm.     Heart sounds: Normal heart sounds. No murmur heard.   Pulmonary:     Effort: Pulmonary effort is normal. No respiratory distress.     Breath sounds: Normal breath sounds.  Musculoskeletal:        General: Normal range of motion.  Skin:    General: Skin is warm and dry.  Neurological:     Mental Status: He is alert and oriented to person, place, and time.  Psychiatric:        Mood and Affect: Affect normal.        Judgment: Judgment normal.       Assessment & Plan:    1. Type  2 diabetes mellitus with other circulatory complication, with long-term current use of insulin (HCC) A1c 9.0, slightly improved from 9.4 in June. Fasting CBGs still above goal. Will increase Lantus by 1 unit for CBGs >130 in AM up to a potential total dose of 36 units. Return in 6 weeks with glucose log.  Counseled on Diabetic diet, my plate method, 709 minutes of moderate intensity exercise/week Blood sugar logs with fasting goals of 80-120 mg/dl, random of less than 295 and in the event of sugars less than 60 mg/dl or greater than 747 mg/dl encouraged to notify the clinic. Advised on the need for annual eye exams, annual foot exams, Pneumonia vaccine. - Glucose (CBG) - HgB A1c - metFORMIN (GLUCOPHAGE) 1000 MG tablet; Take 1 tablet (1,000 mg total) by mouth 2 (two) times daily with a meal.  Dispense: 180 tablet; Refill: 1  2. Essential hypertension BP slightly above goal.  Asymptomatic. Previously has had better control. Will continue to monitor closely.    Marcy Siren, D.O. 11/27/2019, 7:24 PM Primary Care at [facility]

## 2019-12-31 ENCOUNTER — Telehealth: Payer: Self-pay

## 2019-12-31 NOTE — Telephone Encounter (Signed)
Returned a phone call from automated system.

## 2020-01-03 ENCOUNTER — Other Ambulatory Visit: Payer: Self-pay

## 2020-01-03 ENCOUNTER — Telehealth (INDEPENDENT_AMBULATORY_CARE_PROVIDER_SITE_OTHER): Payer: Self-pay | Admitting: Internal Medicine

## 2020-01-03 ENCOUNTER — Encounter: Payer: Self-pay | Admitting: Internal Medicine

## 2020-01-03 ENCOUNTER — Other Ambulatory Visit: Payer: Self-pay | Admitting: Internal Medicine

## 2020-01-03 DIAGNOSIS — E118 Type 2 diabetes mellitus with unspecified complications: Secondary | ICD-10-CM

## 2020-01-03 DIAGNOSIS — IMO0002 Reserved for concepts with insufficient information to code with codable children: Secondary | ICD-10-CM

## 2020-01-03 DIAGNOSIS — E1165 Type 2 diabetes mellitus with hyperglycemia: Secondary | ICD-10-CM

## 2020-01-03 DIAGNOSIS — E1142 Type 2 diabetes mellitus with diabetic polyneuropathy: Secondary | ICD-10-CM

## 2020-01-03 MED ORDER — INSULIN GLARGINE 100 UNIT/ML SOLOSTAR PEN: PEN_INJECTOR | SUBCUTANEOUS | 11 refills | Status: DC

## 2020-01-03 MED ORDER — PREGABALIN 75 MG PO CAPS: 75.0000 mg | ORAL_CAPSULE | Freq: Three times a day (TID) | ORAL | 0 refills | Status: DC

## 2020-01-03 MED FILL — PREGABALIN 75 MG CAPS: 75 | 30 days supply | Qty: 90 | Fill #0

## 2020-01-03 NOTE — Progress Notes (Signed)
Virtual Visit via Telephone Note  I connected with Dylan Anderson, on 01/03/2020 at 11:17 AM by telephone due to the COVID-19 pandemic and verified that I am speaking with the correct person using two identifiers.   Consent: I discussed the limitations, risks, security and privacy concerns of performing an evaluation and management service by telephone and the availability of in person appointments. I also discussed with the patient that there may be a patient responsible charge related to this service. The patient expressed understanding and agreed to proceed.   Location of Patient: Home   Location of Provider: Clinic    Persons participating in Telemedicine visit: Desmund Whitfield [place] [facility] Dr. Earlene Plater      History of Present Illness: Patient has a visit to follow up on T2DM.   Diabetes mellitus, Type 2 Disease Monitoring             Blood Sugar Ranges: Fasting - 180-210;  Random - 240-250.             Polyuria: no              Visual problems: no   Urine Microalbumin Significantly elevated. On Arb.   Last A1C: 9.0 (Aug 2021)   Medications: Lantus 34u BID, Novolog 12u TID, Metformin 1000 mg BID  Medication Compliance: yes  Medication Side Effects             Hypoglycemia: no     Past Medical History:  Diagnosis Date  . Acute gout involving toe of left foot 09/21/2017  . Arthritis   . Back pain   . Chest pain in adult 05/26/2018  . Chronic cough 07/13/2017  . Claudication (HCC)   . Diabetes mellitus type II, non insulin dependent (HCC) 07/10/2012  . Erectile dysfunction   . Gastroesophageal reflux disease 04/27/2017  . Hand pain 03/18/2017  . Healthcare maintenance 07/12/2017  . Hip pain 05/20/2017  . Hyperlipidemia 01/25/2017  . Hypertension 07/10/2012  . Insomnia 02/06/2017  . Low back pain 01/25/2017  . Peripheral artery disease (HCC) 06/07/2017  . PVD (peripheral vascular disease) (HCC)   . Tobacco abuse 07/10/2012  . Type 2 diabetes mellitus (HCC)    No Known  Allergies  Current Outpatient Medications on File Prior to Visit  Medication Sig Dispense Refill  . amLODipine (NORVASC) 5 MG tablet Take 1 tablet (5 mg total) by mouth daily. 90 tablet 1  . aspirin EC 81 MG tablet Take 1 tablet (81 mg total) by mouth daily. 90 tablet 3  . atorvastatin (LIPITOR) 80 MG tablet Take 1 tablet (80 mg total) by mouth daily at 6 PM. 90 tablet 2  . buPROPion (WELLBUTRIN SR) 200 MG 12 hr tablet Take 1 tablet (200 mg total) by mouth 2 (two) times daily. 180 tablet 0  . Evolocumab (REPATHA SURECLICK) 140 MG/ML SOAJ Inject 140 mg into the skin every 14 (fourteen) days. 2 pen 0  . fenofibrate (TRICOR) 48 MG tablet Take 1 tablet (48 mg total) by mouth daily. 90 tablet 2  . glucose blood (TRUE METRIX BLOOD GLUCOSE TEST) test strip Use as instructed. Check blood glucose level by fingerstick three times per day. E11.65 200 each 6  . insulin aspart (NOVOLOG) 100 UNIT/ML FlexPen Inject 12 Units into the skin 2 (two) times daily with a meal. 9 mL 6  . insulin glargine (LANTUS) 100 UNIT/ML Solostar Pen Inject 32 units in skin morning and inject 32 units in skin at bedtime. 15 mL 11  . Insulin Pen  Needle (BD ULTRA-FINE PEN NEEDLES) 29G X 12.7MM MISC ICD10 E11.9 for use with insulin administration 400 each 2  . losartan (COZAAR) 100 MG tablet Take 1 tablet (100 mg total) by mouth daily. 90 tablet 1  . metFORMIN (GLUCOPHAGE) 1000 MG tablet Take 1 tablet (1,000 mg total) by mouth 2 (two) times daily with a meal. 180 tablet 1  . metoprolol succinate (TOPROL-XL) 50 MG 24 hr tablet Take 1 tablet (50 mg total) by mouth at bedtime. 90 tablet 1  . nitroGLYCERIN (NITROSTAT) 0.4 MG SL tablet Place 1 tablet (0.4 mg total) under the tongue every 5 (five) minutes as needed for chest pain. 90 tablet 3  . pantoprazole (PROTONIX) 20 MG tablet Take 1 tablet (20 mg total) by mouth daily. 90 tablet 1  . pregabalin (LYRICA) 50 MG capsule Take 1 capsule (50 mg total) by mouth 3 (three) times daily. 90  capsule 1  . tamsulosin (FLOMAX) 0.4 MG CAPS capsule Take 1 capsule (0.4 mg total) by mouth daily. 90 capsule 0  . TRUEplus Lancets 28G MISC Use as instructed. Check blood glucose level by fingerstick 3 times per day. E11.65 200 each 6   No current facility-administered medications on file prior to visit.    Observations/Objective: NAD. Speaking clearly.  Work of breathing normal.  Alert and oriented. Mood appropriate.   Assessment and Plan: 1. DM (diabetes mellitus), type 2, uncontrolled with complications (HCC) Last A1c with poor glycemic control with result of 9.0 in Aug 2021. Fasting CBGs are elevated and patient has not had any episodes of hypoglycemia. Will increase Lantus from 34 to 38u BID. Counseled to monitor CBGs and given parameters for alerting clinic prior to follow up. Return in 6 weeks.  Counseled on Diabetic diet, my plate method, 938 minutes of moderate intensity exercise/week Blood sugar logs with fasting goals of 80-120 mg/dl, random of less than 182 and in the event of sugars less than 60 mg/dl or greater than 993 mg/dl encouraged to notify the clinic. Advised on the need for annual eye exams, annual foot exams, Pneumonia vaccine. - insulin glargine (LANTUS) 100 UNIT/ML Solostar Pen; Inject 38 units in skin morning and inject 38 units in skin at bedtime.  Dispense: 15 mL; Refill: 11  2. Diabetic polyneuropathy associated with type 2 diabetes mellitus (HCC) Increase Lyrica dose to try to improve DM neuropathy symptoms.  - pregabalin (LYRICA) 75 MG capsule; Take 1 capsule (75 mg total) by mouth 3 (three) times daily.  Dispense: 270 capsule; Refill: 0   Follow Up Instructions: 6 week f/u DM    I discussed the assessment and treatment plan with the patient. The patient was provided an opportunity to ask questions and all were answered. The patient agreed with the plan and demonstrated an understanding of the instructions.   The patient was advised to call back or seek  an in-person evaluation if the symptoms worsen or if the condition fails to improve as anticipated.     I provided 24 minutes total of non-face-to-face time during this encounter including median intraservice time, reviewing previous notes, investigations, ordering medications, medical decision making, coordinating care and patient verbalized understanding at the end of the visit.    Marcy Siren, D.O. Primary Care at [facility]  01/03/2020, 11:17 AM

## 2020-01-07 MED FILL — ?BASAGLAR 100 UNITS/ML KWPE: 100 | 30 days supply | Qty: 15 | Fill #2

## 2020-01-07 MED FILL — TAMSULOSIN HCL 0.4 MG CAP: 0.4 | 30 days supply | Qty: 30 | Fill #1

## 2020-01-07 MED FILL — GABAPENTIN 300 MG CAPSULE: 300 | 30 days supply | Qty: 180 | Fill #2

## 2020-01-07 MED FILL — $novoLOG FLEXPEN SYRINGE: 100 | 30 days supply | Qty: 6 | Fill #2

## 2020-01-22 ENCOUNTER — Other Ambulatory Visit: Payer: Self-pay | Admitting: Cardiology

## 2020-01-22 MED FILL — PANTOPRAZOLE SOD DR 20 MG T: 20 | 30 days supply | Qty: 30 | Fill #1

## 2020-01-22 MED FILL — FENOFIBRATE 48 MG TABLET: 48 | 30 days supply | Qty: 30 | Fill #2

## 2020-01-22 MED FILL — ?METOPROLOL SUCC ER 50M TAB: 50 | 30 days supply | Qty: 30 | Fill #2

## 2020-01-22 MED FILL — BUPROPION SR 150 MG TABLET: 150 | 30 days supply | Qty: 60 | Fill #2

## 2020-01-22 MED FILL — TAMSULOSIN HCL 0.4 MG CAP: 0.4 | 30 days supply | Qty: 30 | Fill #2

## 2020-01-22 MED FILL — ATORVASTATIN CALCIUM 80 MG: 80 | 30 days supply | Qty: 30 | Fill #2

## 2020-01-22 MED FILL — METFORMIN HCL 500 MG TABS: 500 | 30 days supply | Qty: 120 | Fill #2

## 2020-01-22 MED FILL — ?AMLODIPINE BESYLATE 5MG TA: 5 | 30 days supply | Qty: 30 | Fill #2

## 2020-01-22 MED FILL — PREGABALIN 50 MG CAPS: 50 | 30 days supply | Qty: 90 | Fill #1

## 2020-01-22 MED FILL — LOSARTAN POTASSIUM 100 MG T: 100 | 30 days supply | Qty: 30 | Fill #2

## 2020-02-14 ENCOUNTER — Other Ambulatory Visit: Payer: Self-pay

## 2020-02-14 DIAGNOSIS — Z20822 Contact with and (suspected) exposure to covid-19: Secondary | ICD-10-CM

## 2020-02-15 LAB — SARS-COV-2, NAA 2 DAY TAT

## 2020-02-15 LAB — NOVEL CORONAVIRUS, NAA: SARS-CoV-2, NAA: NOT DETECTED

## 2020-02-21 ENCOUNTER — Other Ambulatory Visit: Payer: Self-pay

## 2020-02-21 ENCOUNTER — Other Ambulatory Visit: Payer: Self-pay | Admitting: Internal Medicine

## 2020-02-21 DIAGNOSIS — N529 Male erectile dysfunction, unspecified: Secondary | ICD-10-CM

## 2020-02-21 MED ORDER — TAMSULOSIN HCL 0.4 MG PO CAPS: 0.4000 mg | ORAL_CAPSULE | Freq: Every day | ORAL | 0 refills | Status: DC

## 2020-02-21 MED FILL — TAMSULOSIN HCL 0.4 MG CAP: 0.4 | 30 days supply | Qty: 30 | Fill #0

## 2020-02-25 ENCOUNTER — Encounter: Payer: Self-pay | Admitting: Internal Medicine

## 2020-02-25 ENCOUNTER — Ambulatory Visit (INDEPENDENT_AMBULATORY_CARE_PROVIDER_SITE_OTHER): Payer: Self-pay | Admitting: Internal Medicine

## 2020-02-25 ENCOUNTER — Other Ambulatory Visit: Payer: Self-pay

## 2020-02-25 VITALS — BP 135/88 | HR 92 | Temp 97.3°F | Resp 17 | Wt 147.0 lb

## 2020-02-25 DIAGNOSIS — I10 Essential (primary) hypertension: Secondary | ICD-10-CM

## 2020-02-25 DIAGNOSIS — E1142 Type 2 diabetes mellitus with diabetic polyneuropathy: Secondary | ICD-10-CM

## 2020-02-25 DIAGNOSIS — E782 Mixed hyperlipidemia: Secondary | ICD-10-CM

## 2020-02-25 DIAGNOSIS — E1159 Type 2 diabetes mellitus with other circulatory complications: Secondary | ICD-10-CM

## 2020-02-25 DIAGNOSIS — Z794 Long term (current) use of insulin: Secondary | ICD-10-CM

## 2020-02-25 LAB — POCT GLYCOSYLATED HEMOGLOBIN (HGB A1C): Hemoglobin A1C: 11 % — AB (ref 4.0–5.6)

## 2020-02-25 LAB — GLUCOSE, POCT (MANUAL RESULT ENTRY): POC Glucose: 356 mg/dl — AB (ref 70–99)

## 2020-02-25 NOTE — Progress Notes (Signed)
Subjective:    Dylan Anderson - 59 y.o. male MRN [id number]  Date of birth: 16-Sep-1960  HPI  Dylan Anderson is here for follow up of chronic medical conditions.  Diabetes mellitus, Type 2 Disease Monitoring             Blood Sugar Ranges: Fasting - 250-290s. Reports when insulin dose was increased at last visit, was temporarily in 150s fasting.              Polyuria: yes              Visual problems: no   Urine Microalbumin Significantly elevated. On Arb.  Last A1C: 9.0 (Aug 2021)   Medications: Lantus 38u BID, Novolog 12u TID, Metformin 1000 mg BID  Medication Compliance: yes  Medication Side Effects             Hypoglycemia: yes, glucose 38 once since last visit   Chronic HTN Disease Monitoring:  Home BP Monitoring - Does not monitor  Chest pain- no  Dyspnea- no Headache no  Medications: Amlodipine 5 mg, Losartan 100 mg, Metoprolol 50 mg XL Compliance- yes Lightheadedness- no  Edema- no       Health Maintenance:  There are no preventive care reminders to display for this patient.  -  reports that he has been smoking cigarettes. He started smoking about 41 years ago. He has a 8.50 pack-year smoking history. He has never used smokeless tobacco. - Review of Systems: Per HPI. - Past Medical History: Patient Active Problem List   Diagnosis Date Noted  . Dizzy 09/21/2018  . Mild CAD 07/08/2018  . Abnormal cardiac CT angiography   . PVC's (premature ventricular contractions) 05/28/2018  . Claudication (HCC) 11/05/2017  . Chronic cough 07/13/2017  . Peripheral artery disease (HCC) 06/07/2017  . Hip pain 05/20/2017  . Gastroesophageal reflux disease 04/27/2017  . Insomnia 02/06/2017  . Hyperlipidemia 01/25/2017  . Erectile dysfunction 01/25/2017  . Diabetes mellitus type II, non insulin dependent (HCC) 07/10/2012  . Hypertension 07/10/2012  . Tobacco abuse 07/10/2012   - Medications: reviewed and updated   Objective:   Physical Exam BP 135/88    Pulse 92   Temp (!) 97.3 F (36.3 C) (Temporal)   Resp 17   Wt 147 lb (66.7 kg)   SpO2 95%   BMI 21.09 kg/m  Physical Exam Constitutional:      General: He is not in acute distress.    Appearance: He is not diaphoretic.  Cardiovascular:     Rate and Rhythm: Normal rate.  Pulmonary:     Effort: Pulmonary effort is normal. No respiratory distress.  Musculoskeletal:        General: Normal range of motion.  Skin:    General: Skin is warm and dry.  Neurological:     Mental Status: He is alert and oriented to person, place, and time.  Psychiatric:        Mood and Affect: Affect normal.        Judgment: Judgment normal.            Assessment & Plan:   1. Type 2 diabetes mellitus with other circulatory complication, with long-term current use of insulin (HCC) A1c with worsened trend 9.4>11 despite increase in Lantus dose. Patient reports compliance with medications. No known significant dietary triggers were able to be determined. Given poor glucose control, will have patient follow up with Butch Penny, PharmD ASAP. No changes to regimen today.  Counseled on Diabetic diet, my  plate method, 859 minutes of moderate intensity exercise/week Blood sugar logs with fasting goals of 80-120 mg/dl, random of less than 276 and in the event of sugars less than 60 mg/dl or greater than 394 mg/dl encouraged to notify the clinic. Advised on the need for annual eye exams, annual foot exams, Pneumonia vaccine. - Glucose (CBG) - HgB A1c  2. Essential hypertension BP at goal. Continue current medication regimen. Asymptomatic.   3. Diabetic polyneuropathy associated with type 2 diabetes mellitus (HCC) Continue Lyrica.   4. Mixed hyperlipidemia Continue Lipitor, Fenofibrate, ASA.      Marcy Siren, D.O. 02/25/2020, 10:34 AM Primary Care at [facility]

## 2020-03-05 DIAGNOSIS — I739 Peripheral vascular disease, unspecified: Secondary | ICD-10-CM | POA: Insufficient documentation

## 2020-03-05 DIAGNOSIS — M549 Dorsalgia, unspecified: Secondary | ICD-10-CM | POA: Insufficient documentation

## 2020-03-05 DIAGNOSIS — E119 Type 2 diabetes mellitus without complications: Secondary | ICD-10-CM | POA: Insufficient documentation

## 2020-03-05 DIAGNOSIS — M1990 Unspecified osteoarthritis, unspecified site: Secondary | ICD-10-CM | POA: Insufficient documentation

## 2020-03-10 ENCOUNTER — Other Ambulatory Visit: Payer: Self-pay

## 2020-03-10 ENCOUNTER — Other Ambulatory Visit: Payer: Self-pay | Admitting: Family Medicine

## 2020-03-10 ENCOUNTER — Encounter: Payer: Self-pay | Admitting: Pharmacist

## 2020-03-10 ENCOUNTER — Ambulatory Visit: Payer: Self-pay | Attending: Internal Medicine | Admitting: Pharmacist

## 2020-03-10 DIAGNOSIS — IMO0002 Reserved for concepts with insufficient information to code with codable children: Secondary | ICD-10-CM

## 2020-03-10 DIAGNOSIS — E118 Type 2 diabetes mellitus with unspecified complications: Secondary | ICD-10-CM

## 2020-03-10 DIAGNOSIS — E1165 Type 2 diabetes mellitus with hyperglycemia: Secondary | ICD-10-CM

## 2020-03-10 MED ORDER — INSULIN GLARGINE 100 UNIT/ML SOLOSTAR PEN: 70.0000 [IU] | PEN_INJECTOR | Freq: Every day | SUBCUTANEOUS | 2 refills | Status: DC

## 2020-03-10 MED ORDER — VICTOZA 18 MG/3ML ~~LOC~~ SOPN: PEN_INJECTOR | SUBCUTANEOUS | 1 refills | Status: DC

## 2020-03-10 MED FILL — $novoLOG FLEXPEN SYRINGE: 100 | 30 days supply | Qty: 6 | Fill #3

## 2020-03-10 MED FILL — VICTOZA 2-PAK 18 MG/3 ML PE: 18 | 27 days supply | Qty: 6 | Fill #0

## 2020-03-10 MED FILL — ?BASAGLAR 100 UNITS/ML KWPE: 100 | 30 days supply | Qty: 21 | Fill #0

## 2020-03-10 NOTE — Progress Notes (Signed)
S:    PCP: Dr. Earlene Plater   Patient arrives in good spirits.  Presents for diabetes evaluation, education, and management. Patient was referred and last seen by Primary Care Provider on 02/25/2020. A1c resulted at that visit (11.0). Additionally, pt endorsed elevated home CBGs with polyuria.   Patient reports Diabetes was diagnosed in 2013. Denies hx of pancreatitis. Has been on insulin for a while now. No fhx or personal hx of thyroid cancer.   Family/Social History:  - FHx: HF, stroke, MI  - Current 0.5 PPD smoker - Alcohol: denies use  Insurance coverage/medication affordability: self pay  Medication adherence reported.   Current diabetes medications include: metformin 1000 mg BID, Lantus 38 units BID, Novolog 12 units BID Current hypertension medications include: amlodipine 5 mg daily, losartan 100 mg daily, Toprol XL 50 mg daily  Current hyperlipidemia medications include: atorvastatin 80 mg daily, Repatha 140 mg q14days, fenofibrate 48 mg daily   Patient reports occasional hypoglycemic events. Often occurs midday.   Patient reported dietary habits: Eats 2-3 meals/day - Mostly fruits and salads - Will occasionally consume rice and red meat - Proteins: mostly baked chicken or fish   Patient-reported exercise habits:  - Works daily in the yard    Patient reports nocturia (nighttime urination).  Patient denies neuropathy (nerve pain). Patient reports visual changes. Patient reports self foot exams.     O:  Physical Exam   ROS   Lab Results  Component Value Date   HGBA1C 11.0 (A) 02/25/2020   There were no vitals filed for this visit.  Lipid Panel     Component Value Date/Time   CHOL 180 09/27/2019 0000   TRIG 187 (H) 09/27/2019 0000   HDL 45 09/27/2019 0000   CHOLHDL 4.0 09/27/2019 0000   CHOLHDL 5.7 07/10/2012 1301   VLDL 36 07/10/2012 1301   LDLCALC 103 (H) 09/27/2019 0000    Home fasting blood sugars: endorses 200-250s +; this morning was in the  300s.    Clinical Atherosclerotic Cardiovascular Disease (ASCVD): Yes - CAD?  The 10-year ASCVD risk score Denman George DC Montez Hageman., et al., 2013) is: 27.3%   Values used to calculate the score:     Age: 59 years     Sex: Male     Is Non-Hispanic African American: No     Diabetic: Yes     Tobacco smoker: Yes     Systolic Blood Pressure: 135 mmHg     Is BP treated: Yes     HDL Cholesterol: 45 mg/dL     Total Cholesterol: 180 mg/dL    A/P: Diabetes longstanding currently uncontrolled. Patient is able to verbalize appropriate hypoglycemia management plan. Medication adherence appears appropriate.   GLP-1 RA appropriate in this case. Pt has hx of PVD but also hx of mild, stable CAD. Victoza will offer cardiac benefit. Additionally, it may lower pt's exogenous insulin requirement.  -Started Victoza 0.6 mg daily. Pt given intstructions to increase to 1.2 mg daily in 1 week, and then to increase further to 1.8 mg daily after one week of the 1.2 mg dose.  -Adjusted dose of  Lantus to 70 units once daily. Pt was on a TDD of 76 (taken as 38u BID). Decrease made because of Victoza start.  -Continue Novolog and metformin.  -Extensively discussed pathophysiology of diabetes, recommended lifestyle interventions, dietary effects on blood sugar control -Counseled on s/sx of and management of hypoglycemia -Next A1C anticipated 05/2020.   Written patient instructions provided. Total time in  face to face counseling 30 minutes.   Follow up Pharmacist Clinic Visit in 3-4 weeks.    Butch Penny, PharmD, P[place]y [place], CPP Clinical Pharmacist [facility] [phone]

## 2020-03-11 ENCOUNTER — Other Ambulatory Visit: Payer: Self-pay | Admitting: Internal Medicine

## 2020-03-17 NOTE — Progress Notes (Signed)
Cardiology Office Note:    Date:  03/18/2020   ID:  Dylan IdaBill Anderson, DOB Nov 22, 1960, MRN [id number]  PCP:  Arvilla MarketWallace, Catherine Lauren, DO  Cardiologist:  Norman HerrlichBrian Brynlei Klausner, MD    Referring MD: Leary RocaWallace, Catherine Laur*    ASSESSMENT:    1. Coronary artery disease of native artery of native heart with stable angina pectoris (HCC)   2. Essential hypertension   3. Mixed hyperlipidemia   4. Diabetes mellitus type II, non insulin dependent (HCC)   5. PVC's (premature ventricular contractions)   6. Current every day smoker   7. PAOD (peripheral arterial occlusive disease) (HCC)    PLAN:    In order of problems listed above:  1. Overall from a cardiovascular perspective he is doing well having no angina on current medical therapy complaint continue antiplatelet aspirin high intensity statin plus Repatha check lipid profile CMP today along with beta-blocker. 2. At target continue current treatment he takes ARB with diabetes 3. Continue intense therapy statin plus PCSK9 inhibitor check labs today he may need a third agent Zetia 4. Improved with the addition of Victoza I think in his case an A1c of 7-7.5 would be optimal. 5. Stable asymptomatic he is on a beta-blocker 6. Committed to smoking cessation counseled given a prescription for nicotine patch  7. Stable followed by vascular surgery may need a repeat aortoiliac duplex but his symptoms sound more pseudoclaudication than claudication.   Next appointment: 6 months   Medication Adjustments/Labs and Tests Ordered: Current medicines are reviewed at length with the patient today.  Concerns regarding medicines are outlined above.  Orders Placed This Encounter  Procedures  . Comprehensive metabolic panel  . Lipid panel   Meds ordered this encounter  Medications  . nicotine (NICODERM CQ - DOSED IN MG/24 HOURS) 14 mg/24hr patch    Sig: Place 1 patch (14 mg total) onto the skin daily.    Dispense:  30 patch    Refill:  0  . nitroGLYCERIN  (NITROSTAT) 0.4 MG SL tablet    Sig: Place 1 tablet (0.4 mg total) under the tongue every 5 (five) minutes as needed for chest pain.    Dispense:  90 tablet    Refill:  3    No chief complaint on file.   History of Present Illness:    Dylan Anderson is a 59 y.o. male with a hx of  CAD, HTN, DLD, PVC"s. Left heart  cath 06/19/18 showed mild diffuse CAD with focal 80% ostial proximal narrowing of the ramus branch which was small in caliber and  treated medically.  He was last seen 09/27/2019.  Compliance with diet, lifestyle and medications:   Yes, he has had all 3 doses of Pfizer vaccine and he is down to less than 1/2 pack of cigarettes and committed to smoking cessation.  He has a pattern of lower extremity claudication or pseudoclaudication that limits him he also has exertional shortness of breath and some cough and wheezing.  He has had no anginal discomfort palpitations syncope and tolerates combined high intensity statin and PCSK9 inhibitor.  He was recently seen by an endocrinologist with an A1c of 11 his blood sugars are now running in the range of 150 mg percent and he is started on Victoza.  He tolerates his statin without muscle pain or weakness.  His last lipid profile was June 2021  Previous vascular studies 02/25/2019 showed normal ABI bilaterally He had lower extremity vascular studies performed showing no focal stenosis and history of  left common iliac artery stent by vascular surgery. He is on a high intensity statin and PCSK9: Component Ref Range & Units 5 mo ago  (09/27/19) 1 yr ago  (03/19/19) 1 yr ago  (01/16/19) 1 yr ago  (09/21/18) 2 yr ago  (02/08/18) 3 yr ago  (01/13/17) 7 yr ago  (07/10/12)  Cholesterol, Total 100 - 199 mg/dL 007  622QJFH  545GYBW  253High  232High  216High    Triglycerides 0 - 149 mg/dL 389HTDS  287GOTL  572IOMB  408High  155High  83  179High R   HDL >39 mg/dL 45  41  46  55HRC  45  50 CM  39Low   VLDL Cholesterol Cal 5 - 40  mg/dL 32  16LAGT  32  Comment CM  31  17    LDL Chol Calc (NIH) 0 - 99 mg/dL 364WOEH  212YQMG  500BBCW       Chol/HDL Ratio 0.0 - 5.0 ratio 4.0  6.8High CM  4.7 CM  6.8High CM  5.2High CM  4.3 CM  5.7     Past Medical History:  Diagnosis Date  . Acute gout involving toe of left foot 09/21/2017  . Arthritis   . Back pain   . Chest pain in adult 05/26/2018  . Chronic cough 07/13/2017  . Claudication (HCC)   . Diabetes mellitus type II, non insulin dependent (HCC) 07/10/2012  . Erectile dysfunction   . Gastroesophageal reflux disease 04/27/2017  . Hand pain 03/18/2017  . Healthcare maintenance 07/12/2017  . Hip pain 05/20/2017  . Hyperlipidemia 01/25/2017  . Hypertension 07/10/2012  . Insomnia 02/06/2017  . Low back pain 01/25/2017  . Peripheral artery disease (HCC) 06/07/2017  . PVD (peripheral vascular disease) (HCC)   . Tobacco abuse 07/10/2012  . Type 2 diabetes mellitus (HCC)     Past Surgical History:  Procedure Laterality Date  . ILIAC ARTERY STENT    . LEFT HEART CATH AND CORONARY ANGIOGRAPHY N/A 06/19/2018   Procedure: LEFT HEART CATH AND CORONARY ANGIOGRAPHY;  Surgeon: Lennette Bihari, MD;  Location: [facility];  Service: Cardiovascular;  Laterality: N/A;  . stent Left    left common and left internal illiac stent    Current Medications: Current Meds  Medication Sig  . amLODipine (NORVASC) 5 MG tablet Take 1 tablet (5 mg total) by mouth daily.  [name] aspirin EC 81 MG tablet Take 1 tablet (81 mg total) by mouth daily.  [name] atorvastatin (LIPITOR) 80 MG tablet Take 1 tablet (80 mg total) by mouth daily at 6 PM.  . buPROPion (WELLBUTRIN SR) 200 MG 12 hr tablet Take 1 tablet (200 mg total) by mouth 2 (two) times daily.  . Evolocumab (REPATHA SURECLICK) 140 MG/ML SOAJ Inject 140 mg into the skin every 14 (fourteen) days.  . fenofibrate (TRICOR) 48 MG tablet Take 1 tablet (48 mg total) by mouth daily.  [name] glucose blood (TRUE METRIX BLOOD GLUCOSE TEST) test strip Use as  instructed. Check blood glucose level by fingerstick three times per day. E11.65  . insulin aspart (NOVOLOG) 100 UNIT/ML FlexPen Inject 12 Units into the skin 2 (two) times daily with a meal.  . insulin glargine (LANTUS) 100 UNIT/ML Solostar Pen Inject 70 Units into the skin daily. Inject 38 units in skin morning and inject 38 units in skin at bedtime.  . Insulin Pen Needle (BD ULTRA-FINE PEN NEEDLES) 29G X 12.7MM MISC ICD10 E11.9 for use with insulin administration  . liraglutide (VICTOZA) 18  MG/3ML SOPN Start 0.6mg  SQ daily x7days, then 1.2mg  daily x7 days, then 1.8mg  daily thereafter.  . losartan (COZAAR) 100 MG tablet Take 1 tablet (100 mg total) by mouth daily.  . metFORMIN (GLUCOPHAGE) 1000 MG tablet Take 1 tablet (1,000 mg total) by mouth 2 (two) times daily with a meal.  . metoprolol succinate (TOPROL-XL) 50 MG 24 hr tablet Take 1 tablet (50 mg total) by mouth at bedtime.  . nitroGLYCERIN (NITROSTAT) 0.4 MG SL tablet Place 1 tablet (0.4 mg total) under the tongue every 5 (five) minutes as needed for chest pain.  . pantoprazole (PROTONIX) 20 MG tablet Take 1 tablet (20 mg total) by mouth daily.  . pregabalin (LYRICA) 75 MG capsule Take 1 capsule (75 mg total) by mouth 3 (three) times daily.  . tamsulosin (FLOMAX) 0.4 MG CAPS capsule Take 1 capsule (0.4 mg total) by mouth daily.  . TRUEplus Lancets 28G MISC Use as instructed. Check blood glucose level by fingerstick 3 times per day. E11.65  . [DISCONTINUED] nitroGLYCERIN (NITROSTAT) 0.4 MG SL tablet Place 1 tablet (0.4 mg total) under the tongue every 5 (five) minutes as needed for chest pain.     Allergies:   Patient has no known allergies.   Social History   Socioeconomic History  . Marital status: Legally Separated    Spouse name: Not on file  . Number of children: Not on file  . Years of education: Not on file  . Highest education level: Not on file  Occupational History  . Occupation: unemployed  Tobacco Use  . Smoking status:  Current Every Day Smoker    Packs/day: 0.50    Years: 17.00    Pack years: 8.50    Types: Cigarettes    Start date: 04/04/1978  . Smokeless tobacco: Never Used  . Tobacco comment: Max 1 ppd  Vaping Use  . Vaping Use: Never used  Substance and Sexual Activity  . Alcohol use: Yes    Comment: 2-3 mixed drinks 3 times per week  . Drug use: No  . Sexual activity: Yes    Partners: Female  Other Topics Concern  . Not on file  Social History Narrative  . Not on file   Social Determinants of Health   Financial Resource Strain: Not on file  Food Insecurity: Not on file  Transportation Needs: Not on file  Physical Activity: Not on file  Stress: Not on file  Social Connections: Not on file     Family History: The patient's family history includes COPD in his sister; Heart attack in his maternal aunt and maternal uncle; Heart failure in his mother; Osteoarthritis in his mother; Stroke in his sister. ROS:   Please see the history of present illness.    All other systems reviewed and are negative.  EKGs/Labs/Other Studies Reviewed:    The following studies were reviewed today:  EKG: His EKG in June showed sinus rhythm T wave abnormality  Recent Labs: 09/27/2019: ALT 10; BUN 20; Creatinine, Ser 1.07; Potassium 5.0; Sodium 136  Recent Lipid Panel    Component Value Date/Time   CHOL 180 09/27/2019 0000   TRIG 187 (H) 09/27/2019 0000   HDL 45 09/27/2019 0000   CHOLHDL 4.0 09/27/2019 0000   CHOLHDL 5.7 07/10/2012 1301   VLDL 36 07/10/2012 1301   LDLCALC 103 (H) 09/27/2019 0000    Physical Exam:    VS:  BP 128/60   Pulse 80   Ht 5\' 10"  (1.778 m)   Wt 155  lb 3.2 oz (70.4 kg)   SpO2 97%   BMI 22.27 kg/m     Wt Readings from Last 3 Encounters:  03/18/20 155 lb 3.2 oz (70.4 kg)  02/25/20 147 lb (66.7 kg)  11/22/19 150 lb (68 kg)     GEN:  Well nourished, well developed in no acute distress HEENT: Normal NECK: No JVD; No carotid bruits LYMPHATICS: No  lymphadenopathy CARDIAC: RRR, no murmurs, rubs, gallops RESPIRATORY:  Clear to auscultation without rales, wheezing or rhonchi  ABDOMEN: Soft, non-tender, non-distended MUSCULOSKELETAL:  No edema; No deformity  SKIN: Warm and dry NEUROLOGIC:  Alert and oriented x 3 PSYCHIATRIC:  Normal affect    Signed, Norman Herrlich, MD  03/18/2020 9:19 AM    [facility]

## 2020-03-18 ENCOUNTER — Encounter: Payer: Self-pay | Admitting: Cardiology

## 2020-03-18 ENCOUNTER — Ambulatory Visit (INDEPENDENT_AMBULATORY_CARE_PROVIDER_SITE_OTHER): Payer: Self-pay | Admitting: Cardiology

## 2020-03-18 ENCOUNTER — Other Ambulatory Visit: Payer: Self-pay | Admitting: Cardiology

## 2020-03-18 ENCOUNTER — Other Ambulatory Visit: Payer: Self-pay

## 2020-03-18 VITALS — BP 128/60 | HR 80 | Ht 70.0 in | Wt 155.2 lb

## 2020-03-18 DIAGNOSIS — E782 Mixed hyperlipidemia: Secondary | ICD-10-CM

## 2020-03-18 DIAGNOSIS — I779 Disorder of arteries and arterioles, unspecified: Secondary | ICD-10-CM

## 2020-03-18 DIAGNOSIS — I25118 Atherosclerotic heart disease of native coronary artery with other forms of angina pectoris: Secondary | ICD-10-CM

## 2020-03-18 DIAGNOSIS — I493 Ventricular premature depolarization: Secondary | ICD-10-CM

## 2020-03-18 DIAGNOSIS — F17200 Nicotine dependence, unspecified, uncomplicated: Secondary | ICD-10-CM

## 2020-03-18 DIAGNOSIS — I10 Essential (primary) hypertension: Secondary | ICD-10-CM

## 2020-03-18 DIAGNOSIS — E119 Type 2 diabetes mellitus without complications: Secondary | ICD-10-CM

## 2020-03-18 LAB — COMPREHENSIVE METABOLIC PANEL
ALT: 18 IU/L (ref 0–44)
AST: 21 IU/L (ref 0–40)
Albumin/Globulin Ratio: 1.7 (ref 1.2–2.2)
Albumin: 4.2 g/dL (ref 3.8–4.9)
Alkaline Phosphatase: 88 IU/L (ref 44–121)
BUN/Creatinine Ratio: 18 (ref 9–20)
BUN: 28 mg/dL — ABNORMAL HIGH (ref 6–24)
Bilirubin Total: <0.2 mg/dL (ref 0.0–1.2)
CO2: 23 mmol/L (ref 20–29)
Calcium: 9.9 mg/dL (ref 8.7–10.2)
Chloride: 101 mmol/L (ref 96–106)
Creatinine, Ser: 1.58 mg/dL — ABNORMAL HIGH (ref 0.76–1.27)
GFR calc Af Amer: 55 mL/min/{1.73_m2} — ABNORMAL LOW (ref >59)
GFR calc non Af Amer: 48 mL/min/{1.73_m2} — ABNORMAL LOW (ref >59)
Globulin, Total: 2.5 g/dL (ref 1.5–4.5)
Glucose: 294 mg/dL — ABNORMAL HIGH (ref 65–99)
Potassium: 5.2 mmol/L (ref 3.5–5.2)
Sodium: 135 mmol/L (ref 134–144)
Total Protein: 6.7 g/dL (ref 6.0–8.5)

## 2020-03-18 LAB — LIPID PANEL
Chol/HDL Ratio: 2.7 ratio (ref 0.0–5.0)
Cholesterol, Total: 125 mg/dL (ref 100–199)
HDL: 46 mg/dL (ref >39)
LDL Chol Calc (NIH): 45 mg/dL (ref 0–99)
Triglycerides: 217 mg/dL — ABNORMAL HIGH (ref 0–149)
VLDL Cholesterol Cal: 34 mg/dL (ref 5–40)

## 2020-03-18 MED ORDER — NICOTINE 14 MG/24HR TD PT24: 14.0000 mg | MEDICATED_PATCH | Freq: Every day | TRANSDERMAL | 0 refills | Status: DC

## 2020-03-18 MED ORDER — NITROGLYCERIN 0.4 MG SL SUBL: 0.4000 mg | SUBLINGUAL_TABLET | SUBLINGUAL | 3 refills | Status: DC | PRN

## 2020-03-18 MED FILL — NICOTINE 14 MG/24HR PATCH: 14 | 28 days supply | Qty: 28 | Fill #0

## 2020-03-18 MED FILL — NITROGLYCERIN 0.4 MG TAB SL: 0.4 | 20 days supply | Qty: 25 | Fill #0

## 2020-03-18 NOTE — Patient Instructions (Signed)
Medication Instructions:  Your physician has recommended you make the following change in your medication: START: Nicotine patch 14 mg apply one patch to your skin daily.   *If you need a refill on your cardiac medications before your next appointment, please call your pharmacy*   Lab Work: Your physician recommends that you return for lab work in: TODAY CMP, Lipids If you have labs (blood work) drawn today and your tests are completely normal, you will receive your results only by: [name] MyChart Message (if you have MyChart) OR . A paper copy in the mail If you have any lab test that is abnormal or we need to change your treatment, we will call you to review the results.   Testing/Procedures: None   Follow-Up: At [facility], you and your health needs are our priority.  As part of our continuing mission to provide you with exceptional heart care, we have created designated Provider Care Teams.  These Care Teams include your primary Cardiologist (physician) and Advanced Practice Providers (APPs -  Physician Assistants and Nurse Practitioners) who all work together to provide you with the care you need, when you need it.  We recommend signing up for the patient portal called "MyChart".  Sign up information is provided on this After Visit Summary.  MyChart is used to connect with patients for Virtual Visits (Telemedicine).  Patients are able to view lab/test results, encounter notes, upcoming appointments, etc.  Non-urgent messages can be sent to your provider as well.   To learn more about what you can do with MyChart, go to [email or web address].    Your next appointment:   6 month(s)  The format for your next appointment:   In Person  Provider:   Norman Herrlich, MD   Other Instructions

## 2020-03-19 ENCOUNTER — Telehealth: Payer: Self-pay

## 2020-03-19 NOTE — Telephone Encounter (Signed)
-----   Message from Brian J Munley, MD sent at 03/19/2020  9:19 AM EST ----- He should stop his losartan in 1 week I recheck a BMP. 

## 2020-03-19 NOTE — Telephone Encounter (Signed)
Left message on patients voicemail to please return our call.   

## 2020-03-20 ENCOUNTER — Telehealth: Payer: Self-pay

## 2020-03-20 DIAGNOSIS — I10 Essential (primary) hypertension: Secondary | ICD-10-CM

## 2020-03-20 NOTE — Telephone Encounter (Signed)
Spoke with patient regarding results and recommendation.  Patient verbalizes understanding and is agreeable to plan of care. Advised patient to call back with any issues or concerns.  

## 2020-03-20 NOTE — Telephone Encounter (Signed)
-----   Message from Baldo Daub, MD sent at 03/19/2020  9:19 AM EST ----- He should stop his losartan in 1 week I recheck a BMP.

## 2020-03-23 LAB — FECAL OCCULT BLOOD, IMMUNOCHEMICAL: Fecal Occult Bld: NEGATIVE

## 2020-03-24 NOTE — Progress Notes (Signed)
Normal lab letter mailed.

## 2020-03-31 ENCOUNTER — Ambulatory Visit: Payer: Self-pay | Admitting: Pharmacist

## 2020-04-02 ENCOUNTER — Telehealth: Payer: Self-pay | Admitting: Internal Medicine

## 2020-04-02 NOTE — Telephone Encounter (Signed)
Please call patient and get him scheduled for in person visit to address these concerns. I don't have any openings in the next week, please add him to Amy Stephen's schedule so this can be followed up on promptly. Thanks!   Marcy Siren, D.O. Primary Care at [facility]  04/02/2020, 3:52 PM

## 2020-04-02 NOTE — Telephone Encounter (Signed)
Pt is asking for lab work for reviewing his  Kidneys and is having pain on his left leg and having numbness for about a week now.   Pt is asking if the lab work order could be sent to Dr. Norman Herrlich : Location [facility] [phone] or if it can only be done at [facility].   Thank you

## 2020-04-06 MED FILL — NITROGLYCERIN 0.4 MG TAB SL: 0.4 | 20 days supply | Qty: 25 | Fill #0

## 2020-04-07 ENCOUNTER — Encounter: Payer: Self-pay | Admitting: Family

## 2020-04-07 NOTE — Progress Notes (Signed)
Patient did not show for appointment.   

## 2020-04-10 MED FILL — NICOTINE 14 MG/24HR PATCH: 14 | 28 days supply | Qty: 28 | Fill #0

## 2020-04-11 ENCOUNTER — Other Ambulatory Visit: Payer: Self-pay

## 2020-04-11 DIAGNOSIS — E1142 Type 2 diabetes mellitus with diabetic polyneuropathy: Secondary | ICD-10-CM

## 2020-04-11 DIAGNOSIS — N529 Male erectile dysfunction, unspecified: Secondary | ICD-10-CM

## 2020-04-13 ENCOUNTER — Ambulatory Visit (INDEPENDENT_AMBULATORY_CARE_PROVIDER_SITE_OTHER): Payer: Self-pay | Admitting: Family

## 2020-04-13 ENCOUNTER — Other Ambulatory Visit: Payer: Self-pay | Admitting: Family

## 2020-04-13 ENCOUNTER — Encounter: Payer: Self-pay | Admitting: Family

## 2020-04-13 ENCOUNTER — Other Ambulatory Visit: Payer: Self-pay

## 2020-04-13 VITALS — BP 147/77 | HR 88 | Wt 156.8 lb

## 2020-04-13 DIAGNOSIS — E119 Type 2 diabetes mellitus without complications: Secondary | ICD-10-CM

## 2020-04-13 DIAGNOSIS — Z794 Long term (current) use of insulin: Secondary | ICD-10-CM

## 2020-04-13 DIAGNOSIS — E782 Mixed hyperlipidemia: Secondary | ICD-10-CM

## 2020-04-13 DIAGNOSIS — E1142 Type 2 diabetes mellitus with diabetic polyneuropathy: Secondary | ICD-10-CM

## 2020-04-13 DIAGNOSIS — K219 Gastro-esophageal reflux disease without esophagitis: Secondary | ICD-10-CM

## 2020-04-13 LAB — GLUCOSE, POCT (MANUAL RESULT ENTRY): POC Glucose: 200 mg/dl — AB (ref 70–99)

## 2020-04-13 MED ORDER — PREGABALIN 75 MG PO CAPS: 75.0000 mg | ORAL_CAPSULE | Freq: Three times a day (TID) | ORAL | 0 refills | Status: DC

## 2020-04-13 MED ORDER — METFORMIN HCL 1000 MG PO TABS: 1000.0000 mg | ORAL_TABLET | Freq: Two times a day (BID) | ORAL | 0 refills | Status: DC

## 2020-04-13 MED ORDER — PANTOPRAZOLE SODIUM 40 MG PO TBEC: 40.0000 mg | DELAYED_RELEASE_TABLET | Freq: Every day | ORAL | 0 refills | Status: DC

## 2020-04-13 MED ORDER — ATORVASTATIN CALCIUM 80 MG PO TABS: 80.0000 mg | ORAL_TABLET | Freq: Every day | ORAL | 0 refills | Status: DC

## 2020-04-13 MED ORDER — ASPIRIN EC 81 MG PO TBEC: 81.0000 mg | DELAYED_RELEASE_TABLET | Freq: Every day | ORAL | 0 refills | Status: DC

## 2020-04-13 MED ORDER — FENOFIBRATE 48 MG PO TABS: 48.0000 mg | ORAL_TABLET | Freq: Every day | ORAL | 0 refills | Status: DC

## 2020-04-13 MED ORDER — INSULIN ASPART 100 UNIT/ML FLEXPEN
12.0000 [IU] | PEN_INJECTOR | Freq: Two times a day (BID) | SUBCUTANEOUS | 2 refills | Status: DC
Start: 2020-04-13 — End: 2020-05-12

## 2020-04-13 MED FILL — PREGABALIN 75 MG CAPS: 75 | 30 days supply | Qty: 90 | Fill #0

## 2020-04-13 MED FILL — METFORMIN HCL 1000 MG TABS: 1000 | 30 days supply | Qty: 60 | Fill #0

## 2020-04-13 MED FILL — FENOFIBRATE 48 MG TABLET: 48 | 30 days supply | Qty: 30 | Fill #0

## 2020-04-13 MED FILL — ATORVASTATIN CALCIUM 80 MG: 80 | 30 days supply | Qty: 30 | Fill #0

## 2020-04-13 MED FILL — PANTOPRAZOLE SOD DR 40 MG T: 40 | 30 days supply | Qty: 30 | Fill #0

## 2020-04-13 NOTE — Progress Notes (Signed)
Patient ID: Dylan Anderson, male    DOB: May 29, 1960  MRN: [id number]  CC: Diabetes Follow-Up  Subjective: Dylan Anderson is a 60 y.o. male who presents for diabetes follow-up.  1. DIABETES TYPE 2 FOLLOW-UP:  03/10/2020: Visit with clinical pharmacist Franky Macho. Started Victoza 0.6 mg daily. Patient given instructions to increase to 1.2 mg daily in 1 week, and then to increase further to 1.8 mg daily after one week of the 1.2 mg dose. Adjusted dose of Lantus to 70 units once daily. Patient was on a TDD of 76 (taken as 38u BID). Decrease made because of Victoza start. Continued on Novolog and Metformin. Next A1c due 05/2020.  04/13/2020: Last A1C: 11.0% on  02/25/2020 Are you fasting today: [x]  Yes []  No fasting Have you taken your anti-diabetic medications today: [x]  Yes []  No  Med Adherence:  [x]  Yes    []  No Medication side effects:  []  Yes    [x]  No Home Monitoring?  [x]  Yes    []  No Home glucose results range: 150-170 fasting  Diet Adherence: [x]  Yes    []  No Exercise: [x]  Yes    []  No  Hypoglycemic episodes?: []  Yes    []  No Numbness of the feet? [x]  Yes, taking Lyrica Retinopathy hx? []  Yes    [x]  No Last eye exam: more than 1 year   Comments: said he injected in Victoza in his stomach one day when his stomach was hurting which caused his leg to hurt  2. GERD FOLLOW-UP: Acid reflux worsening. Says anything seems to cause heartburn lately. Feels like something is stuck in his throat sometimes. Heartburn frequency: increasing Medication side effects: no  Medication compliance: stable Dysphagia: no Odynophagia:  no Hematemesis: no Blood in stool: no EGD: no  Patient Active Problem List   Diagnosis Date Noted  . Type 2 diabetes mellitus (HCC)   . PVD (peripheral vascular disease) (HCC)   . Arthritis   . Back pain   . Dizzy 09/21/2018  . Mild CAD 07/08/2018  . Abnormal cardiac CT angiography   . PVC's (premature ventricular contractions) 05/28/2018  . Chest pain in adult  05/26/2018  . Claudication (HCC) 11/05/2017  . Acute gout involving toe of left foot 09/21/2017  . Chronic cough 07/13/2017  . Healthcare maintenance 07/12/2017  . Peripheral artery disease (HCC) 06/07/2017  . Hip pain 05/20/2017  . Gastroesophageal reflux disease 04/27/2017  . Insomnia 02/06/2017  . Hyperlipidemia 01/25/2017  . Erectile dysfunction 01/25/2017  . Low back pain 01/25/2017  . Diabetes mellitus type II, non insulin dependent (HCC) 07/10/2012  . Hypertension 07/10/2012  . Tobacco abuse 07/10/2012     Current Outpatient Medications on File Prior to Visit  Medication Sig Dispense Refill  . amLODipine (NORVASC) 5 MG tablet Take 1 tablet (5 mg total) by mouth daily. 90 tablet 1  . aspirin EC 81 MG tablet Take 1 tablet (81 mg total) by mouth daily. 90 tablet 3  . buPROPion (WELLBUTRIN SR) 200 MG 12 hr tablet Take 1 tablet (200 mg total) by mouth 2 (two) times daily. 180 tablet 0  . Evolocumab (REPATHA SURECLICK) 140 MG/ML SOAJ Inject 140 mg into the skin every 14 (fourteen) days. 2 pen 0  . fenofibrate (TRICOR) 48 MG tablet Take 1 tablet (48 mg total) by mouth daily. 90 tablet 2  . glucose blood (TRUE METRIX BLOOD GLUCOSE TEST) test strip Use as instructed. Check blood glucose level by fingerstick three times per day. E11.65 200 each 6  .  insulin aspart (NOVOLOG) 100 UNIT/ML FlexPen Inject 12 Units into the skin 2 (two) times daily with a meal. 9 mL 6  . insulin glargine (LANTUS) 100 UNIT/ML Solostar Pen Inject 70 Units into the skin daily. Inject 38 units in skin morning and inject 38 units in skin at bedtime. 15 mL 2  . Insulin Pen Needle (BD ULTRA-FINE PEN NEEDLES) 29G X 12.7MM MISC ICD10 E11.9 for use with insulin administration 400 each 2  . liraglutide (VICTOZA) 18 MG/3ML SOPN Start 0.6mg  SQ daily x7days, then 1.2mg  daily x7 days, then 1.8mg  daily thereafter. 6 mL 1  . metoprolol succinate (TOPROL-XL) 50 MG 24 hr tablet Take 1 tablet (50 mg total) by mouth at bedtime. 90  tablet 1  . nicotine (NICODERM CQ - DOSED IN MG/24 HOURS) 14 mg/24hr patch Place 1 patch (14 mg total) onto the skin daily. 30 patch 0  . nitroGLYCERIN (NITROSTAT) 0.4 MG SL tablet Place 1 tablet (0.4 mg total) under the tongue every 5 (five) minutes as needed for chest pain. 90 tablet 3  . pregabalin (LYRICA) 75 MG capsule Take 1 capsule (75 mg total) by mouth 3 (three) times daily. 270 capsule 0  . tamsulosin (FLOMAX) 0.4 MG CAPS capsule Take 1 capsule (0.4 mg total) by mouth daily. 90 capsule 0  . TRUEplus Lancets 28G MISC Use as instructed. Check blood glucose level by fingerstick 3 times per day. E11.65 200 each 6  . metFORMIN (GLUCOPHAGE) 1000 MG tablet Take 1 tablet (1,000 mg total) by mouth 2 (two) times daily with a meal. 180 tablet 1   No current facility-administered medications on file prior to visit.    No Known Allergies  Social History   Socioeconomic History  . Marital status: Legally Separated    Spouse name: Not on file  . Number of children: Not on file  . Years of education: Not on file  . Highest education level: Not on file  Occupational History  . Occupation: unemployed  Tobacco Use  . Smoking status: Current Every Day Smoker    Packs/day: 0.50    Years: 17.00    Pack years: 8.50    Types: Cigarettes    Start date: 04/04/1978  . Smokeless tobacco: Never Used  . Tobacco comment: Max 1 ppd  Vaping Use  . Vaping Use: Never used  Substance and Sexual Activity  . Alcohol use: Yes    Comment: 2-3 mixed drinks 3 times per week  . Drug use: No  . Sexual activity: Yes    Partners: Female  Other Topics Concern  . Not on file  Social History Narrative  . Not on file   Social Determinants of Health   Financial Resource Strain: Not on file  Food Insecurity: Not on file  Transportation Needs: Not on file  Physical Activity: Not on file  Stress: Not on file  Social Connections: Not on file  Intimate Partner Violence: Not on file    Family History   Problem Relation Age of Onset  . Heart failure Mother   . Osteoarthritis Mother   . COPD Sister   . Stroke Sister   . Heart attack Maternal Aunt   . Heart attack Maternal Uncle     Past Surgical History:  Procedure Laterality Date  . ILIAC ARTERY STENT    . LEFT HEART CATH AND CORONARY ANGIOGRAPHY N/A 06/19/2018   Procedure: LEFT HEART CATH AND CORONARY ANGIOGRAPHY;  Surgeon: Lennette Bihari, MD;  Location: [facility];  Service: Cardiovascular;  Laterality: N/A;  . stent Left    left common and left internal illiac stent    ROS: Review of Systems Negative except as stated above  PHYSICAL EXAM: BP (!) 147/77 (BP Location: Left Arm, Patient Position: Sitting)   Pulse 88   Wt 156 lb 12.8 oz (71.1 kg)   SpO2 97%   BMI 22.50 kg/m   Physical Exam HENT:     Head: Normocephalic.  Eyes:     Extraocular Movements: Extraocular movements intact.     Pupils: Pupils are equal, round, and reactive to light.  Cardiovascular:     Rate and Rhythm: Normal rate and regular rhythm.     Pulses: Normal pulses.     Heart sounds: Normal heart sounds.  Pulmonary:     Effort: Pulmonary effort is normal.     Breath sounds: Normal breath sounds.  Musculoskeletal:     Cervical back: Normal range of motion and neck supple.  Neurological:     General: No focal deficit present.     Mental Status: He is alert and oriented to person, place, and time.  Psychiatric:        Mood and Affect: Mood normal.        Behavior: Behavior normal.    Diabetic Foot Exam - Simple   Simple Foot Form Visual Inspection No deformities, no ulcerations, no other skin breakdown bilaterally: Yes Sensation Testing See comments: Yes Pulse Check Comments Decreased sensation to touch and monofilament testing bilaterally.    Results for orders placed or performed in visit on 04/13/20  POCT glucose (manual entry)  Result Value Ref Range   POC Glucose 200 (A) 70 - 99 mg/dl    ASSESSMENT AND PLAN: 1.  Type 2 diabetes mellitus without complication, with long-term current use of insulin (HCC): - Patient fasting today, CBG 200.  - Last hemoglobin A1c 11.0% on 02/25/2020. Repeat hemoglobin A1c due February 2022. - Continue Metformin, Novolog, Lantus, and Victoza as prescribed. - Lantus and Victoza prescribed on 03/10/2020 with refills available on file. - To achieve an A1C goal of less than or equal to 7.0 percent, a fasting blood sugar of 80 to 130 mg/dL and a postprandial glucose (90 to 120 minutes after a meal) less than 180 mg/dL. In the event of sugars less than 60 mg/dl or greater than 147400 mg/dl please notify the clinic ASAP. It is recommended that you undergo annual eye exams and annual foot exams. - Discussed the importance of healthy eating habits, low-carbohydrate diet, low-sugar diet, regular aerobic exercise (at least 150 minutes a week as tolerated) and medication compliance to achieve or maintain control of diabetes. - Follow-up with clinical pharmacist in 4 weeks for diabetes checkup. Write your home blood sugar results down each day and bring those results to your appointment along with your home glucose monitor. Medications may be revised at that time if needed.  - Follow-up with primary physician as scheduled.  - Referral to Ophthalmology for diabetic eye examination. - Ambulatory referral to Ophthalmology - POCT glucose (manual entry) - metFORMIN (GLUCOPHAGE) 1000 MG tablet; Take 1 tablet (1,000 mg total) by mouth 2 (two) times daily with a meal.  Dispense: 180 tablet; Refill: 0 - insulin aspart (NOVOLOG) 100 UNIT/ML FlexPen; Inject 12 Units into the skin 2 (two) times daily with a meal.  Dispense: 9 mL; Refill: 2  2. Diabetic polyneuropathy associated with type 2 diabetes mellitus (HCC): - Continue Lyrica as prescribed. - I did check the [place] drug database and found no frequent prescribers of opiates or evidence of aberrant behavior. - Follow-up with primary  physician as scheduled. - pregabalin (LYRICA) 75 MG capsule; Take 1 capsule (75 mg total) by mouth 3 (three) times daily.  Dispense: 270 capsule; Refill: 0  3. GERD without esophagitis: - Increase Pantoprazole from 20 mg daily to 40 mg daily.  - Follow-up with primary physician as needed. - You may feel better if you: ?Lose weight (if you are overweight) ?Raise the head of your bed by 6 to 8 inches - You can do this by putting blocks of wood or rubber under 2 legs of the bed or a foam wedge under the mattress. ?Avoid foods that make your symptoms worse - For some people these include coffee, chocolate, alcohol, peppermint, and fatty foods. ?Stop smoking, if you smoke ?Avoid late meals - Lying down with a full stomach can make reflux worse. Try to plan meals for at least 2 to 3 hours before bedtime. ?Avoid tight clothing - Some people feel better if they wear comfortable clothing that does not squeeze the stomach area. - pantoprazole (PROTONIX) 40 MG tablet; Take 1 tablet (40 mg total) by mouth daily.  Dispense: 90 tablet; Refill: 0  4. Mixed hyperlipidemia: - Continue Atorvastatin, Fenofibrate, and Aspirin as prescribed.  - Practice low-fat heart healthy diet and at least 150 minutes of moderate intensity exercise weekly as tolerated.  - Last lipid panel obtained 03/18/2020. - Follow-up with primary physician as scheduled.  - atorvastatin (LIPITOR) 80 MG tablet; Take 1 tablet (80 mg total) by mouth daily at 6 PM.  Dispense: 90 tablet; Refill: 0 - fenofibrate (TRICOR) 48 MG tablet; Take 1 tablet (48 mg total) by mouth daily.  Dispense: 90 tablet; Refill: 0 - aspirin EC 81 MG tablet; Take 1 tablet (81 mg total) by mouth daily.  Dispense: 120 tablet; Refill: 0  Patient was given the opportunity to ask questions.  Patient verbalized understanding of the plan and was able to repeat key elements of the plan. Patient was given clear instructions to go to Emergency Department or return to medical  center if symptoms don't improve, worsen, or new problems develop.The patient verbalized understanding.   Orders Placed This Encounter  Procedures  . Ambulatory referral to Ophthalmology  . POCT glucose (manual entry)    Requested Prescriptions   Signed Prescriptions Disp Refills  . pantoprazole (PROTONIX) 40 MG tablet 90 tablet 0    Sig: Take 1 tablet (40 mg total) by mouth daily.  [name] atorvastatin (LIPITOR) 80 MG tablet 90 tablet 0    Sig: Take 1 tablet (80 mg total) by mouth daily at 6 PM.    Return in about 4 weeks (around 05/11/2020) for clinical pharmacist [facility].  Rema Fendt, NP

## 2020-04-13 NOTE — Progress Notes (Signed)
Needs med refill-Metformin, pantoprazole, Lyrica,   Pt injected Victoza in outer left leg/thigh about 2 weeks ago and experienced numbness/pain

## 2020-04-13 NOTE — Patient Instructions (Signed)
Continue Metformin, Novolog, Lantus, Victoza, and Lyrica for diabetes.  Follow-up with clinical pharmacist in 4 weeks for diabetes checkup. Write your home blood sugar results down each day and bring those results to your appointment along with your home glucose monitor.   Continue Protonix for acid reflux.  Referral to Ophthalmology for diabetic eye exam.  Diabetes Mellitus Basics  Diabetes mellitus, or diabetes, is a long-term (chronic) disease. It occurs when the body does not properly use sugar (glucose) that is released from food after you eat. Diabetes mellitus may be caused by one or both of these problems:  Your pancreas does not make enough of a hormone called insulin.  Your body does not react in a normal way to the insulin that it makes. Insulin lets glucose enter cells in your body. This gives you energy. If you have diabetes, glucose cannot get into cells. This causes high blood glucose (hyperglycemia). How to treat and manage diabetes You may need to take insulin or other diabetes medicines daily to keep your glucose in balance. If you are prescribed insulin, you will learn how to give yourself insulin by injection. You may need to adjust the amount of insulin you take based on the foods that you eat. You will need to check your blood glucose levels using a glucose monitor as told by your health care provider. The readings can help determine if you have low or high blood glucose. Generally, you should have these blood glucose levels:  Before meals (preprandial): 80-130 mg/dL (4.0-0.8 mmol/L).  After meals (postprandial): below 180 mg/dL (10 mmol/L).  Hemoglobin A1c (HbA1c) level: less than 7%. Your health care provider will set treatment goals for you. Keep all follow-up visits. This is important. Follow these instructions at home: Diabetes medicines Take your diabetes medicines every day as told by your health care provider. List your diabetes medicines here:  Name  of medicine: ______________________________ ? Amount (dose): _______________ Time (a.m./p.m.): _______________ Notes: ___________________________________  Name of medicine: ______________________________ ? Amount (dose): _______________ Time (a.m./p.m.): _______________ Notes: ___________________________________  Name of medicine: ______________________________ ? Amount (dose): _______________ Time (a.m./p.m.): _______________ Notes: ___________________________________ Insulin If you use insulin, list the types of insulin you use here:  Insulin type: ______________________________ ? Amount (dose): _______________ Time (a.m./p.m.): _______________Notes: ___________________________________  Insulin type: ______________________________ ? Amount (dose): _______________ Time (a.m./p.m.): _______________ Notes: ___________________________________  Insulin type: ______________________________ ? Amount (dose): _______________ Time (a.m./p.m.): _______________ Notes: ___________________________________  Insulin type: ______________________________ ? Amount (dose): _______________ Time (a.m./p.m.): _______________ Notes: ___________________________________  Insulin type: ______________________________ ? Amount (dose): _______________ Time (a.m./p.m.): _______________ Notes: ___________________________________ Managing blood glucose Check your blood glucose levels using a glucose monitor as told by your health care provider. Write down the times that you check your glucose levels here:  Time: _______________ Notes: ___________________________________  Time: _______________ Notes: ___________________________________  Time: _______________ Notes: ___________________________________  Time: _______________ Notes: ___________________________________  Time: _______________ Notes: ___________________________________  Time: _______________ Notes: ___________________________________   Low blood  glucose Low blood glucose (hypoglycemia) is when glucose is at or below 70 mg/dL (3.9 mmol/L). Symptoms may include:  Feeling: ? Hungry. ? Sweaty and clammy. ? Irritable or easily upset. ? Dizzy. ? Sleepy.  Having: ? A fast heartbeat. ? A headache. ? A change in your vision. ? Numbness around the mouth, lips, or tongue.  Having trouble with: ? Moving (coordination). ? Sleeping. Treating low blood glucose To treat low blood glucose, eat or drink something containing sugar right away. If you can think clearly and swallow safely,  follow the 15:15 rule:  Take 15 grams of a fast-acting carb (carbohydrate), as told by your health care provider.  Some fast-acting carbs are: ? Glucose tablets: take 3-4 tablets. ? Hard candy: eat 3-5 pieces. ? Fruit juice: drink 4 oz (120 mL). ? Regular (not diet) soda: drink 4-6 oz (120-180 mL). ? Honey or sugar: eat 1 Tbsp (15 mL).  Check your blood glucose levels 15 minutes after you take the carb.  If your glucose is still at or below 70 mg/dL (3.9 mmol/L), take 15 grams of a carb again.  If your glucose does not go above 70 mg/dL (3.9 mmol/L) after 3 tries, get help right away.  After your glucose goes back to normal, eat a meal or a snack within 1 hour. Treating very low blood glucose If your glucose is at or below 54 mg/dL (3 mmol/L), you have very low blood glucose (severe hypoglycemia). This is an emergency. Do not wait to see if the symptoms will go away. Get medical help right away. Call your local emergency services (911 in the [place] Do not drive yourself to the hospital. Questions to ask your health care provider  Should I talk with a diabetes educator?  What equipment will I need to care for myself at home?  What diabetes medicines do I need? When should I take them?  How often do I need to check my blood glucose levels?  What number can I call if I have questions?  When is my follow-up visit?  Where can I find a support  group for people with diabetes? Where to find more information  American Diabetes Association: www.diabetes.org  Association of Diabetes Care and Education Specialists: www.diabeteseducator.org Contact a health care provider if:  Your blood glucose is at or above 240 mg/dL (09.3 mmol/L) for 2 days in a row.  You have been sick or have had a fever for 2 days or more, and you are not getting better.  You have any of these problems for more than 6 hours: ? You cannot eat or drink. ? You feel nauseous. ? You vomit. ? You have diarrhea. Get help right away if:  Your blood glucose is lower than 54 mg/dL (3 mmol/L).  You get confused.  You have trouble thinking clearly.  You have trouble breathing. These symptoms may represent a serious problem that is an emergency. Do not wait to see if the symptoms will go away. Get medical help right away. Call your local emergency services (911 in the [place] Do not drive yourself to the hospital. Summary  Diabetes mellitus is a chronic disease that occurs when the body does not properly use sugar (glucose) that is released from food after you eat.  Take insulin and diabetes medicines as told.  Check your blood glucose every day, as often as told.  Keep all follow-up visits. This is important. This information is not intended to replace advice given to you by your health care provider. Make sure you discuss any questions you have with your health care provider. Document Revised: 07/23/2019 Document Reviewed: 07/23/2019 Elsevier Patient Education  2021 [employer].

## 2020-05-04 ENCOUNTER — Encounter: Payer: Self-pay | Admitting: Internal Medicine

## 2020-05-04 ENCOUNTER — Other Ambulatory Visit: Payer: Self-pay | Admitting: Internal Medicine

## 2020-05-04 ENCOUNTER — Ambulatory Visit (INDEPENDENT_AMBULATORY_CARE_PROVIDER_SITE_OTHER): Payer: Self-pay | Admitting: Internal Medicine

## 2020-05-04 ENCOUNTER — Other Ambulatory Visit: Payer: Self-pay

## 2020-05-04 VITALS — BP 128/74 | HR 80 | Temp 98.0°F | Resp 18 | Ht 69.0 in | Wt 152.0 lb

## 2020-05-04 DIAGNOSIS — I739 Peripheral vascular disease, unspecified: Secondary | ICD-10-CM

## 2020-05-04 DIAGNOSIS — E119 Type 2 diabetes mellitus without complications: Secondary | ICD-10-CM

## 2020-05-04 DIAGNOSIS — E1142 Type 2 diabetes mellitus with diabetic polyneuropathy: Secondary | ICD-10-CM

## 2020-05-04 DIAGNOSIS — Z794 Long term (current) use of insulin: Secondary | ICD-10-CM

## 2020-05-04 LAB — GLUCOSE, POCT (MANUAL RESULT ENTRY): POC Glucose: 73 mg/dl (ref 70–99)

## 2020-05-04 MED ORDER — PREGABALIN 100 MG PO CAPS: 100.0000 mg | ORAL_CAPSULE | Freq: Three times a day (TID) | ORAL | 2 refills | Status: DC

## 2020-05-04 MED FILL — PREGABALIN 100 MG CAPS: 100 | 30 days supply | Qty: 90 | Fill #0

## 2020-05-04 NOTE — Patient Instructions (Signed)
[facility] [address] [place],    [address]  Main: [phone] 

## 2020-05-04 NOTE — Progress Notes (Unsigned)
Subjective:    Jarrick Fjeld - 60 y.o. male MRN [id number]  Date of birth: January 24, 1961  HPI  Duard Spiewak is here for concern about the bottom of his left foot feeling like the bones are being crushed. He noticed a red/purple spot on the bottom of his left little toe. The heel also looks purple or red. When his foot hits the floor, shoots pain up his leg. Feels like a grating against his foot. Has been on Lyrica for a few months. Was previously on Gabapentin without relief. Feet always feel cold and like he has little needles in them.      Health Maintenance:  Health Maintenance Due  Topic Date Due  . COVID-19 Vaccine (2 - Booster for Janssen series) 08/16/2019  . URINE MICROALBUMIN  04/28/2020    -  reports that he has been smoking cigarettes. He started smoking about 42 years ago. He has a 8.50 pack-year smoking history. He has never used smokeless tobacco. - Review of Systems: Per HPI. - Past Medical History: Patient Active Problem List   Diagnosis Date Noted  . Type 2 diabetes mellitus (HCC)   . PVD (peripheral vascular disease) (HCC)   . Arthritis   . Back pain   . Dizzy 09/21/2018  . Mild CAD 07/08/2018  . Abnormal cardiac CT angiography   . PVC's (premature ventricular contractions) 05/28/2018  . Chest pain in adult 05/26/2018  . Claudication (HCC) 11/05/2017  . Acute gout involving toe of left foot 09/21/2017  . Chronic cough 07/13/2017  . Healthcare maintenance 07/12/2017  . Peripheral artery disease (HCC) 06/07/2017  . Hip pain 05/20/2017  . Gastroesophageal reflux disease 04/27/2017  . Insomnia 02/06/2017  . Hyperlipidemia 01/25/2017  . Erectile dysfunction 01/25/2017  . Low back pain 01/25/2017  . Diabetes mellitus type II, non insulin dependent (HCC) 07/10/2012  . Hypertension 07/10/2012  . Tobacco abuse 07/10/2012   - Medications: reviewed and updated   Objective:   Physical Exam BP 128/74 (BP Location: Right Arm, Patient Position: Sitting, Cuff Size:  Normal)   Pulse 80   Temp 98 F (36.7 C) (Oral)   Resp 18   Ht 5\' 9"  (1.753 m)   Wt 152 lb (68.9 kg)   SpO2 97%   BMI 22.45 kg/m  Physical Exam Constitutional:      General: He is not in acute distress.    Appearance: He is not diaphoretic.  Cardiovascular:     Rate and Rhythm: Normal rate.  Pulmonary:     Effort: Pulmonary effort is normal. No respiratory distress.  Musculoskeletal:        General: Normal range of motion.     Comments: Left foot with small area of discoloration at heel and pinky toe without ulceration or erythema. Left lower extremity is cool with difficult to palpate pedal pulses.   Skin:    General: Skin is warm and dry.  Neurological:     Mental Status: He is alert and oriented to person, place, and time.  Psychiatric:        Mood and Affect: Affect normal.        Judgment: Judgment normal.            Assessment & Plan:   1. Type 2 diabetes mellitus without complication, with long-term current use of insulin (HCC) Uncontrolled with result of A1c 11% in Nov.  - Glucose (CBG)  2. Diabetic polyneuropathy associated with type 2 diabetes mellitus [facility]) Discussed with patient that I suspect a lot  of his symptoms are related to his uncontrolled DM and will not improve without improved glycemic control. Will increase Lyrica dosing. Discussed that do not have much more room for dose change due to renal impairment.  - pregabalin (LYRICA) 100 MG capsule; Take 1 capsule (100 mg total) by mouth 3 (three) times daily.  Dispense: 90 capsule; Refill: 2  3. Peripheral artery disease [facility]) Patient with history of common illiac stent placement for history of PAD. Was previously referred by prior PCP for concern for worsening disease. Had normal ABIs in Nov 2020. Was supposed to return for repeat ABIs in one year and was lost to follow up. Re-establish care especially in light of worsening symptoms and concern for poor peripheral pulses on exam today.  - Ambulatory  referral to Vascular Surgery  Marcy Siren, D.O. 05/04/2020, 2:40 PM Primary Care at [facility]

## 2020-05-04 NOTE — Progress Notes (Unsigned)
Reports chronic L leg/foot pain radiating to L hip/shin, reports trouble moving/bending toes, worsening x 2 wks

## 2020-05-08 ENCOUNTER — Other Ambulatory Visit: Payer: Self-pay | Admitting: Nurse Practitioner

## 2020-05-08 DIAGNOSIS — Z794 Long term (current) use of insulin: Secondary | ICD-10-CM

## 2020-05-08 DIAGNOSIS — E119 Type 2 diabetes mellitus without complications: Secondary | ICD-10-CM

## 2020-05-08 MED FILL — !VICTOZA 18MG/3ML INJECT: 18 | 20 days supply | Qty: 6 | Fill #1

## 2020-05-08 MED FILL — ?BASAGLAR 100 UNITS/ML KWPE: 100 | 30 days supply | Qty: 21 | Fill #1

## 2020-05-11 ENCOUNTER — Ambulatory Visit: Payer: Self-pay | Attending: Family Medicine | Admitting: Pharmacist

## 2020-05-11 ENCOUNTER — Encounter: Payer: Self-pay | Admitting: Pharmacist

## 2020-05-11 ENCOUNTER — Other Ambulatory Visit: Payer: Self-pay | Admitting: Nurse Practitioner

## 2020-05-11 ENCOUNTER — Other Ambulatory Visit: Payer: Self-pay

## 2020-05-11 DIAGNOSIS — IMO0002 Reserved for concepts with insufficient information to code with codable children: Secondary | ICD-10-CM

## 2020-05-11 DIAGNOSIS — E1165 Type 2 diabetes mellitus with hyperglycemia: Secondary | ICD-10-CM

## 2020-05-11 DIAGNOSIS — E118 Type 2 diabetes mellitus with unspecified complications: Secondary | ICD-10-CM

## 2020-05-11 LAB — POCT GLYCOSYLATED HEMOGLOBIN (HGB A1C): Hemoglobin A1C: 9.7 % — AB (ref 4.0–5.6)

## 2020-05-11 NOTE — Progress Notes (Signed)
    S:    PCP: Dr. Earlene Plater   Patient arrives in good spirits. Presents for diabetes evaluation, education, and management. Patient was referred and last seen by Primary Care Provider on 02/25/2020. I last saw him on 03/10/2020 and started Victoza.   Since then, pt reports doing well. He is pleased with the Victoza. Denies NV, abdominal pain. He reports compliance with his other medications. No other concerns today.   Family/Social History:  - FHx: HF, stroke, MI  - Current 0.5 PPD smoker - Alcohol: denies use  Insurance coverage/medication affordability: self pay  Medication adherence reported.   Current diabetes medications include: metformin 1000 mg BID, Lantus 70 units daily, Novolog 12 units BID, Victoza 1.8 mg daily Current hypertension medications include: amlodipine 5 mg daily, losartan 100 mg daily, Toprol XL 50 mg daily  Current hyperlipidemia medications include: atorvastatin 80 mg daily, Repatha 140 mg q14days, fenofibrate 48 mg daily   Patient reports occasional hypoglycemic events. Often occurs midday.   Patient reported dietary habits: Eats 2-3 meals/day - Mostly fruits and salads - Will occasionally consume rice and red meat - Proteins: mostly baked chicken or fish   Patient-reported exercise habits:  - Works daily in the yard    Patient reports nocturia (nighttime urination).  Patient denies neuropathy (nerve pain). Patient reports visual changes. Patient reports self foot exams.     O:  Physical Exam    ROS   Lab Results  Component Value Date   HGBA1C 9.7 (A) 05/11/2020   There were no vitals filed for this visit.  Lipid Panel     Component Value Date/Time   CHOL 125 03/18/2020 0920   TRIG 217 (H) 03/18/2020 0920   HDL 46 03/18/2020 0920   CHOLHDL 2.7 03/18/2020 0920   CHOLHDL 5.7 07/10/2012 1301   VLDL 36 07/10/2012 1301   LDLCALC 45 03/18/2020 0920    Home fasting blood sugars: endorses some 200-220s in the mornings. CBGs after eating  will come down to 130s-170s.  Clinical Atherosclerotic Cardiovascular Disease (ASCVD): Yes - CAD The ASCVD Risk score Denman George DC Jr., et al., 2013) failed to calculate for the following reasons:   The valid total cholesterol range is 130 to 320 mg/dL    A/P: Diabetes longstanding currently uncontrolled, however, A1c is trending down (11.0 to 9.7 today). Patient is able to verbalize appropriate hypoglycemia management plan. Medication adherence appears appropriate.  -Increase Lantus to 80u daily. -Continue Victoza, Novolog, and metformin at current doses.  -Extensively discussed pathophysiology of diabetes, recommended lifestyle interventions, dietary effects on blood sugar control -Counseled on s/sx of and management of hypoglycemia -Next A1C anticipated 08/2020.   Written patient instructions provided. Total time in face to face counseling 30 minutes.   Follow up Pharmacist Clinic Visit in 1.5 months.    Butch Penny, PharmD, Patsy [place], CPP Clinical Pharmacist [facility] [phone]

## 2020-05-12 ENCOUNTER — Other Ambulatory Visit: Payer: Self-pay | Admitting: Internal Medicine

## 2020-05-12 MED FILL — $novoLOG FLEXPEN SYRINGE: 100 | 30 days supply | Qty: 6 | Fill #0

## 2020-05-14 ENCOUNTER — Ambulatory Visit: Payer: Self-pay | Admitting: Internal Medicine

## 2020-05-28 ENCOUNTER — Ambulatory Visit: Payer: Self-pay | Admitting: Internal Medicine

## 2020-06-02 ENCOUNTER — Other Ambulatory Visit: Payer: Self-pay

## 2020-06-02 DIAGNOSIS — I739 Peripheral vascular disease, unspecified: Secondary | ICD-10-CM

## 2020-06-11 ENCOUNTER — Ambulatory Visit (INDEPENDENT_AMBULATORY_CARE_PROVIDER_SITE_OTHER): Payer: Self-pay | Admitting: Physician Assistant

## 2020-06-11 ENCOUNTER — Other Ambulatory Visit: Payer: Self-pay

## 2020-06-11 ENCOUNTER — Ambulatory Visit (HOSPITAL_COMMUNITY)
Admission: RE | Admit: 2020-06-11 | Discharge: 2020-06-11 | Disposition: A | Discharge status: Home / Self Care | Payer: Self-pay | Source: Ambulatory Visit | Attending: Vascular Surgery | Admitting: Vascular Surgery

## 2020-06-11 VITALS — BP 133/80 | HR 86 | Temp 98.0°F | Resp 20 | Ht 69.0 in | Wt 151.3 lb

## 2020-06-11 DIAGNOSIS — I739 Peripheral vascular disease, unspecified: Secondary | ICD-10-CM | POA: Insufficient documentation

## 2020-06-11 DIAGNOSIS — I779 Disorder of arteries and arterioles, unspecified: Secondary | ICD-10-CM

## 2020-06-11 DIAGNOSIS — M79605 Pain in left leg: Secondary | ICD-10-CM

## 2020-06-11 NOTE — Progress Notes (Signed)
Office Note     CC:  follow up Requesting Provider:  Leary Roca*  HPI: Dylan Anderson is a 60 y.o. (11-29-1960) male who presents follow up peripheral artery occlusive disease. He previously had a left common iliac stent placed at [facility] several years ago.  He was last seen on February 25, 2019 with plans to follow-up in 1 year.  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  He is complaining of bilateral lower extremity claudication of approximately 40 to 50 yards.  Does have some pain at nighttime but this does not sound like classic rest pain.  He states he has noticed skin cracks along the left lateral plantar foot surface.  He is diabetic and states his previous A1c was 11 now down to 9.  He states his glucose averages approximately 170. He is working at quitting smoking and is using nicotine patches.  He dates he is down to less than 4 cigarettes on most days and some days does not smoke at all. He is compliant with daily aspirin and statin.   Past Medical History:  Diagnosis Date  . Acute gout involving toe of left foot 09/21/2017  . Arthritis   . Back pain   . Chest pain in adult 05/26/2018  . Chronic cough 07/13/2017  . Claudication (HCC)   . Diabetes mellitus type II, non insulin dependent (HCC) 07/10/2012  . Erectile dysfunction   . Gastroesophageal reflux disease 04/27/2017  . Hand pain 03/18/2017  . Healthcare maintenance 07/12/2017  . Hip pain 05/20/2017  . Hyperlipidemia 01/25/2017  . Hypertension 07/10/2012  . Insomnia 02/06/2017  . Low back pain 01/25/2017  . Peripheral artery disease (HCC) 06/07/2017  . PVD (peripheral vascular disease) (HCC)   . Tobacco abuse 07/10/2012  . Type 2 diabetes mellitus (HCC)     Past Surgical History:  Procedure Laterality Date  . ILIAC ARTERY STENT    . LEFT HEART CATH AND CORONARY ANGIOGRAPHY N/A 06/19/2018   Procedure: LEFT HEART CATH AND CORONARY ANGIOGRAPHY;  Surgeon: Lennette Bihari, MD;   Location: [facility] INVASIVE CV LAB;  Service: Cardiovascular;  Laterality: N/A;  . stent Left    left common and left internal illiac stent    Social History   Socioeconomic History  . Marital status: Legally Separated    Spouse name: Not on file  . Number of children: Not on file  . Years of education: Not on file  . Highest education level: Not on file  Occupational History  . Occupation: unemployed  Tobacco Use  . Smoking status: Current Every Day Smoker    Packs/day: 0.50    Years: 17.00    Pack years: 8.50    Types: Cigarettes    Start date: 04/04/1978  . Smokeless tobacco: Never Used  . Tobacco comment: Max 1 ppd  Vaping Use  . Vaping Use: Never used  Substance and Sexual Activity  . Alcohol use: Yes    Comment: 2-3 mixed drinks 3 times per week  . Drug use: No  . Sexual activity: Yes    Partners: Female  Other Topics Concern  . Not on file  Social History Narrative  . Not on file   Social Determinants of Health   Financial Resource Strain: Not on file  Food Insecurity: Not on file  Transportation Needs: Not on file  Physical Activity: Not on file  Stress: Not on file  Social Connections: Not on file  Intimate Partner Violence: Not on file   Family History  Problem Relation Age of Onset  . Heart failure Mother   . Osteoarthritis Mother   . COPD Sister   . Stroke Sister   . Heart attack Maternal Aunt   . Heart attack Maternal Uncle     Current Outpatient Medications  Medication Sig Dispense Refill  . amLODipine (NORVASC) 5 MG tablet Take 1 tablet (5 mg total) by mouth daily. 90 tablet 1  . aspirin EC 81 MG tablet Take 1 tablet (81 mg total) by mouth daily. 120 tablet 0  . atorvastatin (LIPITOR) 80 MG tablet Take 1 tablet (80 mg total) by mouth daily at 6 PM. 90 tablet 0  . buPROPion (WELLBUTRIN SR) 200 MG 12 hr tablet Take 1 tablet (200 mg total) by mouth 2 (two) times daily. 180 tablet 0  . Evolocumab (REPATHA SURECLICK) 140 MG/ML SOAJ Inject 140 mg into  the skin every 14 (fourteen) days. 2 pen 0  . fenofibrate (TRICOR) 48 MG tablet Take 1 tablet (48 mg total) by mouth daily. 90 tablet 0  . glucose blood (TRUE METRIX BLOOD GLUCOSE TEST) test strip Use as instructed. Check blood glucose level by fingerstick three times per day. E11.65 200 each 6  . insulin glargine (LANTUS) 100 UNIT/ML Solostar Pen Inject 70 Units into the skin daily. Inject 38 units in skin morning and inject 38 units in skin at bedtime. 15 mL 2  . liraglutide (VICTOZA) 18 MG/3ML SOPN Start 0.6mg  SQ daily x7days, then 1.2mg  daily x7 days, then 1.8mg  daily thereafter. 6 mL 1  . metFORMIN (GLUCOPHAGE) 1000 MG tablet Take 1 tablet (1,000 mg total) by mouth 2 (two) times daily with a meal. 180 tablet 0  . metoprolol succinate (TOPROL-XL) 50 MG 24 hr tablet Take 1 tablet (50 mg total) by mouth at bedtime. 90 tablet 1  . nicotine (NICODERM CQ - DOSED IN MG/24 HOURS) 14 mg/24hr patch Place 1 patch (14 mg total) onto the skin daily. 30 patch 0  . nitroGLYCERIN (NITROSTAT) 0.4 MG SL tablet Place 1 tablet (0.4 mg total) under the tongue every 5 (five) minutes as needed for chest pain. 90 tablet 3  . NOVOLOG FLEXPEN 100 UNIT/ML FlexPen INJECT 10 UNITS INTO THE SKIN 2 (TWO) TIMES DAILY WITH A MEAL. 6 mL 6  . pantoprazole (PROTONIX) 40 MG tablet Take 1 tablet (40 mg total) by mouth daily. 90 tablet 0  . pregabalin (LYRICA) 100 MG capsule Take 1 capsule (100 mg total) by mouth 3 (three) times daily. 90 capsule 2  . tamsulosin (FLOMAX) 0.4 MG CAPS capsule Take 1 capsule (0.4 mg total) by mouth daily. 90 capsule 0  . TRUEplus Lancets 28G MISC Use as instructed. Check blood glucose level by fingerstick 3 times per day. E11.65 200 each 6  . TRUEPLUS PEN NEEDLES 31G X 8 MM MISC USE AS DIRECTED WITH INSULIN 100 each 2   No current facility-administered medications for this visit.    No Known Allergies   REVIEW OF SYSTEMS:   [X]  denotes positive finding, [ ]  denotes negative finding Cardiac   Comments:  Chest pain or chest pressure:    Shortness of breath upon exertion:    Short of breath when lying flat:    Irregular heart rhythm:        Vascular    Pain in calf, thigh, or hip brought on by ambulation:    Pain in feet at night that wakes you up from your  sleep:     Blood clot in your veins:    Leg swelling:         Pulmonary    Oxygen at home:    Productive cough:     Wheezing:         Neurologic    Sudden weakness in arms or legs:     Sudden numbness in arms or legs:     Sudden onset of difficulty speaking or slurred speech:    Temporary loss of vision in one eye:     Problems with dizziness:         Gastrointestinal    Blood in stool:     Vomited blood:         Genitourinary    Burning when urinating:     Blood in urine:        Psychiatric    Major depression:         Hematologic    Bleeding problems:    Problems with blood clotting too easily:        Skin    Rashes or ulcers:        Constitutional    Fever or chills:      PHYSICAL EXAMINATION:  Vitals:   06/11/20 1452  BP: 133/80  Pulse: 86  Resp: 20  Temp: 98 F (36.7 C)  TempSrc: Temporal  SpO2: 100%  Weight: 151 lb 4.8 oz (68.6 kg)  Height: 5\' 9"  (1.753 m)    General:  WDWN in NAD; vital signs documented above Gait: Unaided, no ataxia HENT: WNL, normocephalic Pulmonary: normal non-labored breathing Cardiac: regular HR Abdomen: soft, NT, no masses Skin: without rashes Vascular Exam/Pulses: 2+ right dorsalis pedis pulse.  I cannot palpate his left pedal pulses or femoral pulse. Extremities: with ischemic changes> purple discoloration to the plantar surface of his left fifth toe, without Gangrene , without cellulitis; without open wounds; several small fissures along the plantar aspect of his left lateral foot Musculoskeletal: no muscle wasting or atrophy  Neurologic: A&O X 3;  No focal weakness or paresthesias are detected Psychiatric:  The pt has Normal  affect.        Non-Invasive Vascular Imaging:   06/11/2020 ABI/TBIToday's ABIToday's TBIPrevious ABIPrevious TBI  +-------+-----------+-----------+------------+------------+  Right 1.15    0.99    1.08    0.65      +-------+-----------+-----------+------------+------------+  Left  0.51    0.2    0.95    0.24      +-------+-----------+-----------+------------+------------+    ASSESSMENT/PLAN:: 60 y.o. male here for follow up for peripheral arterial disease.  He has had a drop in his left ABI from 0.95-0.51.  He has bilateral lower extremity pain with exercise.  He has history of left common iliac artery stent placed [facility] several years ago.  He did not get arterial duplex examination today.  I discussed ABIs and physical exam findings with Dr. [facility].  We will make arrangements for aortogram with left lower extremity runoff with or without intervention with next available surgeon.  The patient is in agreement with this plan.  Darrick Penna, PA-C [facility] [phone]  Clinic MD:   [phone]

## 2020-06-15 ENCOUNTER — Other Ambulatory Visit: Payer: Self-pay

## 2020-06-17 ENCOUNTER — Other Ambulatory Visit (HOSPITAL_COMMUNITY)
Admission: RE | Admit: 2020-06-17 | Discharge: 2020-06-17 | Disposition: A | Discharge status: Home / Self Care | Payer: Self-pay | Source: Ambulatory Visit | Attending: Vascular Surgery | Admitting: Vascular Surgery

## 2020-06-17 DIAGNOSIS — Z20822 Contact with and (suspected) exposure to covid-19: Secondary | ICD-10-CM | POA: Insufficient documentation

## 2020-06-17 DIAGNOSIS — Z01812 Encounter for preprocedural laboratory examination: Secondary | ICD-10-CM | POA: Insufficient documentation

## 2020-06-17 LAB — SARS CORONAVIRUS 2 (TAT 6-24 HRS): SARS Coronavirus 2: NEGATIVE

## 2020-06-18 ENCOUNTER — Ambulatory Visit (HOSPITAL_COMMUNITY)
Admission: RE | Admit: 2020-06-18 | Discharge: 2020-06-18 | Disposition: A | Discharge status: Home / Self Care | Payer: Self-pay | Source: Ambulatory Visit | Attending: Vascular Surgery | Admitting: Vascular Surgery

## 2020-06-18 ENCOUNTER — Encounter (HOSPITAL_COMMUNITY)
Admission: RE | Disposition: A | Discharge status: Home / Self Care | Payer: Self-pay | Source: Ambulatory Visit | Attending: Vascular Surgery

## 2020-06-18 ENCOUNTER — Other Ambulatory Visit: Payer: Self-pay

## 2020-06-18 DIAGNOSIS — I70212 Atherosclerosis of native arteries of extremities with intermittent claudication, left leg: Secondary | ICD-10-CM | POA: Insufficient documentation

## 2020-06-18 DIAGNOSIS — Z794 Long term (current) use of insulin: Secondary | ICD-10-CM | POA: Insufficient documentation

## 2020-06-18 DIAGNOSIS — Z79899 Other long term (current) drug therapy: Secondary | ICD-10-CM | POA: Insufficient documentation

## 2020-06-18 DIAGNOSIS — E1151 Type 2 diabetes mellitus with diabetic peripheral angiopathy without gangrene: Secondary | ICD-10-CM | POA: Insufficient documentation

## 2020-06-18 DIAGNOSIS — Z7982 Long term (current) use of aspirin: Secondary | ICD-10-CM | POA: Insufficient documentation

## 2020-06-18 DIAGNOSIS — F17210 Nicotine dependence, cigarettes, uncomplicated: Secondary | ICD-10-CM | POA: Insufficient documentation

## 2020-06-18 DIAGNOSIS — Z7984 Long term (current) use of oral hypoglycemic drugs: Secondary | ICD-10-CM | POA: Insufficient documentation

## 2020-06-18 HISTORY — PX: ABDOMINAL AORTOGRAM W/LOWER EXTREMITY: CATH118223

## 2020-06-18 HISTORY — PX: PERIPHERAL VASCULAR INTERVENTION: CATH118257

## 2020-06-18 LAB — POCT ACTIVATED CLOTTING TIME
Activated Clotting Time: 214 seconds
Activated Clotting Time: 261 seconds
Activated Clotting Time: 190 seconds
Activated Clotting Time: 160 seconds

## 2020-06-18 LAB — POCT I-STAT, CHEM 8
BUN: 16 mg/dL (ref 6–20)
BUN: 17 mg/dL (ref 6–20)
Calcium, Ion: 1.19 mmol/L (ref 1.15–1.40)
Calcium, Ion: 1.21 mmol/L (ref 1.15–1.40)
Chloride: 101 mmol/L (ref 98–111)
Chloride: 102 mmol/L (ref 98–111)
Creatinine, Ser: 1.1 mg/dL (ref 0.61–1.24)
Creatinine, Ser: 1.1 mg/dL (ref 0.61–1.24)
Glucose, Bld: 376 mg/dL — ABNORMAL HIGH (ref 70–99)
Glucose, Bld: 382 mg/dL — ABNORMAL HIGH (ref 70–99)
HCT: 45 % (ref 39.0–52.0)
HCT: 46 % (ref 39.0–52.0)
Hemoglobin: 15.3 g/dL (ref 13.0–17.0)
Hemoglobin: 15.6 g/dL (ref 13.0–17.0)
Potassium: 4.1 mmol/L (ref 3.5–5.1)
Potassium: 4.2 mmol/L (ref 3.5–5.1)
Sodium: 137 mmol/L (ref 135–145)
Sodium: 137 mmol/L (ref 135–145)
TCO2: 25 mmol/L (ref 22–32)
TCO2: 27 mmol/L (ref 22–32)

## 2020-06-18 LAB — GLUCOSE, CAPILLARY
Glucose-Capillary: 284 mg/dL — ABNORMAL HIGH (ref 70–99)
Glucose-Capillary: 229 mg/dL — ABNORMAL HIGH (ref 70–99)

## 2020-06-18 SURGERY — ABDOMINAL AORTOGRAM W/LOWER EXTREMITY
Anesthesia: LOCAL

## 2020-06-18 MED ORDER — HEPARIN (PORCINE) IN NACL 1000-0.9 UT/500ML-% IV SOLN
INTRAVENOUS | Status: DC | PRN
  Administered 2020-06-18 (×2): 500 mL

## 2020-06-18 MED ORDER — SODIUM CHLORIDE 0.9% FLUSH: 3.0000 mL | INTRAVENOUS | Status: DC | PRN

## 2020-06-18 MED ORDER — LABETALOL HCL 5 MG/ML IV SOLN
INTRAVENOUS | Status: DC | PRN
  Administered 2020-06-18 (×2): 10 mg via INTRAVENOUS

## 2020-06-18 MED ORDER — SODIUM CHLORIDE 0.9 % IV SOLN: 250.0000 mL | INTRAVENOUS | Status: DC | PRN

## 2020-06-18 MED ORDER — CLOPIDOGREL BISULFATE 300 MG PO TABS
ORAL_TABLET | ORAL | Status: DC | PRN
  Administered 2020-06-18: 300 mg via ORAL

## 2020-06-18 MED ORDER — ACETAMINOPHEN 325 MG PO TABS: 650.0000 mg | ORAL_TABLET | ORAL | Status: DC | PRN

## 2020-06-18 MED ORDER — MIDAZOLAM HCL 2 MG/2ML IJ SOLN
INTRAMUSCULAR | Status: DC | PRN
  Administered 2020-06-18 (×2): 1 mg via INTRAVENOUS

## 2020-06-18 MED ORDER — CLOPIDOGREL BISULFATE 75 MG PO TABS
75.0000 mg | ORAL_TABLET | Freq: Every day | ORAL | 11 refills | Status: DC
  Filled 2021-01-19 – 2021-05-10 (×3): qty 30, 30d supply, fill #0

## 2020-06-18 MED ORDER — FENTANYL CITRATE (PF) 100 MCG/2ML IJ SOLN
INTRAMUSCULAR | Status: AC
  Filled 2020-06-18: qty 2

## 2020-06-18 MED ORDER — MIDAZOLAM HCL 2 MG/2ML IJ SOLN
INTRAMUSCULAR | Status: AC
  Filled 2020-06-18: qty 2

## 2020-06-18 MED ORDER — LABETALOL HCL 5 MG/ML IV SOLN
INTRAVENOUS | Status: AC
  Filled 2020-06-18: qty 4

## 2020-06-18 MED ORDER — CLOPIDOGREL BISULFATE 75 MG PO TABS: 300.0000 mg | ORAL_TABLET | Freq: Once | ORAL | Status: DC

## 2020-06-18 MED ORDER — HYDRALAZINE HCL 20 MG/ML IJ SOLN
INTRAMUSCULAR | Status: AC
  Filled 2020-06-18: qty 1

## 2020-06-18 MED ORDER — LIDOCAINE HCL (PF) 1 % IJ SOLN
INTRAMUSCULAR | Status: DC | PRN
  Administered 2020-06-18 (×2): 15 mL via INTRADERMAL

## 2020-06-18 MED ORDER — CLOPIDOGREL BISULFATE 75 MG PO TABS: 75.0000 mg | ORAL_TABLET | Freq: Every day | ORAL | Status: DC

## 2020-06-18 MED ORDER — SODIUM CHLORIDE 0.9% FLUSH: 3.0000 mL | Freq: Two times a day (BID) | INTRAVENOUS | Status: DC

## 2020-06-18 MED ORDER — SODIUM CHLORIDE 0.9 % IV SOLN: INTRAVENOUS | Status: DC

## 2020-06-18 MED ORDER — SODIUM CHLORIDE 0.9 % WEIGHT BASED INFUSION: 1.0000 mL/kg/h | INTRAVENOUS | Status: DC

## 2020-06-18 MED ORDER — CLOPIDOGREL BISULFATE 300 MG PO TABS
ORAL_TABLET | ORAL | Status: AC
  Filled 2020-06-18: qty 1

## 2020-06-18 MED ORDER — HEPARIN (PORCINE) IN NACL 1000-0.9 UT/500ML-% IV SOLN
INTRAVENOUS | Status: AC
  Filled 2020-06-18: qty 500

## 2020-06-18 MED ORDER — HEPARIN SODIUM (PORCINE) 1000 UNIT/ML IJ SOLN
INTRAMUSCULAR | Status: AC
  Filled 2020-06-18: qty 1

## 2020-06-18 MED ORDER — IODIXANOL 320 MG/ML IV SOLN
INTRAVENOUS | Status: DC | PRN
  Administered 2020-06-18: 145 mL via INTRA_ARTERIAL

## 2020-06-18 MED ORDER — LIDOCAINE HCL (PF) 1 % IJ SOLN
INTRAMUSCULAR | Status: AC
  Filled 2020-06-18: qty 30

## 2020-06-18 MED ORDER — HEPARIN SODIUM (PORCINE) 1000 UNIT/ML IJ SOLN
INTRAMUSCULAR | Status: DC | PRN
  Administered 2020-06-18: 7000 [IU] via INTRAVENOUS

## 2020-06-18 MED ORDER — FENTANYL CITRATE (PF) 100 MCG/2ML IJ SOLN
INTRAMUSCULAR | Status: DC | PRN
  Administered 2020-06-18 (×2): 25 ug via INTRAVENOUS

## 2020-06-18 MED ORDER — HYDRALAZINE HCL 20 MG/ML IJ SOLN
5.0000 mg | INTRAMUSCULAR | Status: DC | PRN
  Administered 2020-06-18: 5 mg via INTRAVENOUS

## 2020-06-18 MED ORDER — ONDANSETRON HCL 4 MG/2ML IJ SOLN: 4.0000 mg | Freq: Four times a day (QID) | INTRAMUSCULAR | Status: DC | PRN

## 2020-06-18 MED ORDER — LABETALOL HCL 5 MG/ML IV SOLN
10.0000 mg | INTRAVENOUS | Status: DC | PRN
  Administered 2020-06-18 (×2): 10 mg via INTRAVENOUS

## 2020-06-18 SURGICAL SUPPLY — 20 items
BAG SNAP BAND KOVER 36X36 (MISCELLANEOUS) ×3 IMPLANT
BALLN MUSTANG 7.0X20 75 (BALLOONS) ×6
BALLOON MUSTANG 7.0X20 75 (BALLOONS) ×4 IMPLANT
CATH ANGIO 5F BER2 65CM (CATHETERS) ×3 IMPLANT
CATH OMNI FLUSH 5F 65CM (CATHETERS) ×3 IMPLANT
DEVICE CONTINUOUS FLUSH (MISCELLANEOUS) ×3 IMPLANT
GLIDEWIRE ADV .035X180CM (WIRE) ×3 IMPLANT
KIT ENCORE 26 ADVANTAGE (KITS) ×6 IMPLANT
KIT MICROPUNCTURE NIT STIFF (SHEATH) ×3 IMPLANT
KIT PV (KITS) ×3 IMPLANT
SHEATH BRITE TIP 7FR 35CM (SHEATH) ×6 IMPLANT
SHEATH PINNACLE 5F 10CM (SHEATH) ×6 IMPLANT
SHEATH PROBE COVER 6X72 (BAG) ×3 IMPLANT
STENT VIABAHN 7X29X80 VBX (Permanent Stent) ×3 IMPLANT
STENT VIABAHN VBX 7X59X80 (Permanent Stent) ×3 IMPLANT
SYR MEDRAD MARK V 150ML (SYRINGE) ×3 IMPLANT
TRANSDUCER W/STOPCOCK (MISCELLANEOUS) ×3 IMPLANT
TRAY PV CATH (CUSTOM PROCEDURE TRAY) ×3 IMPLANT
WIRE ROSEN-J .035X180CM (WIRE) ×3 IMPLANT
WIRE STARTER BENTSON 035X150 (WIRE) ×3 IMPLANT

## 2020-06-18 NOTE — Progress Notes (Signed)
Site Area:  RFA, LFA Site prior to removal: Level 0 Pressure applied for:  20 minutes Manual:  yes Pt status during pull: stable Post pull site:  Level 0 Post pull instructions given:  yes Post pull pulses present:  DP/PT doppler bilat Dressing applied:  Gauze/tegaderm Bedrest begins @:  1505 Comments: Removed by LB Elijah Birk RN, Palma Holter RN

## 2020-06-18 NOTE — Op Note (Signed)
Patient name: Dylan Anderson MRN: [id number] DOB: 10/06/1960 Sex: male  06/18/2020 Pre-operative Diagnosis: Left lower extremity short distance lifestyle limiting claudication with history of left common iliac artery stent approximately 8 years ago Post-operative diagnosis:  Same Surgeon:  Cephus Shelling, MD Procedure Performed: 1.  Ultrasound-guided access of right common femoral artery 2.  Aortogram including catheter selection of aorta 3.  Ultrasound-guided access of left common femoral artery 4.  Angioplasty and stent of bilateral common iliac arteries (7 mm x 29 mm VBX right common iliac artery and 7 mm x 59 mm VBX left common iliac artery, both stents postdilated with 7 mm x 20 mm Mustang's to over size stent)  5.  Bilateral lower extremity arteriogram with runoff 6.  53 minutes of monitored moderate conscious sedation time  Indications: Patient is a 60 year old male who was seen with worsening lifestyle limiting short distance claudication of the left leg.  He has a history of left common iliac artery stent at [facility] approximately 8 years ago.  He had a drop in his ABIs to 0.5 in the left foot.  He presents today for arteriogram possible intervention after risk benefits discussed.  Findings:   Infrarenal aorta was widely patent but small.  Aortogram confirmed that the left common iliac artery stent was completely occluded.  Right common iliac artery appeared moderately diseased without flow-limiting stenosis.  Bilateral lower extremity runoff showed patent common femoral, profunda, SFA, above and below-knee popliteal artery bilaterally.  On the left he had three-vessel runoff on the right he had two-vessel runoff via anterior tibial and posterior tibial with an occluded peroneal.  Ultimately, I was able to cross the left common iliac artery occlusion retrograde from the left groin common femoral artery access.  I then placed a 7 mm x 29 mm VBX on the right and a 7 mm x 59 mm  VBX on the left and had to stent both iliacs given I had to slightly raise the bifurcation in order to appropriately treat flush occlusion of the left common iliac artery stent.  I did post dilate both iliac artery stents proximally with 7 mm Mustang's given on the left appeared to be some residual disease with not complete expansion of the stent.  Excellent results after post-dilation with no residual stenosis.   Procedure:  The patient was identified in the holding area and taken to room 8.  The patient was then placed supine on the table and prepped and draped in the usual sterile fashion.  A time out was called.  Ultrasound was used to evaluate the right common femoral artery.  It was patent .  A digital ultrasound image was acquired.  A micropuncture needle was used to access the right common femoral artery under ultrasound guidance.  An 018 wire was advanced without resistance and a micropuncture sheath was placed.  The 018 wire was removed and a benson wire was placed.  The micropuncture sheath was exchanged for a 5 french sheath.  An omniflush catheter was advanced over the wire to the level of L-1.  An abdominal angiogram was obtained.  This confirmed the left common iliac artery stent was occluded.  There was reconstitution of a external iliac and common femoral.  I then elected to evaluate the left common femoral artery with ultrasound.  It was patent.  A digital image was saved and I accessed the left common femoral artery with a micro access needle and placed a microwire and micro sheath.  I then was able to cross the left common iliac occluded stent retrograde with a Glidewire advantage.  A 5 french sheath was placed in the left common femoral artery.  I then gave the patient 100 units per Kg IV heparin.  I then upsized with a 7 [place] Brite tip sheath in both common femoral arteries.  We then got some dedicated imaging in order to mark the right hypogastric given the left hypogastric appeared  occluded.  The left hypogastric artery filled retrograde from a lumbar and IMA.  I wanted to make sure to preserve the right hypogastric artery.  I then selected a 7 mm x 29 mm VBX on the right and a 7 mm x 59 mm VBX on the left that were then placed in each sheaths into position and then dilated to nominal pressure in both common iliac arteries.  I then shot bilateral iliac arteriograms and lower extremity runoff after put a Omni Flush catheter back up in the aorta.  I thought that the common iliac stent on the left needed post dilation to oversize it in order to get appropriate stent expansion.  I then used 7 mm x 20 mm Mustang's in both proximal iliac stents and these were inflated above nominal pressure to oversize stents proximally with excellent results.  Ultimately I did not feel mynx closure was possible given he is very skinny.  Wires and catheters were removed and both sheath were pulled down to the external iliac arteries.  Taken to holding to have sheaths removed and remained stable.   Plan: Patient will be loaded on Plavix and need dual antiplatelet therapy and statin.  Will arrange follow-up in 1 month with aortoiliac duplex and ABIs in the office.  Cephus Shelling, MD [facility] [place] Office: [phone]

## 2020-06-18 NOTE — Discharge Instructions (Signed)
Angiogram, Care After HOLD METFORMIN TILL Sunday 3/20  This sheet gives you information about how to care for yourself after your procedure. Your health care provider may also give you more specific instructions. If you have problems or questions, contact your health care provider. What can I expect after the procedure? After the procedure, it is common to have:  Bruising and tenderness at the catheter insertion area.  A collection of blood (hematoma) at the insertion area. This may feel like a small lump under the skin at the insertion site. Follow these instructions at home: Insertion site care  Follow instructions from your health care provider about how to take care of your insertion site. Make sure you: ? Wash your hands with soap and water before and after you change your bandage (dressing). If soap and water are not available, use hand sanitizer. ? Change your dressing as told by your health care provider.  Do not take baths, swim, or use a hot tub until your health care provider approves.  You may shower 24-48 hours after the procedure, or as told by your health care provider. To clean the insertion site: ? Gently wash the area with plain soap and water. ? Pat the area dry with a clean towel. ? Do not rub the site. This may cause bleeding.  Check your insertion site every day for signs of infection. Check for: ? Redness, swelling, or pain. ? Fluid or blood. ? Warmth. ? Pus or a bad smell.  Do not apply powder or lotion to the site. Keep the site clean and dry.   Activity  Do not drive for 24 hours if you were given a sedative during your procedure.  Rest as told by your health care provider, usually for 1-2 days.  Do not lift anything that is heavier than 10 lb (4.5 kg), or the limit that you are told, until your health care provider says that it is safe.  If the insertion site was in your leg, try to avoid stairs for a few days.  Return to your normal activities as  told by your health care provider, usually in about a week. Ask your health care provider what activities are safe for you. General instructions  If your insertion site starts bleeding, lie flat and put pressure on the site. If the bleeding does not stop, get help right away. This is a medical emergency.  Take over-the-counter and prescription medicines only as told by your health care provider.  Drink enough fluid to keep your urine pale yellow. This helps flush the contrast dye from your body.  Keep all follow-up visits as told by your health care provider. This is important.   Contact a health care provider if:  You have a fever or chills.  You have redness, swelling, or pain around your insertion site.  You have fluid or blood coming from your insertion site.  Your insertion site feels warm to the touch.  You have pus or a bad smell coming from your insertion site.  You have more bruising around the insertion site. Get help right away if you have:  A problem with the insertion area, such as: ? The area swells fast or bleeds even after you apply pressure. ? The area becomes pale, cool, tingly, or numb.  Chest pain.  Trouble breathing.  A rash.  Any symptoms of a stroke. "BE FAST" is an easy way to remember the main warning signs of a stroke: ? B - Balance.  Signs are dizziness, sudden trouble walking, or loss of balance. ? E - Eyes. Signs are trouble seeing or a sudden change in vision. ? F - Face. Signs are sudden weakness or loss of feeling of the face, or the face or eyelid drooping on one side. ? A - Arms. Signs are weakness or loss of feeling in an arm. This happens suddenly and usually on one side of the body. ? S - Speech. Signs are sudden trouble speaking, slurred speech, or trouble understanding what people say. ? T - Time. Time to call emergency services. Write down what time symptoms started.  You have other signs of a stroke, such as: ? A sudden, severe  headache with no known cause. ? Nausea or vomiting. ? Seizure. These symptoms may represent a serious problem that is an emergency. Do not wait to see if the symptoms will go away. Get medical help right away. Call your local emergency services (911 in the [place] Do not drive yourself to the hospital. Summary  It is common to have bruising and tenderness at the catheter insertion area.  Do not take baths, swim, or use a hot tub until your health care provider approves. You may shower 24-48 hours after the procedure or as told.  It is important to rest and drink plenty of fluids.  If the insertion site bleeds, lie flat and put pressure on the site. If the bleeding continues, get help right away. This is a medical emergency. This information is not intended to replace advice given to you by your health care provider. Make sure you discuss any questions you have with your health care provider. Document Revised: 01/23/2019 Document Reviewed: 01/23/2019 Elsevier Patient Education  2021 [employer].

## 2020-06-18 NOTE — H&P (Signed)
History and Physical Interval Note:  06/18/2020 9:41 AM  Dylan Anderson  has presented today for surgery, with the diagnosis of pvd.  The various methods of treatment have been discussed with the patient and family. After consideration of risks, benefits and other options for treatment, the patient has consented to  Procedure(s): ABDOMINAL AORTOGRAM W/LOWER EXTREMITY (N/A) as a surgical intervention.  The patient's history has been reviewed, patient examined, no change in status, stable for surgery.  I have reviewed the patient's chart and labs.  Questions were answered to the patient's satisfaction.    Aortogram, lower extremity arteriogram, hx left iliac stent, short distance lifestyle limiting claudication left leg.  [name]  Office Note     CC:  follow up Requesting Provider:  Leary RocaWallace, Catherine Laur*  HPI: Dylan IdaBill Florance is a 60 y.o. ([birth year]) male who presents follow up peripheral artery occlusive disease. He previously had a left common iliac stent [facility] several years ago.  He was last seen on February 25, 2019 with plans to follow-up in 1 year.  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  He is complaining of bilateral lower extremity claudication of approximately 40 to 50 yards.  Does have some pain at nighttime but this does not sound like classic rest pain.  He states he has noticed skin cracks along the left lateral plantar foot surface.  He is diabetic and states his previous A1c was 11 now down to 9.  He states his glucose averages approximately 170. He is working at quitting smoking and is using nicotine patches.  He dates he is down to less than 4 cigarettes on most days and some days does not smoke at all. He is compliant with daily aspirin and statin.       Past Medical History:  Diagnosis Date  . Acute gout involving toe of left foot 09/21/2017  . Arthritis   . Back pain   . Chest pain in adult  05/26/2018  . Chronic cough 07/13/2017  . Claudication (HCC)   . Diabetes mellitus type II, non insulin dependent (HCC) 07/10/2012  . Erectile dysfunction   . Gastroesophageal reflux disease 04/27/2017  . Hand pain 03/18/2017  . Healthcare maintenance 07/12/2017  . Hip pain 05/20/2017  . Hyperlipidemia 01/25/2017  . Hypertension 07/10/2012  . Insomnia 02/06/2017  . Low back pain 01/25/2017  . Peripheral artery disease (HCC) 06/07/2017  . PVD (peripheral vascular disease) (HCC)   . Tobacco abuse 07/10/2012  . Type 2 diabetes mellitus (HCC)          Past Surgical History:  Procedure Laterality Date  . ILIAC ARTERY STENT    . LEFT HEART CATH AND CORONARY ANGIOGRAPHY N/A 06/19/2018   Procedure: LEFT HEART CATH AND CORONARY ANGIOGRAPHY;  Surgeon: Lennette BihariKelly, Thomas A, MD;  Location: [facility];  Service: Cardiovascular;  Laterality: N/A;  . stent Left    left common and left internal illiac stent    Social History        Socioeconomic History  . Marital status: Legally Separated    Spouse name: Not on file  . Number of children: Not on file  . Years of education: Not on file  . Highest education level: Not on file  Occupational History  . Occupation: unemployed  Tobacco Use  . Smoking status: Current Every Day Smoker    Packs/day: 0.50    Years: 17.00    Pack  years: 8.50    Types: Cigarettes    Start date: 04/04/1978  . Smokeless tobacco: Never Used  . Tobacco comment: Max 1 ppd  Vaping Use  . Vaping Use: Never used  Substance and Sexual Activity  . Alcohol use: Yes    Comment: 2-3 mixed drinks 3 times per week  . Drug use: No  . Sexual activity: Yes    Partners: Female  Other Topics Concern  . Not on file  Social History Narrative  . Not on file   Social Determinants of Health   Financial Resource Strain: Not on file  Food Insecurity: Not on file  Transportation Needs: Not on file  Physical Activity: Not on file  Stress: Not on file   Social Connections: Not on file  Intimate Partner Violence: Not on file        Family History  Problem Relation Age of Onset  . Heart failure Mother   . Osteoarthritis Mother   . COPD Sister   . Stroke Sister   . Heart attack Maternal Aunt   . Heart attack Maternal Uncle           Current Outpatient Medications  Medication Sig Dispense Refill  . amLODipine (NORVASC) 5 MG tablet Take 1 tablet (5 mg total) by mouth daily. 90 tablet 1  . aspirin EC 81 MG tablet Take 1 tablet (81 mg total) by mouth daily. 120 tablet 0  . atorvastatin (LIPITOR) 80 MG tablet Take 1 tablet (80 mg total) by mouth daily at 6 PM. 90 tablet 0  . buPROPion (WELLBUTRIN SR) 200 MG 12 hr tablet Take 1 tablet (200 mg total) by mouth 2 (two) times daily. 180 tablet 0  . Evolocumab (REPATHA SURECLICK) 140 MG/ML SOAJ Inject 140 mg into the skin every 14 (fourteen) days. 2 pen 0  . fenofibrate (TRICOR) 48 MG tablet Take 1 tablet (48 mg total) by mouth daily. 90 tablet 0  . glucose blood (TRUE METRIX BLOOD GLUCOSE TEST) test strip Use as instructed. Check blood glucose level by fingerstick three times per day. E11.65 200 each 6  . insulin glargine (LANTUS) 100 UNIT/ML Solostar Pen Inject 70 Units into the skin daily. Inject 38 units in skin morning and inject 38 units in skin at bedtime. 15 mL 2  . liraglutide (VICTOZA) 18 MG/3ML SOPN Start 0.6mg  SQ daily x7days, then 1.2mg  daily x7 days, then 1.8mg  daily thereafter. 6 mL 1  . metFORMIN (GLUCOPHAGE) 1000 MG tablet Take 1 tablet (1,000 mg total) by mouth 2 (two) times daily with a meal. 180 tablet 0  . metoprolol succinate (TOPROL-XL) 50 MG 24 hr tablet Take 1 tablet (50 mg total) by mouth at bedtime. 90 tablet 1  . nicotine (NICODERM CQ - DOSED IN MG/24 HOURS) 14 mg/24hr patch Place 1 patch (14 mg total) onto the skin daily. 30 patch 0  . nitroGLYCERIN (NITROSTAT) 0.4 MG SL tablet Place 1 tablet (0.4 mg total) under the tongue every 5 (five) minutes as needed for  chest pain. 90 tablet 3  . NOVOLOG FLEXPEN 100 UNIT/ML FlexPen INJECT 10 UNITS INTO THE SKIN 2 (TWO) TIMES DAILY WITH A MEAL. 6 mL 6  . pantoprazole (PROTONIX) 40 MG tablet Take 1 tablet (40 mg total) by mouth daily. 90 tablet 0  . pregabalin (LYRICA) 100 MG capsule Take 1 capsule (100 mg total) by mouth 3 (three) times daily. 90 capsule 2  . tamsulosin (FLOMAX) 0.4 MG CAPS capsule Take 1 capsule (0.4 mg total) by  mouth daily. 90 capsule 0  . TRUEplus Lancets 28G MISC Use as instructed. Check blood glucose level by fingerstick 3 times per day. E11.65 200 each 6  . TRUEPLUS PEN NEEDLES 31G X 8 MM MISC USE AS DIRECTED WITH INSULIN 100 each 2   No current facility-administered medications for this visit.    No Known Allergies   REVIEW OF SYSTEMS:   [X]  denotes positive finding, [ ]  denotes negative finding Cardiac  Comments:  Chest pain or chest pressure:    Shortness of breath upon exertion:    Short of breath when lying flat:    Irregular heart rhythm:        Vascular    Pain in calf, thigh, or hip brought on by ambulation:    Pain in feet at night that wakes you up from your sleep:     Blood clot in your veins:    Leg swelling:         Pulmonary    Oxygen at home:    Productive cough:     Wheezing:         Neurologic    Sudden weakness in arms or legs:     Sudden numbness in arms or legs:     Sudden onset of difficulty speaking or slurred speech:    Temporary loss of vision in one eye:     Problems with dizziness:         Gastrointestinal    Blood in stool:     Vomited blood:         Genitourinary    Burning when urinating:     Blood in urine:        Psychiatric    Major depression:         Hematologic    Bleeding problems:    Problems with blood clotting too easily:        Skin    Rashes or ulcers:        Constitutional    Fever or chills:       PHYSICAL EXAMINATION:     Vitals:   06/11/20 1452  BP: 133/80  Pulse: 86  Resp: 20  Temp: 98 F (36.7 C)  TempSrc: Temporal  SpO2: 100%  Weight: 151 lb 4.8 oz (68.6 kg)  Height: 5\' 9"  (1.753 m)    General:  WDWN in NAD; vital signs documented above Gait: Unaided, no ataxia HENT: WNL, normocephalic Pulmonary: normal non-labored breathing Cardiac: regular HR Abdomen: soft, NT, no masses Skin: without rashes Vascular Exam/Pulses: 2+ right dorsalis pedis pulse.  I cannot palpate his left pedal pulses or femoral pulse. Extremities: with ischemic changes> purple discoloration to the plantar surface of his left fifth toe, without Gangrene , without cellulitis; without open wounds; several small fissures along the plantar aspect of his left lateral foot Musculoskeletal: no muscle wasting or atrophy       Neurologic: A&O X 3;  No focal weakness or paresthesias are detected Psychiatric:  The pt has Normal affect.        Non-Invasive Vascular Imaging:   06/11/2020 ABI/TBIToday's ABIToday's TBIPrevious ABIPrevious TBI  +-------+-----------+-----------+------------+------------+  Right 1.15    0.99    1.08    0.65      +-------+-----------+-----------+------------+------------+  Left  0.51    0.2    0.95    0.24      +-------+-----------+-----------+------------+------------+    ASSESSMENT/PLAN:: 60 y.o. male here for follow up for peripheral arterial disease.  He has had  a drop in his left ABI from 0.95-0.51.  He has bilateral lower extremity pain with exercise.  He has history of left common iliac artery stent placed [facility] several years ago.  He did not get arterial duplex examination today.  I discussed ABIs and physical exam findings with Dr. Darrick Penna.  We will make arrangements for aortogram with left lower extremity runoff with or without intervention with next available surgeon.  The patient is in agreement  with this plan.  Milinda Antis, PA-C [facility] [phone]  Clinic MD:   Darrick Penna

## 2020-06-19 ENCOUNTER — Encounter (HOSPITAL_COMMUNITY): Payer: Self-pay | Admitting: Vascular Surgery

## 2020-06-23 MED FILL — ?CLOPIDOGREL 75MG TABL: 75 | 30 days supply | Qty: 30 | Fill #0

## 2020-06-24 ENCOUNTER — Other Ambulatory Visit: Payer: Self-pay | Admitting: Nurse Practitioner

## 2020-06-24 ENCOUNTER — Telehealth: Payer: Self-pay | Admitting: Internal Medicine

## 2020-06-24 ENCOUNTER — Other Ambulatory Visit: Payer: Self-pay | Admitting: Cardiology

## 2020-06-24 DIAGNOSIS — I10 Essential (primary) hypertension: Secondary | ICD-10-CM

## 2020-06-24 MED FILL — TAMSULOSIN HCL 0.4 MG CAP: 0.4 | 30 days supply | Qty: 30 | Fill #1

## 2020-06-24 MED FILL — PREGABALIN 75 MG CAPS: 75 | 30 days supply | Qty: 90 | Fill #1

## 2020-06-24 MED FILL — BUPROPION HCL SR 200 MG TAB: 200 | 30 days supply | Qty: 60 | Fill #1

## 2020-06-24 MED FILL — ?FENOFIBRATE 48 MG TABLETS: 48 | 30 days supply | Qty: 30 | Fill #1

## 2020-06-24 MED FILL — PANTOPRAZOLE SOD DR 40 MG T: 40 | 30 days supply | Qty: 30 | Fill #1

## 2020-06-24 MED FILL — NICOTINE 14 MG/24HR PATCH: 14 | 28 days supply | Qty: 28 | Fill #0

## 2020-06-24 MED FILL — TRUEPLUS PEN NDL 31GX5/16: 31G X 8 MM | 25 days supply | Qty: 100 | Fill #0

## 2020-06-24 MED FILL — ?METFORMIN HCL 1000 MG TAB: 1000 | 30 days supply | Qty: 60 | Fill #1

## 2020-06-24 MED FILL — ATORVASTATIN CALCIUM 80 MG: 80 | 30 days supply | Qty: 30 | Fill #1

## 2020-06-24 NOTE — Telephone Encounter (Signed)
Requested medication (s) are due for refill today: expired medication  Requested medication (s) are on the active medication list: yes  Last refill:  04/24/19 #90 1 refills  Future visit scheduled: no   Notes to clinic:  expired medication. Do you want to renew Rx?     Requested Prescriptions  Pending Prescriptions Disp Refills   amLODipine (NORVASC) 5 MG tablet [Pharmacy Med Name: AMLODIPINE BESYLATE 5 MG TA 5 Tablet] 30 tablet 3    Sig: Take 1 tablet (5 mg total) by mouth daily.      Cardiovascular:  Calcium Channel Blockers Failed - 06/24/2020  3:50 PM      Failed - Last BP in normal range    BP Readings from Last 1 Encounters:  06/18/20 (!) 151/78          Passed - Valid encounter within last 6 months    Recent Outpatient Visits           1 month ago DM (diabetes mellitus), type 2, uncontrolled with complications [facility])   [facility] [place], Jeannett Senior L, RPH-CPP   3 months ago DM (diabetes mellitus), type 2, uncontrolled with complications [facility])   [facility] Lois Huxley, Cornelius Moras, RPH-CPP       Future Appointments             In 2 weeks Lois Huxley, Cornelius Moras, RPH-CPP [facility]

## 2020-06-24 NOTE — Telephone Encounter (Signed)
Pt called says lost all medications on vacation and hotel says they cannot find them. Pt says he has PLAVIX & INSULIN but needs  Needles for insulin pens True Plus Pen needles. [facility] per pt

## 2020-06-25 ENCOUNTER — Other Ambulatory Visit: Payer: Self-pay | Admitting: Internal Medicine

## 2020-06-25 MED FILL — METOPROLOL SUCCINATE ER 50: 50 | 30 days supply | Qty: 30 | Fill #0

## 2020-06-25 MED FILL — AMLODIPINE BESYLATE 5 MG TA: 5 | 30 days supply | Qty: 30 | Fill #0

## 2020-06-25 NOTE — Telephone Encounter (Signed)
Will forward to provider  

## 2020-06-26 ENCOUNTER — Other Ambulatory Visit: Payer: Self-pay | Admitting: Internal Medicine

## 2020-06-26 DIAGNOSIS — IMO0002 Reserved for concepts with insufficient information to code with codable children: Secondary | ICD-10-CM

## 2020-06-26 DIAGNOSIS — E1165 Type 2 diabetes mellitus with hyperglycemia: Secondary | ICD-10-CM

## 2020-06-26 MED ORDER — TRUEPLUS PEN NEEDLES 31G X 8 MM MISC: 2 refills | Status: DC

## 2020-06-26 NOTE — Telephone Encounter (Signed)
I sent in the pen needles as requested.   Marcy Siren, D.O. Primary [facility] at [facility]  06/26/2020, 10:09 AM

## 2020-07-04 ENCOUNTER — Other Ambulatory Visit: Payer: Self-pay

## 2020-07-07 ENCOUNTER — Other Ambulatory Visit: Payer: Self-pay

## 2020-07-07 DIAGNOSIS — I779 Disorder of arteries and arterioles, unspecified: Secondary | ICD-10-CM

## 2020-07-07 DIAGNOSIS — Z95828 Presence of other vascular implants and grafts: Secondary | ICD-10-CM

## 2020-07-09 ENCOUNTER — Ambulatory Visit: Payer: Self-pay | Admitting: Pharmacist

## 2020-07-22 ENCOUNTER — Encounter (HOSPITAL_COMMUNITY): Payer: Self-pay

## 2020-10-03 MED FILL — Tamsulosin HCl Cap 0.4 MG: ORAL | 30 days supply | Qty: 30 | Fill #0 | Status: AC

## 2020-10-03 MED FILL — Pregabalin Cap 75 MG: ORAL | 30 days supply | Qty: 90 | Fill #0 | Status: AC

## 2020-10-06 ENCOUNTER — Other Ambulatory Visit: Payer: Self-pay

## 2020-10-07 ENCOUNTER — Other Ambulatory Visit: Payer: Self-pay

## 2020-11-02 ENCOUNTER — Other Ambulatory Visit: Payer: Self-pay

## 2020-12-30 ENCOUNTER — Emergency Department (HOSPITAL_BASED_OUTPATIENT_CLINIC_OR_DEPARTMENT_OTHER): Payer: Medicaid Other

## 2020-12-30 ENCOUNTER — Other Ambulatory Visit: Payer: Self-pay

## 2020-12-30 ENCOUNTER — Encounter (HOSPITAL_BASED_OUTPATIENT_CLINIC_OR_DEPARTMENT_OTHER): Payer: Self-pay

## 2020-12-30 ENCOUNTER — Emergency Department (HOSPITAL_BASED_OUTPATIENT_CLINIC_OR_DEPARTMENT_OTHER)
Admission: EM | Admit: 2020-12-30 | Discharge: 2020-12-30 | Disposition: A | Discharge status: Home / Self Care | Payer: Medicaid Other | Attending: Student | Admitting: Student

## 2020-12-30 DIAGNOSIS — S99921A Unspecified injury of right foot, initial encounter: Secondary | ICD-10-CM | POA: Diagnosis present

## 2020-12-30 DIAGNOSIS — Z7984 Long term (current) use of oral hypoglycemic drugs: Secondary | ICD-10-CM | POA: Insufficient documentation

## 2020-12-30 DIAGNOSIS — E119 Type 2 diabetes mellitus without complications: Secondary | ICD-10-CM | POA: Diagnosis not present

## 2020-12-30 DIAGNOSIS — F17210 Nicotine dependence, cigarettes, uncomplicated: Secondary | ICD-10-CM | POA: Diagnosis not present

## 2020-12-30 DIAGNOSIS — Z79899 Other long term (current) drug therapy: Secondary | ICD-10-CM | POA: Diagnosis not present

## 2020-12-30 DIAGNOSIS — S90121A Contusion of right lesser toe(s) without damage to nail, initial encounter: Secondary | ICD-10-CM | POA: Insufficient documentation

## 2020-12-30 DIAGNOSIS — I10 Essential (primary) hypertension: Secondary | ICD-10-CM | POA: Insufficient documentation

## 2020-12-30 DIAGNOSIS — Z794 Long term (current) use of insulin: Secondary | ICD-10-CM | POA: Insufficient documentation

## 2020-12-30 DIAGNOSIS — I2510 Atherosclerotic heart disease of native coronary artery without angina pectoris: Secondary | ICD-10-CM | POA: Insufficient documentation

## 2020-12-30 DIAGNOSIS — Z7982 Long term (current) use of aspirin: Secondary | ICD-10-CM | POA: Insufficient documentation

## 2020-12-30 DIAGNOSIS — S90111A Contusion of right great toe without damage to nail, initial encounter: Secondary | ICD-10-CM | POA: Insufficient documentation

## 2020-12-30 DIAGNOSIS — W1830XA Fall on same level, unspecified, initial encounter: Secondary | ICD-10-CM | POA: Insufficient documentation

## 2020-12-30 DIAGNOSIS — R000 Tachycardia, unspecified: Secondary | ICD-10-CM | POA: Insufficient documentation

## 2020-12-30 DIAGNOSIS — S0990XA Unspecified injury of head, initial encounter: Secondary | ICD-10-CM | POA: Diagnosis not present

## 2020-12-30 DIAGNOSIS — M5441 Lumbago with sciatica, right side: Secondary | ICD-10-CM | POA: Insufficient documentation

## 2020-12-30 LAB — CBC WITH DIFFERENTIAL/PLATELET
Abs Immature Granulocytes: 0.07 10*3/uL (ref 0.00–0.07)
Basophils Absolute: 0.1 10*3/uL (ref 0.0–0.1)
Basophils Relative: 1 %
Eosinophils Absolute: 0.2 10*3/uL (ref 0.0–0.5)
Eosinophils Relative: 2 %
HCT: 46.4 % (ref 39.0–52.0)
Hemoglobin: 15.9 g/dL (ref 13.0–17.0)
Immature Granulocytes: 1 %
Lymphocytes Relative: 29 %
Lymphs Abs: 2.5 10*3/uL (ref 0.7–4.0)
MCH: 28.9 pg (ref 26.0–34.0)
MCHC: 34.3 g/dL (ref 30.0–36.0)
MCV: 84.4 fL (ref 80.0–100.0)
Monocytes Absolute: 0.6 10*3/uL (ref 0.1–1.0)
Monocytes Relative: 7 %
Neutro Abs: 5.3 10*3/uL (ref 1.7–7.7)
Neutrophils Relative %: 60 %
Platelets: 340 10*3/uL (ref 150–400)
RBC: 5.5 MIL/uL (ref 4.22–5.81)
RDW: 12.7 % (ref 11.5–15.5)
WBC: 8.7 10*3/uL (ref 4.0–10.5)
nRBC: 0 % (ref 0.0–0.2)

## 2020-12-30 LAB — COMPREHENSIVE METABOLIC PANEL
ALT: 15 U/L (ref 0–44)
AST: 13 U/L — ABNORMAL LOW (ref 15–41)
Albumin: 3.3 g/dL — ABNORMAL LOW (ref 3.5–5.0)
Alkaline Phosphatase: 94 U/L (ref 38–126)
Anion gap: 9 (ref 5–15)
BUN: 17 mg/dL (ref 6–20)
CO2: 26 mmol/L (ref 22–32)
Calcium: 9.9 mg/dL (ref 8.9–10.3)
Chloride: 99 mmol/L (ref 98–111)
Creatinine, Ser: 1.22 mg/dL (ref 0.61–1.24)
GFR, Estimated: >60 mL/min (ref >60)
Glucose, Bld: 300 mg/dL — ABNORMAL HIGH (ref 70–99)
Potassium: 4.2 mmol/L (ref 3.5–5.1)
Sodium: 134 mmol/L — ABNORMAL LOW (ref 135–145)
Total Bilirubin: 0.4 mg/dL (ref 0.3–1.2)
Total Protein: 7.1 g/dL (ref 6.5–8.1)

## 2020-12-30 LAB — CBG MONITORING, ED
Glucose-Capillary: 287 mg/dL — ABNORMAL HIGH (ref 70–99)
Glucose-Capillary: 243 mg/dL — ABNORMAL HIGH (ref 70–99)

## 2020-12-30 LAB — PROTIME-INR
INR: 0.9 (ref 0.8–1.2)
Prothrombin Time: 12.1 seconds (ref 11.4–15.2)

## 2020-12-30 MED ORDER — OXYCODONE-ACETAMINOPHEN 5-325 MG PO TABS
1.0000 | ORAL_TABLET | Freq: Three times a day (TID) | ORAL | 0 refills | Status: AC | PRN
  Filled 2020-12-30: qty 9, 3d supply, fill #0

## 2020-12-30 MED ORDER — HYDROMORPHONE HCL 1 MG/ML IJ SOLN
0.5000 mg | Freq: Once | INTRAMUSCULAR | Status: AC
  Administered 2020-12-30: 0.5 mg via INTRAMUSCULAR
  Filled 2020-12-30: qty 1

## 2020-12-30 MED ORDER — CYCLOBENZAPRINE HCL 10 MG PO TABS
10.0000 mg | ORAL_TABLET | Freq: Two times a day (BID) | ORAL | 0 refills | Status: DC | PRN
  Filled 2020-12-30: qty 20, 10d supply, fill #0

## 2020-12-30 NOTE — ED Triage Notes (Signed)
Pt c/o lower back pain that radiates down bilat LE x 2 week-denies injury-NAD-to triage in w/c

## 2020-12-30 NOTE — ED Provider Notes (Signed)
[facility] EMERGENCY DEPARTMENT Provider Note   CSN: [id number] Arrival date & time: 12/30/20  1540     History Chief Complaint  Patient presents with   Back Pain    Dylan Anderson is a 60 y.o. male.  HPI  Patient with significant medical history of back pain, diabetes type 2, PVD, presents to the emergency department with chief complaint of lower back pain.  Patient states she has been having back pain for the last 2 years but over the last week his pain has gotten worse, he states he feels pain in his right+left lower back worse on the right versus left, pain will radiate down his legs bilaterally, will have occasional paresthesias in his feet which he states he has had this for years.  He denies saddle paresthesias, denies urinary incontinency, retention, difficult bowel movements, denies stomach pain, flank pain, urinary symptoms.  No history of IV drug use, no associated fevers, chills, chest pain, pedal edema, denies trauma to his back.  He does note that he has had multiple falls, for over the last month, his most recent was last week, he states typically he just loses his balance, states that he cannot feel with his feet and his back pain will make his right leg giving out causing him to fall.  He currently has no headaches, change in vision, paresthesias or weakness upper/ lower extremities, he denies neck pain, chest pain, abdominal pain.   Past Medical History:  Diagnosis Date   Acute gout involving toe of left foot 09/21/2017   Arthritis    Back pain    Chest pain in adult 05/26/2018   Chronic cough 07/13/2017   Claudication (HCC)    Diabetes mellitus type II, non insulin dependent (HCC) 07/10/2012   Erectile dysfunction    Gastroesophageal reflux disease 04/27/2017   Hand pain 03/18/2017   Healthcare maintenance 07/12/2017   Hip pain 05/20/2017   Hyperlipidemia 01/25/2017   Hypertension 07/10/2012   Insomnia 02/06/2017   Low back pain 01/25/2017   Peripheral artery  disease (HCC) 06/07/2017   PVD (peripheral vascular disease) (HCC)    Tobacco abuse 07/10/2012   Type 2 diabetes mellitus [facility])     Patient Active Problem List   Diagnosis Date Noted   Type 2 diabetes mellitus (HCC)    PVD (peripheral vascular disease) (HCC)    Arthritis    Back pain    Dizzy 09/21/2018   Mild CAD 07/08/2018   Abnormal cardiac CT angiography    PVC's (premature ventricular contractions) 05/28/2018   Chest pain in adult 05/26/2018   Claudication (HCC) 11/05/2017   Acute gout involving toe of left foot 09/21/2017   Chronic cough 07/13/2017   Healthcare maintenance 07/12/2017   Peripheral artery disease (HCC) 06/07/2017   Hip pain 05/20/2017   Gastroesophageal reflux disease 04/27/2017   Insomnia 02/06/2017   Hyperlipidemia 01/25/2017   Erectile dysfunction 01/25/2017   Low back pain 01/25/2017   Diabetes mellitus type II, non insulin dependent (HCC) 07/10/2012   Hypertension 07/10/2012   Tobacco abuse 07/10/2012    Past Surgical History:  Procedure Laterality Date   ABDOMINAL AORTOGRAM W/LOWER EXTREMITY N/A 06/18/2020   Procedure: ABDOMINAL AORTOGRAM W/LOWER EXTREMITY;  Surgeon: Cephus Shelling, MD;  Location: [facility] INVASIVE CV LAB;  Service: Cardiovascular;  Laterality: N/A;   ILIAC ARTERY STENT     LEFT HEART CATH AND CORONARY ANGIOGRAPHY N/A 06/19/2018   Procedure: LEFT HEART CATH AND CORONARY ANGIOGRAPHY;  Surgeon: Lennette Bihari, MD;  Location: [facility] INVASIVE CV LAB;  Service: Cardiovascular;  Laterality: N/A;   PERIPHERAL VASCULAR INTERVENTION Bilateral 06/18/2020   Procedure: PERIPHERAL VASCULAR INTERVENTION;  Surgeon: Cephus Shelling, MD;  Location: [facility] INVASIVE CV LAB;  Service: Cardiovascular;  Laterality: Bilateral;  common Iliac   stent Left    left common and left internal illiac stent       Family History  Problem Relation Age of Onset   Heart failure Mother    Osteoarthritis Mother    COPD Sister    Stroke Sister    Heart attack  Maternal Aunt    Heart attack Maternal Uncle     Social History   Tobacco Use   Smoking status: Every Day    Packs/day: 0.50    Years: 17.00    Pack years: 8.50    Types: Cigarettes    Start date: 04/04/1978   Smokeless tobacco: Never   Tobacco comments:    Max 1 ppd  Vaping Use   Vaping Use: Never used  Substance Use Topics   Alcohol use: Yes    Comment: occ   Drug use: No    Home Medications Prior to Admission medications   Medication Sig Start Date End Date Taking? Authorizing Provider  cyclobenzaprine (FLEXERIL) 10 MG tablet Take 1 tablet (10 mg total) by mouth 2 (two) times daily as needed for muscle spasms. 12/30/20  Yes Carroll Sage, PA-C  oxyCODONE-acetaminophen (PERCOCET/ROXICET) 5-325 MG tablet Take 1 tablet by mouth every 8 (eight) hours as needed for up to 3 days for severe pain. 12/30/20 01/02/21 Yes Carroll Sage, PA-C  amLODipine (NORVASC) 5 MG tablet TAKE 1 TABLET (5 MG TOTAL) BY MOUTH DAILY. 06/25/20 06/25/21  Arvilla Market, MD  aspirin 81 MG EC tablet TAKE 1 TABLET (81 MG TOTAL) BY MOUTH DAILY. 04/13/20 04/13/21  Rema Fendt, NP  aspirin EC 81 MG tablet Take 1 tablet (81 mg total) by mouth daily. 04/13/20   Rema Fendt, NP  atorvastatin (LIPITOR) 80 MG tablet TAKE 1 TABLET (80 MG TOTAL) BY MOUTH DAILY AT 6 PM. 04/13/20 04/13/21  Rema Fendt, NP  buPROPion (WELLBUTRIN SR) 200 MG 12 hr tablet TAKE 1 TABLET (200 MG TOTAL) BY MOUTH 2 (TWO) TIMES DAILY. 08/26/19 08/25/20  Arvilla Market, MD  clopidogrel (PLAVIX) 75 MG tablet Take 1 tablet (75 mg total) by mouth daily. 06/18/20 06/18/21  Cephus Shelling, MD  Evolocumab (REPATHA SURECLICK) 140 MG/ML SOAJ Inject 140 mg into the skin every 14 (fourteen) days. 05/02/19   Baldo Daub, MD  fenofibrate (TRICOR) 48 MG tablet Take 1 tablet (48 mg total) by mouth daily. 04/13/20   Rema Fendt, NP  glucose blood (TRUE METRIX BLOOD GLUCOSE TEST) test strip Use as instructed. Check blood  glucose level by fingerstick three times per day. E11.65 04/24/19   Claiborne Rigg, NP  insulin aspart (NOVOLOG) 100 UNIT/ML FlexPen INJECT 10 UNITS INTO THE SKIN 2 (TWO) TIMES DAILY WITH A MEAL. 05/12/20 05/12/21  Arvilla Market, MD  Insulin Glargine [facility] 100 UNIT/ML INJECT 70 UNITS INTO THE SKIN DAILY 03/10/20 03/10/21  Hoy Register, MD  insulin glargine (LANTUS) 100 UNIT/ML Solostar Pen Inject 70 Units into the skin daily. Inject 38 units in skin morning and inject 38 units in skin at bedtime. 03/10/20   Hoy Register, MD  Insulin Pen Needle 31G X 8 MM MISC USE AS DIRECTED WITH INSULIN 06/26/20 06/26/21  Arvilla Market, MD  liraglutide (  VICTOZA) 18 MG/3ML SOPN START 0.6MG  INTO THE SKIN DAILY FOR 7 DAYS,THEN 1.2MG  DAILY X7 DAYS, THEN 1.8MG  DAILY THEREAFTER. 03/10/20 03/10/21  Hoy Register, MD  metFORMIN (GLUCOPHAGE) 1000 MG tablet TAKE 1 TABLET (1,000 MG TOTAL) BY MOUTH 2 (TWO) TIMES DAILY WITH A MEAL. 04/13/20 04/13/21  Rema Fendt, NP  metoprolol succinate (TOPROL-XL) 50 MG 24 hr tablet TAKE 1 TABLET (50 MG TOTAL) BY MOUTH AT BEDTIME. 06/25/20 06/25/21  Hoy Register, MD  nicotine (NICODERM CQ - DOSED IN MG/24 HOURS) 14 mg/24hr patch PLACE 1 PATCH (14 MG TOTAL) ONTO THE SKIN DAILY. 06/24/20 06/24/21  Baldo Daub, MD  nitroGLYCERIN (NITROSTAT) 0.4 MG SL tablet PLACE 1 TABLET (0.4 MG TOTAL) UNDER THE TONGUE EVERY 5 (FIVE) MINUTES AS NEEDED FOR CHEST PAIN. 03/18/20 03/18/21  Baldo Daub, MD  pantoprazole (PROTONIX) 40 MG tablet TAKE 1 TABLET (40 MG TOTAL) BY MOUTH DAILY. 04/13/20 04/13/21  Rema Fendt, NP  pregabalin (LYRICA) 100 MG capsule TAKE 1 CAPSULE (100 MG TOTAL) BY MOUTH 3 (THREE) TIMES DAILY. 05/04/20 11/02/20  Arvilla Market, MD  pregabalin (LYRICA) 75 MG capsule TAKE 1 CAPSULE (75 MG TOTAL) BY MOUTH 3 (THREE) TIMES DAILY. 04/13/20 11/06/20  Rema Fendt, NP  tamsulosin (FLOMAX) 0.4 MG CAPS capsule TAKE 1 CAPSULE (0.4 MG TOTAL) BY MOUTH  DAILY. 02/21/20 02/20/21  Arvilla Market, MD  TRUEplus Lancets 28G MISC Use as instructed. Check blood glucose level by fingerstick 3 times per day. E11.65 04/24/19   Claiborne Rigg, NP  gabapentin (NEURONTIN) 300 MG capsule Take 2 capsules (600 mg total) by mouth 3 (three) times daily. 08/26/19 08/26/19  Arvilla Market, MD    Allergies    Patient has no known allergies.  Review of Systems   Review of Systems  Constitutional:  Negative for chills and fever.  HENT:  Negative for congestion.   Respiratory:  Negative for shortness of breath.   Cardiovascular:  Negative for chest pain.  Gastrointestinal:  Negative for abdominal pain.  Genitourinary:  Negative for enuresis.  Musculoskeletal:  Positive for back pain.  Skin:  Negative for rash.  Neurological:  Negative for dizziness, weakness and headaches.  Hematological:  Does not bruise/bleed easily.   Physical Exam Updated Vital Signs BP 111/86 (BP Location: Right Arm)   Pulse 87   Temp 97.9 F (36.6 C) (Oral)   Resp 15   Ht 5\' 9"  (1.753 m)   Wt 61.2 kg   SpO2 99%   BMI 19.94 kg/m   Physical Exam Vitals and nursing note reviewed.  Constitutional:      General: He is not in acute distress.    Appearance: He is not ill-appearing.  HENT:     Head: Normocephalic and atraumatic.     Comments: No deformities to head present, no raccoon eyes or battle sign noted.    Nose: No congestion.     Mouth/Throat:     Mouth: Mucous membranes are moist.     Pharynx: Oropharynx is clear.  Eyes:     Extraocular Movements: Extraocular movements intact.     Conjunctiva/sclera: Conjunctivae normal.     Pupils: Pupils are equal, round, and reactive to light.  Cardiovascular:     Rate and Rhythm: Normal rate and regular rhythm.     Pulses: Normal pulses.     Heart sounds: No murmur heard.   No friction rub. No gallop.  Pulmonary:     Effort: No respiratory distress.  Breath sounds: No wheezing, rhonchi or rales.   Chest:     Chest wall: No tenderness.  Abdominal:     Palpations: Abdomen is soft.     Tenderness: There is no abdominal tenderness. There is no right CVA tenderness or left CVA tenderness.  Musculoskeletal:     Cervical back: No tenderness.     Comments: Spine was nontender to palpation, no step-off or deformities present.  Does have notable pain within the musculature surrounding his right iliac crest, positive straight leg raise.  Patient has full range of motion 4 out of 5 strength both legs bilaterally, neurovascularly fully intact.  Does have ecchymosis noted on his right great toe as well as his right fifth toe, both are slightly tender to palpation no crepitus or deformities noted.    Skin:    General: Skin is warm and dry.  Neurological:     Mental Status: He is alert.     GCS: GCS eye subscore is 4. GCS verbal subscore is 5. GCS motor subscore is 6.     Sensory: Sensation is intact.     Motor: No weakness.     Coordination: Romberg sign negative. Finger-Nose-Finger Test normal.     Comments: Cranial nerves II through XII are grossly intact, no difficulty word finding, able to follow two-step commands, no unilateral weakness present.   Psychiatric:        Mood and Affect: Mood normal.    ED Results / Procedures / Treatments   Labs (all labs ordered are listed, but only abnormal results are displayed) Labs Reviewed  COMPREHENSIVE METABOLIC PANEL - Abnormal; Notable for the following components:      Result Value   Sodium 134 (*)    Glucose, Bld 300 (*)    Albumin 3.3 (*)    AST 13 (*)    All other components within normal limits  CBG MONITORING, ED - Abnormal; Notable for the following components:   Glucose-Capillary 287 (*)    All other components within normal limits  CBG MONITORING, ED - Abnormal; Notable for the following components:   Glucose-Capillary 243 (*)    All other components within normal limits  CBC WITH DIFFERENTIAL/PLATELET  PROTIME-INR     EKG EKG Interpretation  Date/Time:  Wednesday December 30 2020 17:56:40 EDT Ventricular Rate:  95 PR Interval:  137 QRS Duration: 99 QT Interval:  351 QTC Calculation: 442 R Axis:   50 Text Interpretation: Sinus rhythm RSR' in V1 or V2, T wave invesrions in aterolateral leads, seen in previous Confirmed by Kommor, Madison ([id number]) on 12/30/2020 6:12:39 PM  Radiology CT Lumbar Spine Wo Contrast  Result Date: 12/30/2020 CLINICAL DATA:  Low back pain radiating to both lower extremities EXAM: CT LUMBAR SPINE WITHOUT CONTRAST TECHNIQUE: Multidetector CT imaging of the lumbar spine was performed without intravenous contrast administration. Multiplanar CT image reconstructions were also generated. COMPARISON:  None. FINDINGS: Segmentation: 5 lumbar type vertebrae. Alignment: Normal. Vertebrae: No acute fracture or focal pathologic process. Paraspinal and other soft tissues: Calcific aortic atherosclerosis. Bilateral common iliac stents. Disc levels: There is no spinal canal stenosis or nerve root impingement. IMPRESSION: 1. No acute fracture or static subluxation of the lumbar spine. Aortic Atherosclerosis (ICD10-I70.0). Electronically Signed   By: Deatra Robinson M.D.   On: 12/30/2020 19:07   DG Foot Complete Right  Result Date: 12/30/2020 CLINICAL DATA:  Right foot numbness, first and fifth digit pain EXAM: RIGHT FOOT COMPLETE - 3+ VIEW COMPARISON:  None. FINDINGS: Frontal, oblique,  and lateral views of the right foot are obtained. Prior healed right first and second proximal phalangeal fractures. No acute bony abnormalities. Mild joint space narrowing of the first metatarsophalangeal and interphalangeal joints. Soft tissues are unremarkable. IMPRESSION: 1. Mild osteoarthritis of the first digit. 2. No acute displaced fracture. Electronically Signed   By: Sharlet Salina M.D.   On: 12/30/2020 18:38    Procedures Procedures   Medications Ordered in ED Medications  HYDROmorphone (DILAUDID)  injection 0.5 mg (0.5 mg Intramuscular Given 12/30/20 2044)    ED Course  I have reviewed the triage vital signs and the nursing notes.  Pertinent labs & imaging results that were available during my care of the patient were reviewed by me and considered in my medical decision making (see chart for details).    MDM Rules/Calculators/A&P                          Initial impression-patient presents with back pain.  He is alert, does not appear in acute stress, vital signs notable for tachycardia of 110.  Due to multiple falls concern for possible intracranial head bleed will obtain CT imaging for further evaluation, also scan patient's lower back pain basic lab work-up for further evaluation.  Work-up-CBC is unremarkable, CMP shows sodium of 134, glucose of 300, INR prothrombin time unremarkable, EKG of right foot negative for acute findings, CT lumbar spine negative for acute findings.  EKG sinus without signs of ischemia.  CT head negative for acute findings.   Reassessment-patient was reassessed after pain medication states he is feeling better, has no complaints this time.  Patient  was able to ambulate around the room.  Patient agreed for discharge at this time.  Rule out-low suspicion for intracranial head bleed and or mass as CT head is negative for acute findings.  Low suspicion for CVA as he has no focal deficits present on exam.  Low suspicion for DKA or HHS as he has no decrease in CO2, no anion gap presents.  I have low suspicion for spinal fracture or spinal cord abnormality as patient denies urinary incontinency, retention, difficulty with bowel movements, denies saddle paresthesias.  Spine was palpated there is no step-off, crepitus or gross deformities felt, patient had , full range of motion, neurovascular fully intact in the lower extremities.  Imaging negative for fractures or dislocation, patient is able ambulate. Low suspicion for septic arthritis as patient denies IV drug use,  skin exam was performed no erythematous, edema or warm joints noted.   Plan-  Back pain-likely acute on chronic will provide patient with Flexeril and narcotics, prednisone will be deferred as patient appears to be poorly controlled diabetic.  follow-up with PCP and neurosurgery for further evaluation.  Patient does have a slight gait disturbance likely secondary due to neuropathy and chronic back pain will recommend PT OT for gait training.  Vital signs have remained stable, no indication for hospital admission.  Patient given at home care as well strict return precautions.  Patient verbalized that they understood agreed to said plan.  Final Clinical Impression(s) / ED Diagnoses Final diagnoses:  Acute bilateral low back pain with right-sided sciatica    Rx / DC Orders ED Discharge Orders          Ordered    oxyCODONE-acetaminophen (PERCOCET/ROXICET) 5-325 MG tablet  Every 8 hours PRN        12/30/20 2155    cyclobenzaprine (FLEXERIL) 10 MG tablet  2  times daily PRN        12/30/20 2155    Ambulatory referral to Home Health       Comments: Please evaluate Nguyen Todorov for admission to [facility].  Disciplines requested: Physical Therapy and Occupational Therapy  Services to provide: Evaluate  Physician to follow patient's care (the person listed here will be responsible for signing ongoing orders): PCP  Requested Start of Care Date: Within 2-3 days  I certify that this patient is under my care and that I, or a Nurse Practitioner or Physician's Assistant working with me, had a face-to-face encounter that meets the physician face-to-face requirements with patient on 12/30/20 The encounter with the patient was in whole, or in part for the following medical condition(s) which is the primary reason for home health care (List medical condition).  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   12/30/20 2155             Carroll Sage, PA-C 12/30/20 2157    Glendora Score, MD 12/31/20 0000

## 2020-12-30 NOTE — Discharge Instructions (Addendum)
You have been seen here for back pain. I recommend taking over-the-counter pain medications like ibuprofen and/or Tylenol every 6 as needed.  Please follow dosage and on the back of bottle.  I also recommend applying heat to the area and stretching out the muscles as this will help decrease stiffness and pain.  I have given you information on exercises please follow. I have given you a short course of narcotics please take as prescribed.  This medication can make you drowsy do not consume alcohol or operate heavy machinery when taking this medication.  This medication is Tylenol in it do not take Tylenol and take this medication.  I have also given you a prescription for a muscle relaxer this can make you drowsy do not consume alcohol or operate heavy machinery when taking this medication.    Please follow-up with your PCP and your neurosurgeon for further evaluation.  Come back to the emergency department if you develop chest pain, shortness of breath, severe abdominal pain, uncontrolled nausea, vomiting, diarrhea.

## 2020-12-30 NOTE — ED Notes (Signed)
Checked CBG 243, RN Ranette informed

## 2020-12-31 ENCOUNTER — Other Ambulatory Visit: Payer: Self-pay

## 2020-12-31 ENCOUNTER — Emergency Department (HOSPITAL_COMMUNITY)
Admission: EM | Admit: 2020-12-31 | Discharge: 2020-12-31 | Disposition: A | Discharge status: Home / Self Care | Payer: Medicaid Other | Attending: Emergency Medicine | Admitting: Emergency Medicine

## 2020-12-31 DIAGNOSIS — Z5321 Procedure and treatment not carried out due to patient leaving prior to being seen by health care provider: Secondary | ICD-10-CM | POA: Insufficient documentation

## 2020-12-31 DIAGNOSIS — M79605 Pain in left leg: Secondary | ICD-10-CM | POA: Diagnosis not present

## 2020-12-31 DIAGNOSIS — M545 Low back pain, unspecified: Secondary | ICD-10-CM | POA: Diagnosis present

## 2020-12-31 DIAGNOSIS — M79604 Pain in right leg: Secondary | ICD-10-CM | POA: Insufficient documentation

## 2020-12-31 LAB — CBC WITH DIFFERENTIAL/PLATELET
Abs Immature Granulocytes: 0.06 10*3/uL (ref 0.00–0.07)
Basophils Absolute: 0.1 10*3/uL (ref 0.0–0.1)
Basophils Relative: 1 %
Eosinophils Absolute: 0.2 10*3/uL (ref 0.0–0.5)
Eosinophils Relative: 2 %
HCT: 43.6 % (ref 39.0–52.0)
Hemoglobin: 14.5 g/dL (ref 13.0–17.0)
Immature Granulocytes: 1 %
Lymphocytes Relative: 30 %
Lymphs Abs: 2.4 10*3/uL (ref 0.7–4.0)
MCH: 29.2 pg (ref 26.0–34.0)
MCHC: 33.3 g/dL (ref 30.0–36.0)
MCV: 87.9 fL (ref 80.0–100.0)
Monocytes Absolute: 0.5 10*3/uL (ref 0.1–1.0)
Monocytes Relative: 7 %
Neutro Abs: 4.8 10*3/uL (ref 1.7–7.7)
Neutrophils Relative %: 59 %
Platelets: 338 10*3/uL (ref 150–400)
RBC: 4.96 MIL/uL (ref 4.22–5.81)
RDW: 12.6 % (ref 11.5–15.5)
WBC: 8 10*3/uL (ref 4.0–10.5)
nRBC: 0 % (ref 0.0–0.2)

## 2020-12-31 LAB — BASIC METABOLIC PANEL
Anion gap: 11 (ref 5–15)
BUN: 19 mg/dL (ref 6–20)
CO2: 21 mmol/L — ABNORMAL LOW (ref 22–32)
Calcium: 9.2 mg/dL (ref 8.9–10.3)
Chloride: 100 mmol/L (ref 98–111)
Creatinine, Ser: 1.3 mg/dL — ABNORMAL HIGH (ref 0.61–1.24)
GFR, Estimated: >60 mL/min (ref >60)
Glucose, Bld: 436 mg/dL — ABNORMAL HIGH (ref 70–99)
Potassium: 4.3 mmol/L (ref 3.5–5.1)
Sodium: 132 mmol/L — ABNORMAL LOW (ref 135–145)

## 2020-12-31 NOTE — ED Notes (Signed)
X2 with no response.  

## 2020-12-31 NOTE — ED Notes (Signed)
Patient called x1 with no response 

## 2020-12-31 NOTE — ED Provider Notes (Addendum)
Emergency Medicine Provider Triage Evaluation Note  Dylan Anderson , a 60 y.o. male  was evaluated in triage.  Pt complains of bilateral leg pain back pain that has been chronic for some time but worsening over the last few days.  He was seen and evaluated at [facility] yesterday and had CT of his lumbar spine which was negative for fracture.  Significant other notes that he has been having discoloration of his toes over the last week which are painful.  Also complains of numbness to the feet.  Does have a history of diabetes.  He denies any fever or chills.  Does have multiple femoral stents with the most recent ones placed this year.   Review of Systems  Positive:  Negative: See above   Physical Exam  BP 105/76 (BP Location: Right Arm)   Pulse 83   Temp 98 F (36.7 C) (Oral)   Resp 18   SpO2 100%  Gen:   Awake, no distress   Resp:  Normal effort  MSK:   Moves extremities without difficulty  Other:  Right great toe has evidence of ecchymosis.  Right fifth toe is blue on the plantar aspect.  Posterior tibialis pulse was felt with Doppler on the right.  Medical Decision Making  Medically screening exam initiated at 7:27 PM.  Appropriate orders placed.  Dylan Anderson was informed that the remainder of the evaluation will be completed by another provider, this initial triage assessment does not replace that evaluation, and the importance of remaining in the ED until their evaluation is complete.     Teressa Lower, PA-C 12/31/20 1930    Teressa Lower, PA-C 12/31/20 1931    Benjiman Core, MD 12/31/20 2226

## 2020-12-31 NOTE — ED Triage Notes (Signed)
Patient here for evaluation of lower back with radiation to lower extremities. Seen at [facility] yesterday for same, had x-ray and CT and discharged with dx of sciatica. Patient here for re-evaluation and requesting explanation of his problem because he does not think they determined the cause of his problem last night. Patient also reports the opioid prescription that was prescribed yesterday [facility] which told him they do not carry opioids.

## 2021-01-01 ENCOUNTER — Telehealth (HOSPITAL_BASED_OUTPATIENT_CLINIC_OR_DEPARTMENT_OTHER): Payer: Self-pay | Admitting: Physician Assistant

## 2021-01-01 ENCOUNTER — Other Ambulatory Visit: Payer: Self-pay

## 2021-01-01 MED ORDER — OXYCODONE-ACETAMINOPHEN 5-325 MG PO TABS: 1.0000 | ORAL_TABLET | Freq: Four times a day (QID) | ORAL | 0 refills | Status: DC | PRN

## 2021-01-01 NOTE — Telephone Encounter (Signed)
Medication changed to [facility].  Verified with [facility] not filled

## 2021-01-04 ENCOUNTER — Ambulatory Visit: Payer: Self-pay | Admitting: Family Medicine

## 2021-01-14 ENCOUNTER — Other Ambulatory Visit: Payer: Self-pay

## 2021-01-14 ENCOUNTER — Other Ambulatory Visit (HOSPITAL_COMMUNITY): Payer: Self-pay | Admitting: Nurse Practitioner

## 2021-01-14 ENCOUNTER — Ambulatory Visit (HOSPITAL_COMMUNITY)
Admission: RE | Admit: 2021-01-14 | Discharge: 2021-01-14 | Disposition: A | Discharge status: Home / Self Care | Payer: Medicaid Other | Source: Ambulatory Visit | Attending: Nurse Practitioner | Admitting: Nurse Practitioner

## 2021-01-14 ENCOUNTER — Telehealth: Payer: Self-pay

## 2021-01-14 DIAGNOSIS — M79661 Pain in right lower leg: Secondary | ICD-10-CM

## 2021-01-14 DIAGNOSIS — I779 Disorder of arteries and arterioles, unspecified: Secondary | ICD-10-CM

## 2021-01-14 DIAGNOSIS — M7989 Other specified soft tissue disorders: Secondary | ICD-10-CM

## 2021-01-14 NOTE — Telephone Encounter (Signed)
Spoke to patient. He is c/o rest pain and an open wound on his right little toe. Says he just noticed it today. He is s/p bilateral iliac artery stenting in March. He had ABIs performed today - added him to Tuesday schedule with bilateral aortoiliac duplex.

## 2021-01-14 NOTE — Telephone Encounter (Signed)
Received email communication about patient from Dr. Cresenciano Genre that patient has rest pain and critcal ABI values. Patient was scheduled for s/p aortagram follow up from March but did not make appt. Left VM with patient to call and schedule ASAP appt - will try for [facility] day and bil aorto iliac duplex.

## 2021-01-14 NOTE — Progress Notes (Signed)
Critical ABI results given to Dr. Robyne Peers at 2:45 pm.   01/14/2021  2:59 PM Dylan Anderson, Dylan Anderson

## 2021-01-17 ENCOUNTER — Other Ambulatory Visit: Payer: Self-pay | Admitting: Family

## 2021-01-17 DIAGNOSIS — E1142 Type 2 diabetes mellitus with diabetic polyneuropathy: Secondary | ICD-10-CM

## 2021-01-17 MED FILL — Metformin HCl Tab 1000 MG: ORAL | 30 days supply | Qty: 60 | Fill #0 | Status: AC

## 2021-01-17 MED FILL — Nitroglycerin SL Tab 0.4 MG: SUBLINGUAL | 30 days supply | Qty: 25 | Fill #0 | Status: AC

## 2021-01-17 MED FILL — Insulin Pen Needle 31 G X 8 MM (1/3" or 5/16"): 30 days supply | Qty: 100 | Fill #0 | Status: AC

## 2021-01-17 MED FILL — Metoprolol Succinate Tab ER 24HR 50 MG (Tartrate Equiv): ORAL | 60 days supply | Qty: 60 | Fill #0 | Status: AC

## 2021-01-17 MED FILL — Atorvastatin Calcium Tab 80 MG (Base Equivalent): ORAL | 30 days supply | Qty: 30 | Fill #0 | Status: AC

## 2021-01-17 MED FILL — Pantoprazole Sodium EC Tab 40 MG (Base Equiv): ORAL | 30 days supply | Qty: 30 | Fill #0 | Status: AC

## 2021-01-17 MED FILL — Amlodipine Besylate Tab 5 MG (Base Equivalent): ORAL | 30 days supply | Qty: 30 | Fill #0 | Status: AC

## 2021-01-17 MED FILL — Insulin Glargine Soln Pen-Injector 100 Unit/ML: SUBCUTANEOUS | 4 days supply | Qty: 3 | Fill #0 | Status: CN

## 2021-01-17 MED FILL — Insulin Aspart Soln Pen-injector 100 Unit/ML: SUBCUTANEOUS | 30 days supply | Qty: 6 | Fill #0 | Status: AC

## 2021-01-18 ENCOUNTER — Other Ambulatory Visit: Payer: Self-pay

## 2021-01-18 ENCOUNTER — Other Ambulatory Visit: Payer: Self-pay | Admitting: Family Medicine

## 2021-01-18 NOTE — Telephone Encounter (Signed)
   Notes to clinic please assess.

## 2021-01-19 ENCOUNTER — Telehealth: Payer: Self-pay | Admitting: Cardiology

## 2021-01-19 ENCOUNTER — Ambulatory Visit (HOSPITAL_COMMUNITY)
Admission: RE | Admit: 2021-01-19 | Discharge: 2021-01-19 | Disposition: A | Discharge status: Home / Self Care | Payer: Medicaid Other | Source: Ambulatory Visit | Attending: Vascular Surgery | Admitting: Vascular Surgery

## 2021-01-19 ENCOUNTER — Other Ambulatory Visit: Payer: Self-pay

## 2021-01-19 ENCOUNTER — Other Ambulatory Visit: Payer: Self-pay | Admitting: Nurse Practitioner

## 2021-01-19 ENCOUNTER — Ambulatory Visit (INDEPENDENT_AMBULATORY_CARE_PROVIDER_SITE_OTHER): Payer: Medicaid Other | Admitting: Physician Assistant

## 2021-01-19 VITALS — BP 138/79 | HR 87 | Temp 98.1°F | Ht 69.0 in | Wt 136.9 lb

## 2021-01-19 DIAGNOSIS — I779 Disorder of arteries and arterioles, unspecified: Secondary | ICD-10-CM | POA: Diagnosis not present

## 2021-01-19 DIAGNOSIS — M79604 Pain in right leg: Secondary | ICD-10-CM

## 2021-01-19 DIAGNOSIS — L97519 Non-pressure chronic ulcer of other part of right foot with unspecified severity: Secondary | ICD-10-CM

## 2021-01-19 DIAGNOSIS — I739 Peripheral vascular disease, unspecified: Secondary | ICD-10-CM

## 2021-01-19 DIAGNOSIS — N529 Male erectile dysfunction, unspecified: Secondary | ICD-10-CM

## 2021-01-19 NOTE — Progress Notes (Signed)
Office Note     CC:  follow up Requesting Provider:  Leary Roca*  HPI: Dylan Anderson is a 60 y.o. (12-20-1960) male who presents with right buttock and thigh claudication and discoloration to the tips of his right first and fifth toes.  He was seen by his primary care provider yesterday who referred him back to our office.  The patient was initially seen in 2019 by Dr. Chestine Spore for PAD.  He had palpable pedal pulses at that time.  He was followed longitudinally in our office.  In March of this year he presented with critical limb ischemia of the left lower extremity and underwent aortogram with bilateral runoff.  Bilateral iliac artery stents were placed by Dr. Chestine Spore and the patient was prescribed Plavix, aspirin and statin.  His vascular history is also significant for left common iliac artery stent placed approximately 8 years ago at [facility].  His wife accompanies him today.  There are reports of his symptoms vary in timing.  She states she noticed his right leg pain and weakness approximately 4 to 5 months ago and the patient reports right lower extremity pain 2 months ago.  He is unable to walk further than approximately 200 feet without resting.  He states he has been compliant with his statin, aspirin and Plavix.  He recalls that he did not keep his follow-up appointment after his aortogram and this is his first follow-up evaluation.  He continues to smoke.  His past medical history significant for diabetes mellitus, hypertension and mild coronary artery disease.  He has had a previous cardiac catheterization by Dr. Dulce Sellar and medical management was recommended.  He denies current or recent chest pain or syncope.   Past Medical History:  Diagnosis Date   Acute gout involving toe of left foot 09/21/2017   Arthritis    Back pain    Chest pain in adult 05/26/2018   Chronic cough 07/13/2017   Claudication (HCC)    Diabetes mellitus type II, non insulin dependent (HCC) 07/10/2012    Erectile dysfunction    Gastroesophageal reflux disease 04/27/2017   Hand pain 03/18/2017   Healthcare maintenance 07/12/2017   Hip pain 05/20/2017   Hyperlipidemia 01/25/2017   Hypertension 07/10/2012   Insomnia 02/06/2017   Low back pain 01/25/2017   Peripheral artery disease (HCC) 06/07/2017   PVD (peripheral vascular disease) (HCC)    Tobacco abuse 07/10/2012   Type 2 diabetes mellitus (HCC)     Past Surgical History:  Procedure Laterality Date   ABDOMINAL AORTOGRAM W/LOWER EXTREMITY N/A 06/18/2020   Procedure: ABDOMINAL AORTOGRAM W/LOWER EXTREMITY;  Surgeon: Cephus Shelling, MD;  Location: [facility];  Service: Cardiovascular;  Laterality: N/A;   ILIAC ARTERY STENT     LEFT HEART CATH AND CORONARY ANGIOGRAPHY N/A 06/19/2018   Procedure: LEFT HEART CATH AND CORONARY ANGIOGRAPHY;  Surgeon: Lennette Bihari, MD;  Location: [facility];  Service: Cardiovascular;  Laterality: N/A;   PERIPHERAL VASCULAR INTERVENTION Bilateral 06/18/2020   Procedure: PERIPHERAL VASCULAR INTERVENTION;  Surgeon: Cephus Shelling, MD;  Location: [facility];  Service: Cardiovascular;  Laterality: Bilateral;  common Iliac   stent Left    left common and left internal illiac stent    Social History   Socioeconomic History   Marital status: Legally Separated    Spouse name: Not on file   Number of children: Not on file   Years of education: Not on file   Highest education level:  Not on file  Occupational History   Occupation: unemployed  Tobacco Use   Smoking status: Every Day    Packs/day: 0.50    Years: 17.00    Pack years: 8.50    Types: Cigarettes    Start date: 04/04/1978   Smokeless tobacco: Never   Tobacco comments:    Max 1 ppd  Vaping Use   Vaping Use: Never used  Substance and Sexual Activity   Alcohol use: Yes    Comment: occ   Drug use: No   Sexual activity: Yes    Partners: Female  Other Topics Concern   Not on file  Social History Narrative   Not on file    Social Determinants of Health   Financial Resource Strain: Not on file  Food Insecurity: Not on file  Transportation Needs: Not on file  Physical Activity: Not on file  Stress: Not on file  Social Connections: Not on file  Intimate Partner Violence: Not on file   Family History  Problem Relation Age of Onset   Heart failure Mother    Osteoarthritis Mother    COPD Sister    Stroke Sister    Heart attack Maternal Aunt    Heart attack Maternal Uncle     Current Outpatient Medications  Medication Sig Dispense Refill   amLODipine (NORVASC) 5 MG tablet TAKE 1 TABLET (5 MG TOTAL) BY MOUTH DAILY. 30 tablet 3   aspirin 81 MG EC tablet TAKE 1 TABLET (81 MG TOTAL) BY MOUTH DAILY. 120 tablet 0   aspirin EC 81 MG tablet Take 1 tablet (81 mg total) by mouth daily. 120 tablet 0   atorvastatin (LIPITOR) 80 MG tablet TAKE 1 TABLET (80 MG TOTAL) BY MOUTH DAILY AT 6 PM. 90 tablet 0   clopidogrel (PLAVIX) 75 MG tablet Take 1 tablet (75 mg total) by mouth daily. 30 tablet 11   cyclobenzaprine (FLEXERIL) 10 MG tablet Take 1 tablet (10 mg total) by mouth 2 (two) times daily as needed for muscle spasms. 20 tablet 0   fenofibrate (TRICOR) 48 MG tablet Take 1 tablet (48 mg total) by mouth daily. 90 tablet 0   glucose blood (TRUE METRIX BLOOD GLUCOSE TEST) test strip Use as instructed. Check blood glucose level by fingerstick three times per day. E11.65 200 each 6   insulin aspart (NOVOLOG) 100 UNIT/ML FlexPen INJECT 10 UNITS INTO THE SKIN 2 (TWO) TIMES DAILY WITH A MEAL. 6 mL 6   Insulin Glargine (BASAGLAR KWIKPEN) 100 UNIT/ML INJECT 70 UNITS INTO THE SKIN DAILY 15 mL 2   insulin glargine (LANTUS) 100 UNIT/ML Solostar Pen Inject 70 Units into the skin daily. Inject 38 units in skin morning and inject 38 units in skin at bedtime. 15 mL 2   Insulin Pen Needle 31G X 8 MM MISC USE AS DIRECTED WITH INSULIN 100 each 2   liraglutide (VICTOZA) 18 MG/3ML SOPN START 0.6MG  INTO THE SKIN DAILY FOR 7 DAYS,THEN 1.2MG   DAILY X7 DAYS, THEN 1.8MG  DAILY THEREAFTER. 6 mL 1   metFORMIN (GLUCOPHAGE) 1000 MG tablet TAKE 1 TABLET (1,000 MG TOTAL) BY MOUTH 2 (TWO) TIMES DAILY WITH A MEAL. 180 tablet 0   metoprolol succinate (TOPROL-XL) 50 MG 24 hr tablet TAKE 1 TABLET (50 MG TOTAL) BY MOUTH AT BEDTIME. 30 tablet 2   nicotine (NICODERM CQ - DOSED IN MG/24 HOURS) 14 mg/24hr patch PLACE 1 PATCH (14 MG TOTAL) ONTO THE SKIN DAILY. 28 patch 0   nitroGLYCERIN (NITROSTAT) 0.4 MG SL tablet  PLACE 1 TABLET (0.4 MG TOTAL) UNDER THE TONGUE EVERY 5 (FIVE) MINUTES AS NEEDED FOR CHEST PAIN. 90 tablet 3   oxyCODONE-acetaminophen (PERCOCET/ROXICET) 5-325 MG tablet Take 1 tablet by mouth every 6 (six) hours as needed for severe pain. 15 tablet 0   pantoprazole (PROTONIX) 40 MG tablet TAKE 1 TABLET (40 MG TOTAL) BY MOUTH DAILY. 90 tablet 0   tamsulosin (FLOMAX) 0.4 MG CAPS capsule TAKE 1 CAPSULE (0.4 MG TOTAL) BY MOUTH DAILY. 90 capsule 0   TRUEplus Lancets 28G MISC Use as instructed. Check blood glucose level by fingerstick 3 times per day. E11.65 200 each 6   buPROPion (WELLBUTRIN SR) 200 MG 12 hr tablet TAKE 1 TABLET (200 MG TOTAL) BY MOUTH 2 (TWO) TIMES DAILY. 180 tablet 0   Evolocumab (REPATHA SURECLICK) 140 MG/ML SOAJ Inject 140 mg into the skin every 14 (fourteen) days. (Patient not taking: Reported on 01/19/2021) 2 pen 0   pregabalin (LYRICA) 100 MG capsule TAKE 1 CAPSULE (100 MG TOTAL) BY MOUTH 3 (THREE) TIMES DAILY. 90 capsule 2   pregabalin (LYRICA) 75 MG capsule TAKE 1 CAPSULE (75 MG TOTAL) BY MOUTH 3 (THREE) TIMES DAILY. 270 capsule 0   No current facility-administered medications for this visit.    No Known Allergies   REVIEW OF SYSTEMS:   [X]  denotes positive finding, [ ]  denotes negative finding Cardiac  Comments:  Chest pain or chest pressure:    Shortness of breath upon exertion: x   Short of breath when lying flat:    Irregular heart rhythm:        Vascular    Pain in calf, thigh, or hip brought on by  ambulation: x   Pain in feet at night that wakes you up from your sleep:  x   Blood clot in your veins:    Leg swelling:         Pulmonary    Oxygen at home:    Productive cough:     Wheezing:         Neurologic    Sudden weakness in arms or legs:     Sudden numbness in arms or legs:     Sudden onset of difficulty speaking or slurred speech:    Temporary loss of vision in one eye:     Problems with dizziness:         Gastrointestinal    Blood in stool:     Vomited blood:         Genitourinary    Burning when urinating:     Blood in urine:        Psychiatric    Major depression:         Hematologic    Bleeding problems:    Problems with blood clotting too easily:        Skin    Rashes or ulcers:        Constitutional    Fever or chills:      PHYSICAL EXAMINATION:  Vitals:   01/19/21 0840  BP: 138/79  Pulse: 87  Temp: 98.1 F (36.7 C)  TempSrc: Skin  SpO2: 98%  Weight: 136 lb 14.4 oz (62.1 kg)  Height: 5\' 9"  (1.753 m)    General:  WDWN in NAD; vital signs documented above Gait: Not observed HENT: WNL, normocephalic Pulmonary: normal non-labored breathing , without Rales, rhonchi,  wheezing Cardiac: regular HR, without  Murmurs without carotid bruit Abdomen: soft, NT, no masses Skin: without rashes Vascular Exam/Pulses: No palpable  pedal or femoral artery pulses bilaterally.  2+ radial and brachial artery pulses bilaterally. + Doppler signals of right DP, PT and peroneal arteries. + Doppler signals of left AT and peroneal arteries.  Extremities: with ischemic changes, without Gangrene , without cellulitis; without open wounds; purple discoloration to the tip of his right great toe and pad of right fifth toe.  Decreased motor function of right toes and decreased touch sensation.  Intact motor function and sensation of left foot. Musculoskeletal: no muscle wasting or atrophy  Neurologic: A&O X 3;  No focal weakness or paresthesias are detected Psychiatric:   The pt has Normal affect.       Non-Invasive Vascular Imaging:   01/19/2021 +-------+-----------+-----------+------------+------------+  ABI/TBIToday's ABIToday's TBIPrevious ABIPrevious TBI  +-------+-----------+-----------+------------+------------+  Right  0.23       Absent     1.15        0.99          +-------+-----------+-----------+------------+------------+  Left   0.42       Absent     0.51        0.20          +-------+-----------+-----------+------------+------------+  Bilateral ABIs appear decreased. The right ABI has significantly changes  from normal resting flow to critical ischemia. Bilateral TBIs appear  decreased and are now absent.     Summary:  Right: Resting right ankle-brachial index indicates critical limb ischemia  with a significant change since previous exam earlier this year. The right  toe-brachial index is absent.   Left: Resting left ankle-brachial index indicates severe left lower  extremity arterial disease. The left toe-brachial index is absent.   *See table(s) above for measurements and observations.   Vascular consult recommended.  Electronically signed by Gerarda Fraction on 01/15/2021 at 8:23:07 AM.    Final    Aorto iliac duplex: Occluded bilateral common iliac artery stents.  ASSESSMENT/PLAN:: 60 y.o. male here for follow up for RLE claudication and tissue loss. Imaging, ABIs and physical exam c/w CLI of RLE, occluded bilateral CIA stents placed in March 2022 with history of previously place left CIA stent at outside facility. I discussed the case with Dr. Chestine Spore who examined the patient and discussed findings with the patient and his wife.  Patient will need CTA of the abdomen and pelvis with runoff.  Explained he has had now failed stents x 2 in the left common iliac artery and evidence today of failure of both right and left common iliac artery stents with tissue loss.  Again, discussed in length the role tobacco use,  diabetes, other factors contribute to atherosclerosis and present situation.  The patient has a history of coronary artery disease and has recently been in touch with his cardiologist office regarding 5-month follow-up.  We will make arrangements for cardiac clearance with Dr. Dulce Sellar in anticipation of possible aortobifemoral bypass. He will follow-up with Dr. Chestine Spore after CTA.  Milinda Antis, PA-C [facility] [phone]  Clinic MD:   Dr. Chestine Spore

## 2021-01-19 NOTE — Telephone Encounter (Signed)
I s/w the pt and he is agreeable to plan of care for a pre op appt. Pt has been scheduled to see Dr. Dulce Sellar 02/02/21 @ 1 pm per Burnett Kanaris RN for Dr. Dulce Sellar. Pt thanked me for the call. I will forward notes to MD for upcoming appt. Will send FYI to surgeon's office pt has appt 02/02/21 for pre op assessment.

## 2021-01-19 NOTE — Progress Notes (Deleted)
Peripheral Arterial Disease Follow-Up   VASCULAR SURGERY ASSESSMENT & PLAN:   Dylan Anderson is a 60 y.o. male  Stable peripheral arterial disease.  No significant change in ABIs as compared to 1 year ago.  Patient is without claudication or rest pain. Continue optimal medical management of diabetes, hypertension and follow-up with primary care physician. Encouraged complete smoking cessation. Continue the following medications: Follow-up in ***  SUBJECTIVE:   The patient denies lower extremity pain with exercise or rest pain.  Denies skin loss or ulceration.  PHYSICAL EXAM:   Vitals:   01/19/21 0840  BP: 138/79  Pulse: 87  Temp: 98.1 F (36.7 C)  TempSrc: Skin  SpO2: 98%  Weight: 136 lb 14.4 oz (62.1 kg)  Height: 5\' 9"  (1.753 m)    General appearance: Well-developed, well-nourished in no apparent distress Neurologic: Alert and oriented x 4. Cardiovascular: Heart rate and rhythm are regular.   Abdomen: No palpable pulsatile mass. Extremities: Skin intact.  Both feet are warm and well perfused.  Motor function and sensation intact Pulse exam: 2+ femoral, dorsalis pedis, posterior tibial pulses bilaterally   NON-INVASIVE VASCULAR STUDIES        PROBLEM LIST:    The patient's past medical history, past surgical history, family history, social history, allergy list and medication list are reviewed.   CURRENT MEDS:    Current Outpatient Medications:    amLODipine (NORVASC) 5 MG tablet, TAKE 1 TABLET (5 MG TOTAL) BY MOUTH DAILY., Disp: 30 tablet, Rfl: 3   aspirin 81 MG EC tablet, TAKE 1 TABLET (81 MG TOTAL) BY MOUTH DAILY., Disp: 120 tablet, Rfl: 0   aspirin EC 81 MG tablet, Take 1 tablet (81 mg total) by mouth daily., Disp: 120 tablet, Rfl: 0   atorvastatin (LIPITOR) 80 MG tablet, TAKE 1 TABLET (80 MG TOTAL) BY MOUTH DAILY AT 6 PM., Disp: 90 tablet, Rfl: 0   clopidogrel (PLAVIX) 75 MG tablet, Take 1 tablet (75 mg total) by mouth daily., Disp: 30 tablet, Rfl: 11    cyclobenzaprine (FLEXERIL) 10 MG tablet, Take 1 tablet (10 mg total) by mouth 2 (two) times daily as needed for muscle spasms., Disp: 20 tablet, Rfl: 0   fenofibrate (TRICOR) 48 MG tablet, Take 1 tablet (48 mg total) by mouth daily., Disp: 90 tablet, Rfl: 0   glucose blood (TRUE METRIX BLOOD GLUCOSE TEST) test strip, Use as instructed. Check blood glucose level by fingerstick three times per day. E11.65, Disp: 200 each, Rfl: 6   insulin aspart (NOVOLOG) 100 UNIT/ML FlexPen, INJECT 10 UNITS INTO THE SKIN 2 (TWO) TIMES DAILY WITH A MEAL., Disp: 6 mL, Rfl: 6   Insulin Glargine (BASAGLAR KWIKPEN) 100 UNIT/ML, INJECT 70 UNITS INTO THE SKIN DAILY, Disp: 15 mL, Rfl: 2   insulin glargine (LANTUS) 100 UNIT/ML Solostar Pen, Inject 70 Units into the skin daily. Inject 38 units in skin morning and inject 38 units in skin at bedtime., Disp: 15 mL, Rfl: 2   Insulin Pen Needle 31G X 8 MM MISC, USE AS DIRECTED WITH INSULIN, Disp: 100 each, Rfl: 2   liraglutide (VICTOZA) 18 MG/3ML SOPN, START 0.6MG  INTO THE SKIN DAILY FOR 7 DAYS,THEN 1.2MG  DAILY X7 DAYS, THEN 1.8MG  DAILY THEREAFTER., Disp: 6 mL, Rfl: 1   metFORMIN (GLUCOPHAGE) 1000 MG tablet, TAKE 1 TABLET (1,000 MG TOTAL) BY MOUTH 2 (TWO) TIMES DAILY WITH A MEAL., Disp: 180 tablet, Rfl: 0   metoprolol succinate (TOPROL-XL) 50 MG 24 hr tablet, TAKE 1 TABLET (50 MG TOTAL) BY  MOUTH AT BEDTIME., Disp: 30 tablet, Rfl: 2   nicotine (NICODERM CQ - DOSED IN MG/24 HOURS) 14 mg/24hr patch, PLACE 1 PATCH (14 MG TOTAL) ONTO THE SKIN DAILY., Disp: 28 patch, Rfl: 0   nitroGLYCERIN (NITROSTAT) 0.4 MG SL tablet, PLACE 1 TABLET (0.4 MG TOTAL) UNDER THE TONGUE EVERY 5 (FIVE) MINUTES AS NEEDED FOR CHEST PAIN., Disp: 90 tablet, Rfl: 3   oxyCODONE-acetaminophen (PERCOCET/ROXICET) 5-325 MG tablet, Take 1 tablet by mouth every 6 (six) hours as needed for severe pain., Disp: 15 tablet, Rfl: 0   pantoprazole (PROTONIX) 40 MG tablet, TAKE 1 TABLET (40 MG TOTAL) BY MOUTH DAILY., Disp: 90 tablet,  Rfl: 0   tamsulosin (FLOMAX) 0.4 MG CAPS capsule, TAKE 1 CAPSULE (0.4 MG TOTAL) BY MOUTH DAILY., Disp: 90 capsule, Rfl: 0   TRUEplus Lancets 28G MISC, Use as instructed. Check blood glucose level by fingerstick 3 times per day. E11.65, Disp: 200 each, Rfl: 6   buPROPion (WELLBUTRIN SR) 200 MG 12 hr tablet, TAKE 1 TABLET (200 MG TOTAL) BY MOUTH 2 (TWO) TIMES DAILY., Disp: 180 tablet, Rfl: 0   Evolocumab (REPATHA SURECLICK) 140 MG/ML SOAJ, Inject 140 mg into the skin every 14 (fourteen) days. (Patient not taking: Reported on 01/19/2021), Disp: 2 pen, Rfl: 0   pregabalin (LYRICA) 100 MG capsule, TAKE 1 CAPSULE (100 MG TOTAL) BY MOUTH 3 (THREE) TIMES DAILY., Disp: 90 capsule, Rfl: 2   pregabalin (LYRICA) 75 MG capsule, TAKE 1 CAPSULE (75 MG TOTAL) BY MOUTH 3 (THREE) TIMES DAILY., Disp: 270 capsule, Rfl: 0   REVIEW OF SYSTEMS:   [X]  denotes positive finding, [ ]  denotes negative finding Cardiac  Comments:  Chest pain or chest pressure:    Shortness of breath upon exertion:    Short of breath when lying flat:    Irregular heart rhythm:        Vascular    Pain in calf, thigh, or hip brought on by ambulation:    Pain in feet at night that wakes you up from your sleep:     Blood clot in your veins:    Leg swelling:         Pulmonary    Oxygen at home:    Productive cough:     Wheezing:         Neurologic    Sudden weakness in arms or legs:     Sudden numbness in arms or legs:     Sudden onset of difficulty speaking or slurred speech:    Temporary loss of vision in one eye:     Problems with dizziness:         Gastrointestinal    Blood in stool:     Vomited blood:         Genitourinary    Burning when urinating:     Blood in urine:        Psychiatric    Major depression:         Hematologic    Bleeding problems:    Problems with blood clotting too easily:        Skin    Rashes or ulcers:        Constitutional    Fever or chills:     , PA-C  Office:  [phone] 01/19/2021

## 2021-01-19 NOTE — Telephone Encounter (Signed)
   [facility] Pre-operative Risk Assessment    Request for surgical clearance:  What type of surgery is being performed?  AORTA BYPASS  When is this surgery scheduled? TBD  What type of clearance is required (medical clearance vs. Pharmacy clearance to hold med vs. Both)? MEDICAL  Are there any medications that need to be held prior to surgery and how long? TBD  Practice name and name of physician performing surgery? [facility]  What is your office phone number [phone]   7.   What is your office fax number [phone]  8.   Anesthesia type (None, local, MAC, general) ? PROBABLY GENERAL?   Larina Bras 01/19/2021, 9:49 AM  _________________________________________________________________   (provider comments below)

## 2021-01-19 NOTE — Telephone Encounter (Signed)
   Name: Dylan Anderson  DOB: 10/11/1960  MRN: [id number]  Primary Cardiologist: Norman Herrlich, MD  Chart reviewed as part of pre-operative protocol coverage. Because of [name]'s past medical history and time since last visit, he will require a follow-up visit in order to better assess preoperative cardiovascular risk.  Pre-op covering staff: - Please schedule appointment and call patient to inform them. If patient already had an upcoming appointment within acceptable timeframe, please add "pre-op clearance" to the appointment notes so provider is aware. - Please contact requesting surgeon's office via preferred method (i.e, phone, fax) to inform them of need for appointment prior to surgery.  If applicable, this message will also be routed to pharmacy pool and/or primary cardiologist for input on holding anticoagulant/antiplatelet agent as requested below so that this information is available to the clearing provider at time of patient's appointment.   [place], [place]  01/19/2021, 10:15 AM

## 2021-01-20 ENCOUNTER — Other Ambulatory Visit: Payer: Self-pay

## 2021-01-20 MED ORDER — TAMSULOSIN HCL 0.4 MG PO CAPS
ORAL_CAPSULE | Freq: Every day | ORAL | 0 refills | Status: AC
  Filled 2021-01-20: qty 30, 30d supply, fill #0

## 2021-01-22 ENCOUNTER — Other Ambulatory Visit: Payer: Self-pay

## 2021-01-22 DIAGNOSIS — I779 Disorder of arteries and arterioles, unspecified: Secondary | ICD-10-CM

## 2021-01-25 ENCOUNTER — Other Ambulatory Visit: Payer: Self-pay

## 2021-02-02 ENCOUNTER — Other Ambulatory Visit: Payer: Self-pay

## 2021-02-02 ENCOUNTER — Encounter: Payer: Self-pay | Admitting: Cardiology

## 2021-02-02 ENCOUNTER — Ambulatory Visit (INDEPENDENT_AMBULATORY_CARE_PROVIDER_SITE_OTHER): Payer: Medicaid Other | Admitting: Cardiology

## 2021-02-02 VITALS — BP 122/64 | HR 120 | Ht 69.0 in | Wt 135.0 lb

## 2021-02-02 DIAGNOSIS — E119 Type 2 diabetes mellitus without complications: Secondary | ICD-10-CM

## 2021-02-02 DIAGNOSIS — E782 Mixed hyperlipidemia: Secondary | ICD-10-CM

## 2021-02-02 DIAGNOSIS — I493 Ventricular premature depolarization: Secondary | ICD-10-CM | POA: Diagnosis not present

## 2021-02-02 DIAGNOSIS — I10 Essential (primary) hypertension: Secondary | ICD-10-CM

## 2021-02-02 DIAGNOSIS — I739 Peripheral vascular disease, unspecified: Secondary | ICD-10-CM

## 2021-02-02 DIAGNOSIS — I25118 Atherosclerotic heart disease of native coronary artery with other forms of angina pectoris: Secondary | ICD-10-CM | POA: Diagnosis not present

## 2021-02-02 NOTE — Progress Notes (Addendum)
Fortunately continues to have trouble with chronic urination Cardiology Office Note:    Date:  02/02/2021   ID:  Dylan Anderson, DOB Sep 20, 1960, MRN [id number]  PCP:  Nechama Guard, FNP  Cardiologist:  Norman Herrlich, MD    Referring MD: Nechama Guard, FNP    ASSESSMENT:    1. Coronary artery disease of native artery of native heart with stable angina pectoris (HCC)   2. PVC's (premature ventricular contractions)   3. Mixed hyperlipidemia   4. Essential hypertension   5. Diabetes mellitus type II, non insulin dependent (HCC)   6. PVD (peripheral vascular disease) (HCC)    PLAN:    In order of problems listed above: His myocardial l perfusion study 02/10/2021 normal proceed with his planned vascular surgery in my opinion he has optimized Study Highlights      The study is normal. The study is low risk.   Left ventricular function is normal. Nuclear stress EF: 57 %. The left ventricular ejection fraction is normal (55-65%). End diastolic cavity size is normal.   Prior study not available for comparison.    Is been more than 2 years since his last ischemic evaluation would need perfusion study done in the office prior to this elective surgery. Continue medical therapy including his beta-blocker with CAD and PVCs Continue high intensity statin if he has not had recent labs performed for lipids we will check in the office today along with a CMP Stable BP at target Appears to be poorly controlled he may be having gastroparesis   Next appointment: 6 months   Medication Adjustments/Labs and Tests Ordered: Current medicines are reviewed at length with the patient today.  Concerns regarding medicines are outlined above.  No orders of the defined types were placed in this encounter.  No orders of the defined types were placed in this encounter.   Chief Complaint  Patient presents with   Follow-up  He may be having lower extremity revascularization.  History of Present  Illness:    Dylan Anderson is a 60 y.o. male with a hx of CAD hypertension dyslipidemia type 2 diabetes and frequent PVCs as well as peripheral arterial disease last seen 03/18/2020 with claudication.  He underwent angiography 06/18/2020 with PCI and stent to the bilateral common iliac arteries for symptomatic claudication.  Compliance with diet, lifestyle and medications: Yes  He continues to have trouble with limiting severe claudication and abnormal noninvasive test from his lower extremity peripheral arterial disease and schedule for CTA with runoff and consideration of aortofemoral bypass bilaterally.  He has been having trouble eating and vomiting at home and is sick to the stomach in my office today.  Fasting blood sugar was greater than 300 No chest pain edema shortness of breath.  Left heart catheterization 06/19/2018 LEFT HEART CATH AND CORONARY ANGIOGRAPHY   Conclusion Ost RCA to Prox RCA lesion is 10% stenosed. Ost 1st Mrg to 1st Mrg lesion is 30% stenosed. Ost Cx to Prox Cx lesion is 30% stenosed. Ost Ramus to Ramus lesion is 80% stenosed. Ost LM to Mid LM lesion is 10% stenosed. Ost LAD to Prox LAD lesion is 15% stenosed. Mid Cx lesion is 20% stenosed. Prox LAD lesion is 35% stenosed. The left ventricular systolic function is normal. LV end diastolic pressure is normal. The left ventricular ejection fraction is greater than 65% by visual estimate.   Significant coronary calcification involving the left main, LAD, intermediate, and circumflex vessels.  There is smooth mild 10% narrowing of  the left main.  The LAD has mild proximal irregularity of 10 to 15% followed by 30 to 35% proximal stenosis before diagonal vessel;  the ramus intermediate vessel is a very small caliber vessel which has 80% ostial proximal narrowing; the left circumflex vessel is a codominant vessel with calcification proximally and in the major marginal branch.  There is 25% proximal narrowing and 30%  narrowing in the proximal circumflex marginal branch with an additional 20% narrowing in a inferior sub-branch; and RCA with mild haziness proximally with 10% narrowing.   Hyperdynamic LV function with an EF of at least 65% without focal segmental wall motion abnormalities.  LVEDP is 7 to 10 mm hg.  Coronary Diagrams  Diagnostic Dominance: Right  Past Medical History:  Diagnosis Date   Acute gout involving toe of left foot 09/21/2017   Arthritis    Back pain    Chest pain in adult 05/26/2018   Chronic cough 07/13/2017   Claudication (HCC)    Diabetes mellitus type II, non insulin dependent (HCC) 07/10/2012   Erectile dysfunction    Gastroesophageal reflux disease 04/27/2017   Hand pain 03/18/2017   Healthcare maintenance 07/12/2017   Hip pain 05/20/2017   Hyperlipidemia 01/25/2017   Hypertension 07/10/2012   Insomnia 02/06/2017   Low back pain 01/25/2017   Peripheral artery disease (HCC) 06/07/2017   PVD (peripheral vascular disease) (HCC)    Tobacco abuse 07/10/2012   Type 2 diabetes mellitus (HCC)     Past Surgical History:  Procedure Laterality Date   ABDOMINAL AORTOGRAM W/LOWER EXTREMITY N/A 06/18/2020   Procedure: ABDOMINAL AORTOGRAM W/LOWER EXTREMITY;  Surgeon: Cephus Shelling, MD;  [facility];  Service: Cardiovascular;  Laterality: N/A;   ILIAC ARTERY STENT     LEFT HEART CATH AND CORONARY ANGIOGRAPHY N/A 06/19/2018   Procedure: LEFT HEART CATH AND CORONARY ANGIOGRAPHY;  Surgeon: Lennette Bihari, MD;  Location: [facility];  Service: Cardiovascular;  Laterality: N/A;   PERIPHERAL VASCULAR INTERVENTION Bilateral 06/18/2020   Procedure: PERIPHERAL VASCULAR INTERVENTION;  Surgeon: Cephus Shelling, MD;  [facility];  Service: Cardiovascular;  Laterality: Bilateral;  common Iliac   stent Left    left common and left internal illiac stent    Current Medications: Current Meds  Medication Sig   amLODipine (NORVASC) 5 MG tablet TAKE 1  TABLET (5 MG TOTAL) BY MOUTH DAILY.   aspirin 81 MG EC tablet TAKE 1 TABLET (81 MG TOTAL) BY MOUTH DAILY.   atorvastatin (LIPITOR) 80 MG tablet TAKE 1 TABLET (80 MG TOTAL) BY MOUTH DAILY AT 6 PM.   clopidogrel (PLAVIX) 75 MG tablet Take 1 tablet (75 mg total) by mouth daily.   fenofibrate (TRICOR) 48 MG tablet Take 1 tablet (48 mg total) by mouth daily.   glucose blood (TRUE METRIX BLOOD GLUCOSE TEST) test strip Use as instructed. Check blood glucose level by fingerstick three times per day. E11.65   insulin aspart (NOVOLOG) 100 UNIT/ML FlexPen INJECT 10 UNITS INTO THE SKIN 2 (TWO) TIMES DAILY WITH A MEAL.   Insulin Glargine (BASAGLAR KWIKPEN) 100 UNIT/ML INJECT 70 UNITS INTO THE SKIN DAILY   insulin glargine (LANTUS) 100 UNIT/ML Solostar Pen Inject 70 Units into the skin daily. Inject 38 units in skin morning and inject 38 units in skin at bedtime.   Insulin Pen Needle 31G X 8 MM MISC USE AS DIRECTED WITH INSULIN   liraglutide (VICTOZA) 18 MG/3ML SOPN START 0.6MG  INTO THE SKIN DAILY FOR  7 DAYS,THEN 1.2MG  DAILY X7 DAYS, THEN 1.8MG  DAILY THEREAFTER.   metFORMIN (GLUCOPHAGE) 1000 MG tablet TAKE 1 TABLET (1,000 MG TOTAL) BY MOUTH 2 (TWO) TIMES DAILY WITH A MEAL.   metoprolol succinate (TOPROL-XL) 50 MG 24 hr tablet TAKE 1 TABLET (50 MG TOTAL) BY MOUTH AT BEDTIME.   nitroGLYCERIN (NITROSTAT) 0.4 MG SL tablet PLACE 1 TABLET (0.4 MG TOTAL) UNDER THE TONGUE EVERY 5 (FIVE) MINUTES AS NEEDED FOR CHEST PAIN.   pantoprazole (PROTONIX) 40 MG tablet TAKE 1 TABLET (40 MG TOTAL) BY MOUTH DAILY.   pregabalin (LYRICA) 100 MG capsule Take 100 mg by mouth 2 (two) times daily.   tamsulosin (FLOMAX) 0.4 MG CAPS capsule TAKE 1 CAPSULE (0.4 MG TOTAL) BY MOUTH DAILY.   TRUEplus Lancets 28G MISC Use as instructed. Check blood glucose level by fingerstick 3 times per day. E11.65     Allergies:   Patient has no known allergies.   Social History   Socioeconomic History   Marital status: Legally Separated    Spouse  name: Not on file   Number of children: Not on file   Years of education: Not on file   Highest education level: Not on file  Occupational History   Occupation: unemployed  Tobacco Use   Smoking status: Former    Packs/day: 0.50    Years: 17.00    Pack years: 8.50    Types: Cigarettes    Start date: 04/04/1978    Quit date: 01/2021    Years since quitting: 0.0   Smokeless tobacco: Never   Tobacco comments:    Max 1 ppd  Vaping Use   Vaping Use: Never used  Substance and Sexual Activity   Alcohol use: Yes    Comment: occ   Drug use: No   Sexual activity: Yes    Partners: Female  Other Topics Concern   Not on file  Social History Narrative   Not on file   Social Determinants of Health   Financial Resource Strain: Not on file  Food Insecurity: Not on file  Transportation Needs: Not on file  Physical Activity: Not on file  Stress: Not on file  Social Connections: Not on file     Family History: The patient's family history includes COPD in his sister; Heart attack in his maternal aunt and maternal uncle; Heart failure in his mother; Osteoarthritis in his mother; Stroke in his sister. ROS:   Please see the history of present illness.    All other systems reviewed and are negative.  EKGs/Labs/Other Studies Reviewed:    The following studies were reviewed today:  EKG:  EKG ordered today and personally reviewed.  The ekg ordered today demonstrates sinus tachycardia rate of 116 bpm nonspecific ST abnormality  Recent Labs: 12/30/2020: ALT 15 12/31/2020: BUN 19; Creatinine, Ser 1.30; Hemoglobin 14.5; Platelets 338; Potassium 4.3; Sodium 132  Recent Lipid Panel    Component Value Date/Time   CHOL 125 03/18/2020 0920   TRIG 217 (H) 03/18/2020 0920   HDL 46 03/18/2020 0920   CHOLHDL 2.7 03/18/2020 0920   CHOLHDL 5.7 07/10/2012 1301   VLDL 36 07/10/2012 1301   LDLCALC 45 03/18/2020 0920    Physical Exam:    VS:  BP 120/73 (BP Location: Left Arm, Patient Position:  Sitting, Cuff Size: Normal)   Pulse (!) 116   Ht 5\' 9"  (1.753 m)   Wt 135 lb (61.2 kg)   SpO2 97%   BMI 19.94 kg/m     Wt Readings  from Last 3 Encounters:  02/02/21 135 lb (61.2 kg)  01/19/21 136 lb 14.4 oz (62.1 kg)  12/30/20 135 lb (61.2 kg)     GEN: Nauseous well nourished, well developed in no acute distress HEENT: Normal NECK: No JVD; No carotid bruits LYMPHATICS: No lymphadenopathy CARDIAC: RRR, no murmurs, rubs, gallops RESPIRATORY:  Clear to auscultation without rales, wheezing or rhonchi  ABDOMEN: Soft, non-tender, non-distended MUSCULOSKELETAL:  No edema; No deformity  SKIN: Warm and dry NEUROLOGIC:  Alert and oriented x 3 PSYCHIATRIC:  Normal affect    Signed, Norman Herrlich, MD  02/02/2021 1:20 PM    [facility]

## 2021-02-02 NOTE — Patient Instructions (Signed)
Medication Instructions:  Your physician recommends that you continue on your current medications as directed. Please refer to the Current Medication list given to you today.  *If you need a refill on your cardiac medications before your next appointment, please call your pharmacy*   Lab Work: None If you have labs (blood work) drawn today and your tests are completely normal, you will receive your results only by: MyChart Message (if you have MyChart) OR A paper copy in the mail If you have any lab test that is abnormal or we need to change your treatment, we will call you to review the results.   Testing/Procedures:   [facility] [address] [place], [place] [address] Phone:  [phone]    Please arrive 15 minutes prior to your appointment time for registration and insurance purposes.  The test will take approximately 3 to 4 hours to complete; you may bring reading material.  If someone comes with you to your appointment, they will need to remain in the main lobby due to limited space in the testing area. **If you are pregnant or breastfeeding, please notify the nuclear lab prior to your appointment**  How to prepare for your Myocardial Perfusion Test: Do not eat or drink 3 hours prior to your test, except you may have water. Do not consume products containing caffeine (regular or decaffeinated) 12 hours prior to your test. (ex: coffee, chocolate, sodas, tea). Do bring a list of your current medications with you.  If not listed below, you may take your medications as normal. Do wear comfortable clothes (no dresses or overalls) and walking shoes, tennis shoes preferred (No heels or open toe shoes are allowed). Do NOT wear cologne, perfume, aftershave, or lotions (deodorant is allowed). If these instructions are not followed, your test will have to be rescheduled.  Please report to [address] for your test.  If you have questions or  concerns about your appointment, you can call the [facility] at [phone].  If you cannot keep your appointment, please provide 24 hours notification to the Nuclear Lab, to avoid a possible $50 charge to your account.    Follow-Up: At [facility], you and your health needs are our priority.  As part of our continuing mission to provide you with exceptional heart care, we have created designated Provider Care Teams.  These Care Teams include your primary Cardiologist (physician) and Advanced Practice Providers (APPs -  Physician Assistants and Nurse Practitioners) who all work together to provide you with the care you need, when you need it.  We recommend signing up for the patient portal called "MyChart".  Sign up information is provided on this After Visit Summary.  MyChart is used to connect with patients for Virtual Visits (Telemedicine).  Patients are able to view lab/test results, encounter notes, upcoming appointments, etc.  Non-urgent messages can be sent to your provider as well.   To learn more about what you can do with MyChart, go to [email or web address].    Your next appointment:   6 month(s)  The format for your next appointment:   In Person  Provider:   Brian Munley, MD   Other Instructions   

## 2021-02-03 ENCOUNTER — Other Ambulatory Visit: Payer: Self-pay

## 2021-02-03 ENCOUNTER — Telehealth (HOSPITAL_COMMUNITY): Payer: Self-pay | Admitting: *Deleted

## 2021-02-03 ENCOUNTER — Ambulatory Visit (HOSPITAL_COMMUNITY)
Admission: RE | Admit: 2021-02-03 | Discharge: 2021-02-03 | Disposition: A | Discharge status: Home / Self Care | Payer: Medicaid Other | Source: Ambulatory Visit | Attending: Vascular Surgery | Admitting: Vascular Surgery

## 2021-02-03 DIAGNOSIS — I779 Disorder of arteries and arterioles, unspecified: Secondary | ICD-10-CM

## 2021-02-03 LAB — POCT I-STAT CREATININE: Creatinine, Ser: 1.2 mg/dL (ref 0.61–1.24)

## 2021-02-03 MED ORDER — IOHEXOL 350 MG/ML SOLN
80.0000 mL | Freq: Once | INTRAVENOUS | Status: AC | PRN
  Administered 2021-02-03: 80 mL via INTRAVENOUS

## 2021-02-03 NOTE — Telephone Encounter (Signed)
Patient given detailed instructions per Myocardial Perfusion Study Information Sheet for the test on 02/10/21 at 8:00. Patient notified to arrive 15 minutes early and that it is imperative to arrive on time for appointment to keep from having the test rescheduled.  If you need to cancel or reschedule your appointment, please call the office within 24 hours of your appointment. . Patient verbalized understanding.Daneil Dolin

## 2021-02-04 NOTE — Addendum Note (Signed)
Addended by: Norman Herrlich on: 02/04/2021 02:29 PM   Modules accepted: Orders

## 2021-02-08 ENCOUNTER — Other Ambulatory Visit: Payer: Self-pay

## 2021-02-08 ENCOUNTER — Other Ambulatory Visit: Payer: Self-pay | Admitting: Pharmacist

## 2021-02-08 MED ORDER — LANTUS SOLOSTAR 100 UNIT/ML ~~LOC~~ SOPN
70.0000 [IU] | PEN_INJECTOR | Freq: Every day | SUBCUTANEOUS | 0 refills | Status: DC
  Filled 2021-02-08: qty 15, 21d supply, fill #0

## 2021-02-09 ENCOUNTER — Other Ambulatory Visit: Payer: Self-pay

## 2021-02-09 ENCOUNTER — Encounter: Payer: Self-pay | Admitting: Vascular Surgery

## 2021-02-09 ENCOUNTER — Ambulatory Visit (INDEPENDENT_AMBULATORY_CARE_PROVIDER_SITE_OTHER): Payer: Medicaid Other | Admitting: Vascular Surgery

## 2021-02-09 VITALS — BP 128/72 | HR 80 | Temp 98.0°F | Resp 16 | Ht 69.0 in | Wt 140.0 lb

## 2021-02-09 DIAGNOSIS — I70223 Atherosclerosis of native arteries of extremities with rest pain, bilateral legs: Secondary | ICD-10-CM | POA: Diagnosis not present

## 2021-02-09 DIAGNOSIS — I739 Peripheral vascular disease, unspecified: Secondary | ICD-10-CM | POA: Diagnosis not present

## 2021-02-09 DIAGNOSIS — I700 Atherosclerosis of aorta: Secondary | ICD-10-CM | POA: Diagnosis not present

## 2021-02-09 DIAGNOSIS — I779 Disorder of arteries and arterioles, unspecified: Secondary | ICD-10-CM

## 2021-02-09 NOTE — Progress Notes (Signed)
Patient name: Dylan Anderson MRN: [id number] DOB: Dec 12, 1960 Sex: male  REASON FOR VISIT: Follow-up after CTA for occluded iliac stents  HPI: Dylan Anderson is a 60 y.o. male with history of hypertension, hyperlipidemia, diabetes, coronary artery disease, peripheral vascular disease that presents for evaluation after CTA.  He was recently seen in the [facility] clinic with evidence of occluded bilateral common iliac stents and was sent for CTA.  He initially had a left common iliac stent at [facility] about 8 or 9 years ago.  Most recently I performed bilateral common iliac stenting on 06/18/2020.  He has been having right leg rest pain for 4-5 months.  He has severe rest pain in both lower extremities now with tissue loss on the right great toe.  We sent hi for CTA to evaluate for aortobifemoral bypass.  He is also getting clearance from Dr. Dulce Sellar and has a myocardial perfusion study tomorrow for ischemic evaluation.  Past Medical History:  Diagnosis Date   Acute gout involving toe of left foot 09/21/2017   Arthritis    Back pain    Chest pain in adult 05/26/2018   Chronic cough 07/13/2017   Claudication (HCC)    Diabetes mellitus type II, non insulin dependent (HCC) 07/10/2012   Erectile dysfunction    Gastroesophageal reflux disease 04/27/2017   Hand pain 03/18/2017   Healthcare maintenance 07/12/2017   Hip pain 05/20/2017   Hyperlipidemia 01/25/2017   Hypertension 07/10/2012   Insomnia 02/06/2017   Low back pain 01/25/2017   Peripheral artery disease (HCC) 06/07/2017   PVD (peripheral vascular disease) (HCC)    Tobacco abuse 07/10/2012   Type 2 diabetes mellitus (HCC)     Past Surgical History:  Procedure Laterality Date   ABDOMINAL AORTOGRAM W/LOWER EXTREMITY N/A 06/18/2020   Procedure: ABDOMINAL AORTOGRAM W/LOWER EXTREMITY;  Surgeon: Cephus Shelling, MD;  [facility];  Service: Cardiovascular;  Laterality: N/A;   ILIAC ARTERY STENT     LEFT HEART CATH AND CORONARY ANGIOGRAPHY  N/A 06/19/2018   Procedure: LEFT HEART CATH AND CORONARY ANGIOGRAPHY;  Surgeon: Lennette Bihari, MD;  Location: [facility];  Service: Cardiovascular;  Laterality: N/A;   PERIPHERAL VASCULAR INTERVENTION Bilateral 06/18/2020   Procedure: PERIPHERAL VASCULAR INTERVENTION;  Surgeon: Cephus Shelling, MD;  Location: [facility];  Service: Cardiovascular;  Laterality: Bilateral;  common Iliac   stent Left    left common and left internal illiac stent    Family History  Problem Relation Age of Onset   Heart failure Mother    Osteoarthritis Mother    COPD Sister    Stroke Sister    Heart attack Maternal Aunt    Heart attack Maternal Uncle     SOCIAL HISTORY: Social History   Tobacco Use   Smoking status: Former    Packs/day: 0.50    Years: 17.00    Pack years: 8.50    Types: Cigarettes    Start date: 04/04/1978    Quit date: 01/2021    Years since quitting: 0.1   Smokeless tobacco: Never   Tobacco comments:    Max 1 ppd  Substance Use Topics   Alcohol use: Yes    Comment: occ    No Known Allergies  Current Outpatient Medications  Medication Sig Dispense Refill   amLODipine (NORVASC) 5 MG tablet TAKE 1 TABLET (5 MG TOTAL) BY MOUTH DAILY. 30 tablet 3   aspirin 81 MG EC tablet TAKE 1 TABLET (81 MG  TOTAL) BY MOUTH DAILY. 120 tablet 0   atorvastatin (LIPITOR) 80 MG tablet TAKE 1 TABLET (80 MG TOTAL) BY MOUTH DAILY AT 6 PM. 90 tablet 0   clopidogrel (PLAVIX) 75 MG tablet Take 1 tablet (75 mg total) by mouth daily. 30 tablet 11   fenofibrate (TRICOR) 48 MG tablet Take 1 tablet (48 mg total) by mouth daily. 90 tablet 0   glucose blood (TRUE METRIX BLOOD GLUCOSE TEST) test strip Use as instructed. Check blood glucose level by fingerstick three times per day. E11.65 200 each 6   insulin aspart (NOVOLOG) 100 UNIT/ML FlexPen INJECT 10 UNITS INTO THE SKIN 2 (TWO) TIMES DAILY WITH A MEAL. 6 mL 6   insulin glargine (LANTUS SOLOSTAR) 100 UNIT/ML Solostar Pen Inject 70 Units  into the skin daily. 15 mL 0   Insulin Pen Needle 31G X 8 MM MISC USE AS DIRECTED WITH INSULIN 100 each 2   liraglutide (VICTOZA) 18 MG/3ML SOPN START 0.6MG  INTO THE SKIN DAILY FOR 7 DAYS,THEN 1.2MG  DAILY X7 DAYS, THEN 1.8MG  DAILY THEREAFTER. 6 mL 1   metFORMIN (GLUCOPHAGE) 1000 MG tablet TAKE 1 TABLET (1,000 MG TOTAL) BY MOUTH 2 (TWO) TIMES DAILY WITH A MEAL. 180 tablet 0   metoprolol succinate (TOPROL-XL) 50 MG 24 hr tablet TAKE 1 TABLET (50 MG TOTAL) BY MOUTH AT BEDTIME. 30 tablet 2   nitroGLYCERIN (NITROSTAT) 0.4 MG SL tablet PLACE 1 TABLET (0.4 MG TOTAL) UNDER THE TONGUE EVERY 5 (FIVE) MINUTES AS NEEDED FOR CHEST PAIN. 90 tablet 3   pantoprazole (PROTONIX) 40 MG tablet TAKE 1 TABLET (40 MG TOTAL) BY MOUTH DAILY. 90 tablet 0   pregabalin (LYRICA) 100 MG capsule Take 100 mg by mouth 2 (two) times daily.     tamsulosin (FLOMAX) 0.4 MG CAPS capsule TAKE 1 CAPSULE (0.4 MG TOTAL) BY MOUTH DAILY. 30 capsule 0   TRUEplus Lancets 28G MISC Use as instructed. Check blood glucose level by fingerstick 3 times per day. E11.65 200 each 6   buPROPion (WELLBUTRIN SR) 200 MG 12 hr tablet TAKE 1 TABLET (200 MG TOTAL) BY MOUTH 2 (TWO) TIMES DAILY. 180 tablet 0   pregabalin (LYRICA) 100 MG capsule TAKE 1 CAPSULE (100 MG TOTAL) BY MOUTH 3 (THREE) TIMES DAILY. 90 capsule 2   No current facility-administered medications for this visit.    REVIEW OF SYSTEMS:  [X]  denotes positive finding, [ ]  denotes negative finding Cardiac  Comments:  Chest pain or chest pressure:    Shortness of breath upon exertion:    Short of breath when lying flat:    Irregular heart rhythm:        Vascular    Pain in calf, thigh, or hip brought on by ambulation:    Pain in feet at night that wakes you up from your sleep:  x Bilateral  Blood clot in your veins:    Leg swelling:         Pulmonary    Oxygen at home:    Productive cough:     Wheezing:         Neurologic    Sudden weakness in arms or legs:     Sudden numbness in  arms or legs:     Sudden onset of difficulty speaking or slurred speech:    Temporary loss of vision in one eye:     Problems with dizziness:         Gastrointestinal    Blood in stool:     Vomited blood:  Genitourinary    Burning when urinating:     Blood in urine:        Psychiatric    Major depression:         Hematologic    Bleeding problems:    Problems with blood clotting too easily:        Skin    Rashes or ulcers:        Constitutional    Fever or chills:      PHYSICAL EXAM: Vitals:   02/09/21 0937  BP: 128/72  Pulse: 80  Resp: 16  Temp: 98 F (36.7 C)  TempSrc: Temporal  SpO2: 98%  Weight: 140 lb (63.5 kg)  Height: 5\' 9"  (1.753 m)    GENERAL: The patient is a well-nourished male, in no acute distress. The vital signs are documented above. CARDIAC: There is a regular rate and rhythm.  VASCULAR:  No palpable femoral pulses Right great toe tissue loss PULMONARY: No respiratory distress. ABDOMEN: Soft and non-tender. MUSCULOSKELETAL: There are no major deformities or cyanosis. NEUROLOGIC: No focal weakness or paresthesias are detected. SKIN: There are no ulcers or rashes noted. PSYCHIATRIC: The patient has a normal affect.  DATA:   CTA reviewed from 02/03/2021 shows evidence of occluded distal abdominal aorta as well as occluded bilateral iliac stents.  He does reconstitute distal external iliac arteries bilaterally.  He does have some SFA disease on the right.  Assessment/Plan:   60 year old male presents with critical limb ischemia including bilateral lower extremity rest pain and right lower extremity tissue loss with occluded common iliac stents.  These were read to iliac stents given initiate iliac stenting at [facility] about 8 to 9 years ago.  I have recommended aortobifemoral bypass after review of his CTA from 02/03/21.  I discussed steps of the surgery including 7 to 10 days in the hospital.  Discussed 1 in 3 risk of major complication  including bleeding, infection, renal failure, heart attack, stroke, risk of anesthesia etc. even death.  Ultimately he is very miserable and wants to proceed a soon as possible given the severity of his lower extremity symptoms.  I will post him for next Thursday 02/18/2021 for aortobifemoral bypass.  We will need to hold his Plavix for 7 days preop and he is currently undergoing cardiac clearance and getting myocardial perfusion study tomorrow for ischemic evaluation and followed by Dr. 02/20/2021.  Discussed The only reason I could see his surgery being delayed as if Dr. Dulce Sellar feels he needs a cath after his Myoview.  All questions answered.  The surgery was discussed in detail.  Dulce Sellar, MD [facility] Office: [phone]   [phone]

## 2021-02-10 ENCOUNTER — Ambulatory Visit (INDEPENDENT_AMBULATORY_CARE_PROVIDER_SITE_OTHER): Payer: Medicaid Other

## 2021-02-10 DIAGNOSIS — I25118 Atherosclerotic heart disease of native coronary artery with other forms of angina pectoris: Secondary | ICD-10-CM

## 2021-02-10 LAB — MYOCARDIAL PERFUSION IMAGING
LV dias vol: 93 mL (ref 62–150)
LV sys vol: 40 mL
Nuc Stress EF: 57 %
Peak HR: 101 {beats}/min
Rest HR: 76 {beats}/min
Rest Nuclear Isotope Dose: 8.4 mCi
SDS: 1
SRS: 0
SSS: 1
Stress Nuclear Isotope Dose: 27.1 mCi
TID: 1.13

## 2021-02-10 MED ORDER — TECHNETIUM TC 99M TETROFOSMIN IV KIT
27.1000 | PACK | Freq: Once | INTRAVENOUS | Status: AC | PRN
  Administered 2021-02-10: 27.1 via INTRAVENOUS

## 2021-02-10 MED ORDER — REGADENOSON 0.4 MG/5ML IV SOLN
0.4000 mg | Freq: Once | INTRAVENOUS | Status: AC
  Administered 2021-02-10: 0.4 mg via INTRAVENOUS

## 2021-02-10 MED ORDER — TECHNETIUM TC 99M TETROFOSMIN IV KIT
8.4000 | PACK | Freq: Once | INTRAVENOUS | Status: AC | PRN
  Administered 2021-02-10: 8.4 via INTRAVENOUS

## 2021-02-11 ENCOUNTER — Telehealth: Payer: Self-pay

## 2021-02-11 NOTE — Telephone Encounter (Signed)
-----   Message from Baldo Daub, MD sent at 02/10/2021  7:59 PM EST ----- Normal or stable result

## 2021-02-11 NOTE — Telephone Encounter (Signed)
  Patient is returning call for results per answering service

## 2021-02-11 NOTE — Telephone Encounter (Signed)
Left message on patients voicemail to please return our call.   

## 2021-02-11 NOTE — Telephone Encounter (Signed)
Spoke with patient regarding results and recommendation.  Patient verbalizes understanding and is agreeable to plan of care. Advised patient to call back with any issues or concerns.  

## 2021-02-12 NOTE — Progress Notes (Signed)
Surgical Instructions    Your procedure is scheduled on Thursday, November 17th, 2022.   Report to [facility] Main Entrance "A" at 05:30 A.M., then check in with the Admitting office.  Call this number if you have problems the morning of surgery:  [phone]   If you have any questions prior to your surgery date call [phone]: Open Monday-Friday 8am-4pm    Remember:  Do not eat or drink after midnight the night before your surgery     Take these medicines the morning of surgery with A SIP OF WATER:  amLODipine (NORVASC)  pregabalin (LYRICA)  pantoprazole (PROTONIX) tamsulosin (FLOMAX)  If needed:   nitroGLYCERIN (NITROSTAT)    Follow your surgeon's instructions on when to stop Aspirin and Plavix.  If no instructions were given by your surgeon then you will need to call the office to get those instructions.     As of today, STOP taking any Aspirin (unless otherwise instructed by your surgeon) Aleve, Naproxen, Ibuprofen, Motrin, Advil, Goody's, BC's, all herbal medications, fish oil, and all vitamins.   WHAT DO I DO ABOUT MY DIABETES MEDICATION?   Do not metFORMIN (GLUCOPHAGE) the morning of surgery.  Do not take insulin aspart (NOVOLOG) the morning of surgery   THE NIGHT BEFORE SURGERY, take 35 units of Insulin Glargine (BASAGLAR KWIKPEN) - 50% of your regular dose      HOW TO MANAGE YOUR DIABETES BEFORE AND AFTER SURGERY  Why is it important to control my blood sugar before and after surgery? Improving blood sugar levels before and after surgery helps healing and can limit problems. A way of improving blood sugar control is eating a healthy diet by:  Eating less sugar and carbohydrates  Increasing activity/exercise  Talking with your doctor about reaching your blood sugar goals High blood sugars (greater than 180 mg/dL) can raise your risk of infections and slow your recovery, so you will need to focus on controlling your diabetes during the weeks before  surgery. Make sure that the doctor who takes care of your diabetes knows about your planned surgery including the date and location.  How do I manage my blood sugar before surgery? Check your blood sugar at least 4 times a day, starting 2 days before surgery, to make sure that the level is not too high or low.  Check your blood sugar the morning of your surgery when you wake up and every 2 hours until you get to the Short Stay unit.  If your blood sugar is less than 70 mg/dL, you will need to treat for low blood sugar: Do not take insulin. Treat a low blood sugar (less than 70 mg/dL) with  cup of clear juice (cranberry or apple), 4 glucose tablets, OR glucose gel. Recheck blood sugar in 15 minutes after treatment (to make sure it is greater than 70 mg/dL). If your blood sugar is not greater than 70 mg/dL on recheck, call [phone] for further instructions. Report your blood sugar to the short stay nurse when you get to Short Stay.  If you are admitted to the hospital after surgery: Your blood sugar will be checked by the staff and you will probably be given insulin after surgery (instead of oral diabetes medicines) to make sure you have good blood sugar levels. The goal for blood sugar control after surgery is 80-180 mg/dL.    After your COVID test   You are not required to quarantine however you are required to wear a well-fitting mask when you  are out and around people not in your household.  If your mask becomes wet or soiled, replace with a new one.  Wash your hands often with soap and water for 20 seconds or clean your hands with an alcohol-based hand sanitizer that contains at least 60% alcohol.  Do not share personal items.  Notify your provider: if you are in close contact with someone who has COVID  or if you develop a fever of 100.4 or greater, sneezing, cough, sore throat, shortness of breath or body aches.   The day of surgery:          Do not wear jewelry  Do not  wear lotions, powders, colognes, or deodorant. Men may shave face and neck. Do not bring valuables to the hospital.              [facility] is not responsible for any belongings or valuables.  Do NOT Smoke (Tobacco/Vaping)  24 hours prior to your procedure  If you use a CPAP at night, you may bring your mask for your overnight stay.   Contacts, glasses, hearing aids, dentures or partials may not be worn into surgery, please bring cases for these belongings   For patients admitted to the hospital, discharge time will be determined by your treatment team.   Patients discharged the day of surgery will not be allowed to drive home, and someone needs to stay with them for 24 hours.  NO VISITORS WILL BE ALLOWED IN PRE-OP WHERE PATIENTS ARE PREPPED FOR SURGERY.  ONLY 1 SUPPORT PERSON MAY BE PRESENT IN THE WAITING ROOM WHILE YOU ARE IN SURGERY.  IF YOU ARE TO BE ADMITTED, ONCE YOU ARE IN YOUR ROOM YOU WILL BE ALLOWED TWO (2) VISITORS. 1 (ONE) VISITOR MAY STAY OVERNIGHT BUT MUST ARRIVE TO THE ROOM BY 8pm.  Minor children may have two parents present. Special consideration for safety and communication needs will be reviewed on a case by case basis.  Special instructions:    Oral Hygiene is also important to reduce your risk of infection.  Remember - BRUSH YOUR TEETH THE MORNING OF SURGERY WITH YOUR REGULAR TOOTHPASTE   [facility]- Preparing For Surgery  Before surgery, you can play an important role. Because skin is not sterile, your skin needs to be as free of germs as possible. You can reduce the number of germs on your skin by washing with CHG (chlorahexidine gluconate) Soap before surgery.  CHG is an antiseptic cleaner which kills germs and bonds with the skin to continue killing germs even after washing.     Please do not use if you have an allergy to CHG or antibacterial soaps. If your skin becomes reddened/irritated stop using the CHG.  Do not shave (including legs and underarms) for at  least 48 hours prior to first CHG shower. It is OK to shave your face.  Please follow these instructions carefully.     Shower the NIGHT BEFORE SURGERY and the MORNING OF SURGERY with CHG Soap.   If you chose to wash your hair, wash your hair first as usual with your normal shampoo. After you shampoo, rinse your hair and body thoroughly to remove the shampoo.  Then [employer] and genitals (private parts) with your normal soap and rinse thoroughly to remove soap.  After that Use CHG Soap as you would any other liquid soap. You can apply CHG directly to the skin and wash gently with a scrungie or a clean washcloth.   Apply  the CHG Soap to your body ONLY FROM THE NECK DOWN.  Do not use on open wounds or open sores. Avoid contact with your eyes, ears, mouth and genitals (private parts). Wash Face and genitals (private parts)  with your normal soap.   Wash thoroughly, paying special attention to the area where your surgery will be performed.  Thoroughly rinse your body with warm water from the neck down.  DO NOT shower/wash with your normal soap after using and rinsing off the CHG Soap.  Pat yourself dry with a CLEAN TOWEL.  Wear CLEAN PAJAMAS to bed the night before surgery  Place CLEAN SHEETS on your bed the night before your surgery  DO NOT SLEEP WITH PETS.   Day of Surgery:  Take a shower with CHG soap. Wear Clean/Comfortable clothing the morning of surgery Do not apply any deodorants/lotions.   Remember to brush your teeth WITH YOUR REGULAR TOOTHPASTE.   Please read over the following fact sheets that you were given.

## 2021-02-15 ENCOUNTER — Encounter (HOSPITAL_COMMUNITY)
Admission: RE | Admit: 2021-02-15 | Discharge: 2021-02-15 | Disposition: A | Discharge status: Home / Self Care | Payer: Medicaid Other | Source: Ambulatory Visit | Attending: Vascular Surgery | Admitting: Vascular Surgery

## 2021-02-15 ENCOUNTER — Other Ambulatory Visit: Payer: Self-pay

## 2021-02-15 ENCOUNTER — Encounter (HOSPITAL_COMMUNITY): Payer: Self-pay

## 2021-02-15 VITALS — BP 154/95 | HR 98 | Temp 97.9°F | Resp 18 | Ht 69.0 in | Wt 138.0 lb

## 2021-02-15 DIAGNOSIS — E1151 Type 2 diabetes mellitus with diabetic peripheral angiopathy without gangrene: Secondary | ICD-10-CM | POA: Diagnosis not present

## 2021-02-15 DIAGNOSIS — I779 Disorder of arteries and arterioles, unspecified: Secondary | ICD-10-CM

## 2021-02-15 DIAGNOSIS — Z01812 Encounter for preprocedural laboratory examination: Secondary | ICD-10-CM | POA: Diagnosis present

## 2021-02-15 DIAGNOSIS — I10 Essential (primary) hypertension: Secondary | ICD-10-CM | POA: Diagnosis not present

## 2021-02-15 DIAGNOSIS — I2510 Atherosclerotic heart disease of native coronary artery without angina pectoris: Secondary | ICD-10-CM | POA: Insufficient documentation

## 2021-02-15 DIAGNOSIS — N390 Urinary tract infection, site not specified: Secondary | ICD-10-CM

## 2021-02-15 DIAGNOSIS — Z20822 Contact with and (suspected) exposure to covid-19: Secondary | ICD-10-CM | POA: Insufficient documentation

## 2021-02-15 DIAGNOSIS — Z01818 Encounter for other preprocedural examination: Secondary | ICD-10-CM

## 2021-02-15 DIAGNOSIS — Z7901 Long term (current) use of anticoagulants: Secondary | ICD-10-CM | POA: Insufficient documentation

## 2021-02-15 HISTORY — DX: Angina pectoris, unspecified: I20.9

## 2021-02-15 HISTORY — DX: Anxiety disorder, unspecified: F41.9

## 2021-02-15 HISTORY — DX: Dyspnea, unspecified: R06.00

## 2021-02-15 HISTORY — DX: Depression, unspecified: F32.A

## 2021-02-15 LAB — APTT: aPTT: 34 seconds (ref 24–36)

## 2021-02-15 LAB — URINALYSIS, ROUTINE W REFLEX MICROSCOPIC
Bilirubin Urine: NEGATIVE
Glucose, UA: >=500 mg/dL — AB
Ketones, ur: NEGATIVE mg/dL
Leukocytes,Ua: NEGATIVE
Nitrite: NEGATIVE
Protein, ur: >300 mg/dL — AB
Specific Gravity, Urine: 1.02 (ref 1.005–1.030)
pH: 6 (ref 5.0–8.0)

## 2021-02-15 LAB — COMPREHENSIVE METABOLIC PANEL
ALT: 15 U/L (ref 0–44)
AST: 12 U/L — ABNORMAL LOW (ref 15–41)
Albumin: 3.3 g/dL — ABNORMAL LOW (ref 3.5–5.0)
Alkaline Phosphatase: 100 U/L (ref 38–126)
Anion gap: 11 (ref 5–15)
BUN: 16 mg/dL (ref 6–20)
CO2: 26 mmol/L (ref 22–32)
Calcium: 9.4 mg/dL (ref 8.9–10.3)
Chloride: 99 mmol/L (ref 98–111)
Creatinine, Ser: 0.98 mg/dL (ref 0.61–1.24)
GFR, Estimated: >60 mL/min (ref >60)
Glucose, Bld: 401 mg/dL — ABNORMAL HIGH (ref 70–99)
Potassium: 4.2 mmol/L (ref 3.5–5.1)
Sodium: 136 mmol/L (ref 135–145)
Total Bilirubin: 0.4 mg/dL (ref 0.3–1.2)
Total Protein: 7.1 g/dL (ref 6.5–8.1)

## 2021-02-15 LAB — SURGICAL PCR SCREEN
MRSA, PCR: NEGATIVE
Staphylococcus aureus: POSITIVE — AB

## 2021-02-15 LAB — URINALYSIS, MICROSCOPIC (REFLEX)

## 2021-02-15 LAB — CBC
HCT: 44.5 % (ref 39.0–52.0)
Hemoglobin: 14.6 g/dL (ref 13.0–17.0)
MCH: 28.3 pg (ref 26.0–34.0)
MCHC: 32.8 g/dL (ref 30.0–36.0)
MCV: 86.4 fL (ref 80.0–100.0)
Platelets: 426 10*3/uL — ABNORMAL HIGH (ref 150–400)
RBC: 5.15 MIL/uL (ref 4.22–5.81)
RDW: 12.5 % (ref 11.5–15.5)
WBC: 8.4 10*3/uL (ref 4.0–10.5)
nRBC: 0 % (ref 0.0–0.2)

## 2021-02-15 LAB — HEMOGLOBIN A1C
Hgb A1c MFr Bld: 10.7 % — ABNORMAL HIGH (ref 4.8–5.6)
Mean Plasma Glucose: 260.39 mg/dL

## 2021-02-15 LAB — PROTIME-INR
INR: 0.9 (ref 0.8–1.2)
Prothrombin Time: 12.6 seconds (ref 11.4–15.2)

## 2021-02-15 LAB — GLUCOSE, CAPILLARY: Glucose-Capillary: 381 mg/dL — ABNORMAL HIGH (ref 70–99)

## 2021-02-15 LAB — SARS CORONAVIRUS 2 (TAT 6-24 HRS): SARS Coronavirus 2: NEGATIVE

## 2021-02-15 MED ORDER — SULFAMETHOXAZOLE-TRIMETHOPRIM 400-80 MG PO TABS: 1.0000 | ORAL_TABLET | Freq: Two times a day (BID) | ORAL | 0 refills | Status: DC

## 2021-02-15 NOTE — Progress Notes (Addendum)
PCP - Robyne Peers, FNP Cardiologist - Norman Herrlich, MD  PPM/ICD - denies Device Orders - n/a Rep Notified - n/a  Chest x-ray - n/a EKG - 02/02/2021 Stress Test - 02/10/2021 ECHO - 2013 (CE) Cardiac Cath - 06/19/2018  Sleep Study - denies CPAP - n/a  Fasting Blood Sugar - 180-360 Checks Blood Sugar 2 times a day CBG today - 381 A1C - done in PAT on 02/15/2021  Blood Thinner Instructions: Plavix - last dose - 02/11/2021 per patient  Aspirin Instructions: Aspirin - last dose - 02/11/2021 per patient  Patient was instructed: As of today, STOP taking any Aspirin (unless otherwise instructed by your surgeon) Aleve, Naproxen, Ibuprofen, Motrin, Advil, Goody's, BC's, all herbal medications, fish oil, and all vitamins.  ERAS Protcol - n/a   COVID TEST- done in PAT on 02/15/2021  Anesthesia review: yes - cardiac history. CBG elevated in PAT 381 (patient ate this AM a sausage biscuit, he drank an un-sweet boost, and he took his diabetes medication. Per patient, his CBG is elevated all the time even fasting. Patient verbalized that when his CBG is in 100 s he is not feeling well. Patient said that his PCP is aware. In PAT patient was complaining only of pain in his legs, no other distress. Patient was instructed to take his diabetes medicine as scheduled and to check his CBG more often before surgery day and if he will start to have discomfort, to call his PCP. Patient verbalized understanding. A1C was checked in PAT because patient wasn't sure when was checked last time.   Abnormal labs in PAT - Dr. Chestine Spore notified.  Patient denies shortness of breath, fever, cough and chest pain at PAT appointment   All instructions explained to the patient, with a verbal understanding of the material. Patient agrees to go over the instructions while at home for a better understanding. Patient also instructed to self quarantine after being tested for COVID-19. The opportunity to ask questions was  provided.

## 2021-02-15 NOTE — Progress Notes (Addendum)
Abnormal labs in PAT: A1C 10.7. Urinalysis: Bacteria, UA - rare; Protein, UA - > 300; Hgb urine dipstick - trace; Glucose, UA - > 500. Dr. Chestine Spore office was called with these results. The surgical scheduler was notified.

## 2021-02-16 ENCOUNTER — Ambulatory Visit: Payer: Medicaid Other | Admitting: Vascular Surgery

## 2021-02-16 NOTE — Progress Notes (Signed)
Anesthesia Chart Review:  Follows with cardiology for history of CAD, HTN, frequent PVCs.  He was seen by Dr. Dulce Sellar 02/02/2021 for preop evaluation.  Nuclear stress was ordered.  Test was done 02/10/2021 and was low risk, nonischemic, normal LVEF.  Dr. Dulce Sellar commented on result stating, "His myocardial perfusion study 02/10/2021 normal proceed with his planned vascular surgery in my opinion he has optimized."  Follows with vascular surgery for history of PAD.  He underwent bilateral iliac stenting 06/18/2020.  When last seen by Dr. Chestine Spore 02/09/2021 he was noted to have critical limb ischemia including bilateral lower extremity rest pain and right lower extremity tissue loss with occluded common iliac stents.  IDDM 2, markedly uncontrolled, glucose 401 on preop labs, A1c 10.7.  Remainder of preop labs unremarkable.  EKG 02/02/2021: Sinus tachycardia.  Rate 116.  Nonspecific ST abnormality.  CTA aortobifem 02/23/21: IMPRESSION: VASCULAR   1. 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 2. Reconstitution in the distal external iliac arteries bilaterally through the inflow from the deep circumflex iliac and inferior epigastric arteries. 3. Additional focal moderate stenosis in the distal right SFA in the region of the adductor canal. 4. Patent 3 vessel runoff to the ankles bilaterally. 5. Moderate focal stenosis at the origin of the SMA.   NON-VASCULAR   1. No acute abnormality within the pelvis.  Nuclear stress 02/10/2021:   The study is normal. The study is low risk.   Left ventricular function is normal. Nuclear stress EF: 57 %. The left ventricular ejection fraction is normal (55-65%). End diastolic cavity size is normal.   Prior study not available for comparison.  Cath 06/19/2018: Significant coronary calcification involving the left main, LAD, intermediate, and circumflex vessels.  There is smooth mild 10% narrowing  of the left main.  The LAD has mild proximal irregularity of 10 to 15% followed by 30 to 35% proximal stenosis before diagonal vessel;  the ramus intermediate vessel is a very small caliber vessel which has 80% ostial proximal narrowing; the left circumflex vessel is a codominant vessel with calcification proximally and in the major marginal branch.  There is 25% proximal narrowing and 30% narrowing in the proximal circumflex marginal branch with an additional 20% narrowing in a inferior sub-branch; and RCA with mild haziness proximally with 10% narrowing.   Hyperdynamic LV function with an EF of at least 65% without focal segmental wall motion abnormalities.  LVEDP is 7 to 10 mm hg.   RECOMMENDATION: Medical therapy for optimal blood pressure control with target blood pressure less than 120/80.  Consider adding amlodipine or beta-blocker to medical regimen.  Aggressive lipid-lowering therapy with target LDL less than 70 in an attempt to induce plaque regression.  Aspirin therapy for antiplatelet benefit.    Zannie Cove [facility]/Anesthesiology Phone [phone] 02/16/2021 4:28 PM

## 2021-02-16 NOTE — Anesthesia Preprocedure Evaluation (Addendum)
Anesthesia Evaluation  Patient identified by MRN, date of birth, ID band Patient awake    Reviewed: Allergy & Precautions, NPO status , Patient's Chart, lab work & pertinent test results  Airway Mallampati: II  TM Distance: >3 FB Neck ROM: Full    Dental no notable dental hx. (+) Poor Dentition, Dental Advisory Given,    Pulmonary Patient abstained from smoking., former smoker,    Pulmonary exam normal breath sounds clear to auscultation       Cardiovascular hypertension, Pt. on medications and Pt. on home beta blockers + CAD and + Peripheral Vascular Disease  Normal cardiovascular exam Rhythm:Regular Rate:Normal  02/10/21 Myocardial perfusion Study Chamber Size Left ventricular function is normal. Nuclear stress EF: 57 %. The left ventricular ejection fraction is normal (55-65%). End diastolic cavity size is normal. Stress Combined Conclusion The study is normal. The study is low risk.     Neuro/Psych    GI/Hepatic GERD  ,  Endo/Other  diabetes, Type 1, Insulin Dependent  Renal/GU Lab Results      Component                Value               Date                      CREATININE               0.98                02/15/2021                BUN                      16                  02/15/2021                NA                       136                 02/15/2021                K                        4.2                 02/15/2021                CL                       99                  02/15/2021                CO2                      26                  02/15/2021                Musculoskeletal  (+) Arthritis ,   Abdominal   Peds  Hematology Lab Results      Component                Value  Date                      WBC                      8.4                 02/15/2021                HGB                      14.6                02/15/2021                HCT                      44.5                 02/15/2021                MCV                      86.4                02/15/2021                PLT                      426 (H)             02/15/2021              Anesthesia Other Findings   Reproductive/Obstetrics                           Anesthesia Physical Anesthesia Plan  ASA: 3  Anesthesia Plan: General   Post-op Pain Management:    Induction: Intravenous  PONV Risk Score and Plan: 3 and Treatment may vary due to age or medical condition, Ondansetron, Midazolam and Dexamethasone  Airway Management Planned: Oral ETT  Additional Equipment: Arterial line  Intra-op Plan:   Post-operative Plan: Extubation in OR  Informed Consent: I have reviewed the patients History and Physical, chart, labs and discussed the procedure including the risks, benefits and alternatives for the proposed anesthesia with the patient or authorized representative who has indicated his/her understanding and acceptance.     Dental advisory given  Plan Discussed with: CRNA and Anesthesiologist  Anesthesia Plan Comments: (GA + aline+ 2 Large Bore IVs  >( 18g)  PAT note by Antionette Poles, PA-C: Follows with cardiology for history of CAD, HTN, frequent PVCs.  He was seen by Dr. Dulce Sellar 02/02/2021 for preop evaluation.  Nuclear stress was ordered.  Test was done 02/10/2021 and was low risk, nonischemic, normal LVEF.  Dr. Dulce Sellar commented on result stating, "His myocardial perfusion study 02/10/2021 normal proceed with his planned vascular surgery in my opinion he has optimized."  Follows with vascular surgery for history of PAD.  He underwent bilateral iliac stenting 06/18/2020.  When last seen by Dr. Chestine Spore 02/09/2021 he was noted to have critical limb ischemia including bilateral lower extremity rest pain and right lower extremity tissue loss with occluded common iliac stents.  IDDM 2, markedly uncontrolled, glucose 401 on preop labs, A1c 10.7.  Remainder of preop labs  unremarkable.  EKG 02/02/2021: Sinus tachycardia.  Rate 116.  Nonspecific ST abnormality.  CTA aortobifem 02/23/21: IMPRESSION: VASCULAR  1. 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 2. Reconstitution in the distal external iliac arteries bilaterally through the inflow from the deep circumflex iliac and inferior epigastric arteries. 3. Additional focal moderate stenosis in the distal right SFA in the region of the adductor canal. 4. Patent 3 vessel runoff to the ankles bilaterally. 5. Moderate focal stenosis at the origin of the SMA.  NON-VASCULAR  1. No acute abnormality within the pelvis.  Nuclear stress 02/10/2021: . The study is normal. The study is low risk. . Left ventricular function is normal. Nuclear stress EF: 57 %. The left ventricular ejection fraction is normal (55-65%). End diastolic cavity size is normal. . Prior study not available for comparison.  Cath 06/19/2018: Significant coronary calcification involving the left main, LAD, intermediate, and circumflex vessels.  There is smooth mild 10% narrowing of the left main.  The LAD has mild proximal irregularity of 10 to 15% followed by 30 to 35% proximal stenosis before diagonal vessel;  the ramus intermediate vessel is a very small caliber vessel which has 80% ostial proximal narrowing; the left circumflex vessel is a codominant vessel with calcification proximally and in the major marginal branch.  There is 25% proximal narrowing and 30% narrowing in the proximal circumflex marginal branch with an additional 20% narrowing in a inferior sub-branch; and RCA with mild haziness proximally with 10% narrowing.  Hyperdynamic LV function with an EF of at least 65% without focal segmental wall motion abnormalities.  LVEDP is 7 to 10 mm hg.  RECOMMENDATION: Medical therapy for optimal blood pressure control with target blood pressure less than 120/80.   Consider adding amlodipine or beta-blocker to medical regimen.  Aggressive lipid-lowering therapy with target LDL less than 70 in an attempt to induce plaque regression.  Aspirin therapy for antiplatelet benefit.  )      Anesthesia Quick Evaluation

## 2021-02-18 ENCOUNTER — Encounter (HOSPITAL_COMMUNITY)
Admission: RE | Disposition: A | Discharge status: Home Health | Payer: Self-pay | Source: Ambulatory Visit | Attending: Vascular Surgery

## 2021-02-18 ENCOUNTER — Inpatient Hospital Stay (HOSPITAL_COMMUNITY): Payer: Medicaid Other | Admitting: Physician Assistant

## 2021-02-18 ENCOUNTER — Inpatient Hospital Stay (HOSPITAL_COMMUNITY): Payer: Medicaid Other | Admitting: Anesthesiology

## 2021-02-18 ENCOUNTER — Other Ambulatory Visit: Payer: Self-pay

## 2021-02-18 ENCOUNTER — Inpatient Hospital Stay (HOSPITAL_COMMUNITY)
Admission: RE | Admit: 2021-02-18 | Discharge: 2021-02-25 | DRG: 271 | Disposition: A | Discharge status: Home Health | Payer: Medicaid Other | Source: Ambulatory Visit | Attending: Vascular Surgery | Admitting: Vascular Surgery

## 2021-02-18 ENCOUNTER — Encounter (HOSPITAL_COMMUNITY): Payer: Self-pay | Admitting: Vascular Surgery

## 2021-02-18 ENCOUNTER — Inpatient Hospital Stay (HOSPITAL_COMMUNITY): Payer: Medicaid Other

## 2021-02-18 DIAGNOSIS — Z794 Long term (current) use of insulin: Secondary | ICD-10-CM | POA: Diagnosis not present

## 2021-02-18 DIAGNOSIS — E785 Hyperlipidemia, unspecified: Secondary | ICD-10-CM | POA: Diagnosis present

## 2021-02-18 DIAGNOSIS — Z20822 Contact with and (suspected) exposure to covid-19: Secondary | ICD-10-CM | POA: Diagnosis present

## 2021-02-18 DIAGNOSIS — Z23 Encounter for immunization: Secondary | ICD-10-CM | POA: Diagnosis not present

## 2021-02-18 DIAGNOSIS — I70245 Atherosclerosis of native arteries of left leg with ulceration of other part of foot: Secondary | ICD-10-CM | POA: Diagnosis not present

## 2021-02-18 DIAGNOSIS — D62 Acute posthemorrhagic anemia: Secondary | ICD-10-CM | POA: Diagnosis not present

## 2021-02-18 DIAGNOSIS — I2510 Atherosclerotic heart disease of native coronary artery without angina pectoris: Secondary | ICD-10-CM | POA: Diagnosis present

## 2021-02-18 DIAGNOSIS — Z7984 Long term (current) use of oral hypoglycemic drugs: Secondary | ICD-10-CM | POA: Diagnosis not present

## 2021-02-18 DIAGNOSIS — Z7902 Long term (current) use of antithrombotics/antiplatelets: Secondary | ICD-10-CM

## 2021-02-18 DIAGNOSIS — E1151 Type 2 diabetes mellitus with diabetic peripheral angiopathy without gangrene: Secondary | ICD-10-CM | POA: Diagnosis not present

## 2021-02-18 DIAGNOSIS — Z87891 Personal history of nicotine dependence: Secondary | ICD-10-CM

## 2021-02-18 DIAGNOSIS — Z8249 Family history of ischemic heart disease and other diseases of the circulatory system: Secondary | ICD-10-CM | POA: Diagnosis not present

## 2021-02-18 DIAGNOSIS — I70223 Atherosclerosis of native arteries of extremities with rest pain, bilateral legs: Secondary | ICD-10-CM | POA: Diagnosis not present

## 2021-02-18 DIAGNOSIS — Z7982 Long term (current) use of aspirin: Secondary | ICD-10-CM

## 2021-02-18 DIAGNOSIS — I7409 Other arterial embolism and thrombosis of abdominal aorta: Secondary | ICD-10-CM

## 2021-02-18 DIAGNOSIS — T82868A Thrombosis of vascular prosthetic devices, implants and grafts, initial encounter: Secondary | ICD-10-CM | POA: Diagnosis not present

## 2021-02-18 DIAGNOSIS — I10 Essential (primary) hypertension: Secondary | ICD-10-CM | POA: Diagnosis present

## 2021-02-18 DIAGNOSIS — I70235 Atherosclerosis of native arteries of right leg with ulceration of other part of foot: Secondary | ICD-10-CM | POA: Diagnosis not present

## 2021-02-18 DIAGNOSIS — Z823 Family history of stroke: Secondary | ICD-10-CM

## 2021-02-18 DIAGNOSIS — K219 Gastro-esophageal reflux disease without esophagitis: Secondary | ICD-10-CM | POA: Diagnosis present

## 2021-02-18 DIAGNOSIS — Z95828 Presence of other vascular implants and grafts: Principal | ICD-10-CM

## 2021-02-18 HISTORY — PX: AORTA - BILATERAL FEMORAL ARTERY BYPASS GRAFT: SHX1175

## 2021-02-18 LAB — POCT I-STAT 7, (LYTES, BLD GAS, ICA,H+H)
Acid-base deficit: 1 mmol/L (ref 0.0–2.0)
Acid-base deficit: 1 mmol/L (ref 0.0–2.0)
Bicarbonate: 25 mmol/L (ref 20.0–28.0)
Bicarbonate: 24.6 mmol/L (ref 20.0–28.0)
Calcium, Ion: 1.27 mmol/L (ref 1.15–1.40)
Calcium, Ion: 1.17 mmol/L (ref 1.15–1.40)
HCT: 29 % — ABNORMAL LOW (ref 39.0–52.0)
HCT: 23 % — ABNORMAL LOW (ref 39.0–52.0)
Hemoglobin: 9.9 g/dL — ABNORMAL LOW (ref 13.0–17.0)
Hemoglobin: 7.8 g/dL — ABNORMAL LOW (ref 13.0–17.0)
O2 Saturation: 100 %
O2 Saturation: 100 %
Patient temperature: 35.7
Patient temperature: 35
Potassium: 3.7 mmol/L (ref 3.5–5.1)
Potassium: 3.9 mmol/L (ref 3.5–5.1)
Sodium: 138 mmol/L (ref 135–145)
Sodium: 140 mmol/L (ref 135–145)
TCO2: 26 mmol/L (ref 22–32)
TCO2: 26 mmol/L (ref 22–32)
pCO2 arterial: 44.8 mmHg (ref 32.0–48.0)
pCO2 arterial: 38.7 mmHg (ref 32.0–48.0)
pH, Arterial: 7.348 — ABNORMAL LOW (ref 7.350–7.450)
pH, Arterial: 7.402 (ref 7.350–7.450)
pO2, Arterial: 264 mmHg — ABNORMAL HIGH (ref 83.0–108.0)
pO2, Arterial: 310 mmHg — ABNORMAL HIGH (ref 83.0–108.0)

## 2021-02-18 LAB — POCT ACTIVATED CLOTTING TIME
Activated Clotting Time: 132 seconds
Activated Clotting Time: 208 seconds
Activated Clotting Time: 225 seconds
Activated Clotting Time: 260 seconds
Activated Clotting Time: 236 seconds
Activated Clotting Time: 237 seconds

## 2021-02-18 LAB — GLUCOSE, CAPILLARY
Glucose-Capillary: 109 mg/dL — ABNORMAL HIGH (ref 70–99)
Glucose-Capillary: 150 mg/dL — ABNORMAL HIGH (ref 70–99)
Glucose-Capillary: 182 mg/dL — ABNORMAL HIGH (ref 70–99)
Glucose-Capillary: 197 mg/dL — ABNORMAL HIGH (ref 70–99)
Glucose-Capillary: 176 mg/dL — ABNORMAL HIGH (ref 70–99)

## 2021-02-18 LAB — CBC
HCT: 28.5 % — ABNORMAL LOW (ref 39.0–52.0)
HCT: 29.4 % — ABNORMAL LOW (ref 39.0–52.0)
Hemoglobin: 9.6 g/dL — ABNORMAL LOW (ref 13.0–17.0)
Hemoglobin: 9.9 g/dL — ABNORMAL LOW (ref 13.0–17.0)
MCH: 29.3 pg (ref 26.0–34.0)
MCH: 28.8 pg (ref 26.0–34.0)
MCHC: 33.7 g/dL (ref 30.0–36.0)
MCHC: 33.7 g/dL (ref 30.0–36.0)
MCV: 86.9 fL (ref 80.0–100.0)
MCV: 85.5 fL (ref 80.0–100.0)
Platelets: 264 10*3/uL (ref 150–400)
Platelets: 271 10*3/uL (ref 150–400)
RBC: 3.28 MIL/uL — ABNORMAL LOW (ref 4.22–5.81)
RBC: 3.44 MIL/uL — ABNORMAL LOW (ref 4.22–5.81)
RDW: 13 % (ref 11.5–15.5)
RDW: 13 % (ref 11.5–15.5)
WBC: 8.4 10*3/uL (ref 4.0–10.5)
WBC: 10.6 10*3/uL — ABNORMAL HIGH (ref 4.0–10.5)
nRBC: 0 % (ref 0.0–0.2)
nRBC: 0 % (ref 0.0–0.2)

## 2021-02-18 LAB — BLOOD GAS, ARTERIAL
Acid-base deficit: 2.3 mmol/L — ABNORMAL HIGH (ref 0.0–2.0)
Bicarbonate: 22.7 mmol/L (ref 20.0–28.0)
FIO2: 36
O2 Saturation: 99 %
Patient temperature: 36.5
pCO2 arterial: 43.1 mmHg (ref 32.0–48.0)
pH, Arterial: 7.339 — ABNORMAL LOW (ref 7.350–7.450)
pO2, Arterial: 144 mmHg — ABNORMAL HIGH (ref 83.0–108.0)

## 2021-02-18 LAB — APTT: aPTT: 39 seconds — ABNORMAL HIGH (ref 24–36)

## 2021-02-18 LAB — BASIC METABOLIC PANEL
Anion gap: 9 (ref 5–15)
Anion gap: 8 (ref 5–15)
BUN: 14 mg/dL (ref 6–20)
BUN: 13 mg/dL (ref 6–20)
CO2: 21 mmol/L — ABNORMAL LOW (ref 22–32)
CO2: 21 mmol/L — ABNORMAL LOW (ref 22–32)
Calcium: 8 mg/dL — ABNORMAL LOW (ref 8.9–10.3)
Calcium: 8 mg/dL — ABNORMAL LOW (ref 8.9–10.3)
Chloride: 104 mmol/L (ref 98–111)
Chloride: 105 mmol/L (ref 98–111)
Creatinine, Ser: 1 mg/dL (ref 0.61–1.24)
Creatinine, Ser: 1.01 mg/dL (ref 0.61–1.24)
GFR, Estimated: >60 mL/min (ref >60)
GFR, Estimated: >60 mL/min (ref >60)
Glucose, Bld: 209 mg/dL — ABNORMAL HIGH (ref 70–99)
Glucose, Bld: 179 mg/dL — ABNORMAL HIGH (ref 70–99)
Potassium: 3.8 mmol/L (ref 3.5–5.1)
Potassium: 4 mmol/L (ref 3.5–5.1)
Sodium: 134 mmol/L — ABNORMAL LOW (ref 135–145)
Sodium: 134 mmol/L — ABNORMAL LOW (ref 135–145)

## 2021-02-18 LAB — PROTIME-INR
INR: 1.1 (ref 0.8–1.2)
Prothrombin Time: 14.5 seconds (ref 11.4–15.2)

## 2021-02-18 LAB — PREPARE RBC (CROSSMATCH)

## 2021-02-18 LAB — MAGNESIUM
Magnesium: 1.3 mg/dL — ABNORMAL LOW (ref 1.7–2.4)
Magnesium: 1.2 mg/dL — ABNORMAL LOW (ref 1.7–2.4)

## 2021-02-18 LAB — ABO/RH: ABO/RH(D): A POS

## 2021-02-18 SURGERY — CREATION, BYPASS, ARTERIAL, AORTA TO FEMORAL, BILATERAL, USING GRAFT
Anesthesia: General | Site: Abdomen

## 2021-02-18 MED ORDER — ONDANSETRON HCL 4 MG/2ML IJ SOLN
4.0000 mg | Freq: Four times a day (QID) | INTRAMUSCULAR | Status: DC | PRN
  Administered 2021-02-19: 4 mg via INTRAVENOUS
  Filled 2021-02-18 (×2): qty 2

## 2021-02-18 MED ORDER — LABETALOL HCL 5 MG/ML IV SOLN
INTRAVENOUS | Status: DC | PRN
  Administered 2021-02-18: 5 mg via INTRAVENOUS

## 2021-02-18 MED ORDER — PHENYLEPHRINE 40 MCG/ML (10ML) SYRINGE FOR IV PUSH (FOR BLOOD PRESSURE SUPPORT)
PREFILLED_SYRINGE | INTRAVENOUS | Status: DC | PRN
  Administered 2021-02-18 (×3): 80 ug via INTRAVENOUS

## 2021-02-18 MED ORDER — INSULIN ASPART 100 UNIT/ML IJ SOLN
INTRAMUSCULAR | Status: AC
  Filled 2021-02-18: qty 1

## 2021-02-18 MED ORDER — CHLORHEXIDINE GLUCONATE 0.12 % MT SOLN
OROMUCOSAL | Status: AC
  Administered 2021-02-18: 07:00:00 15 mL via OROMUCOSAL
  Filled 2021-02-18: qty 15

## 2021-02-18 MED ORDER — FENTANYL CITRATE (PF) 250 MCG/5ML IJ SOLN
INTRAMUSCULAR | Status: DC | PRN
  Administered 2021-02-18: 50 ug via INTRAVENOUS
  Administered 2021-02-18 (×3): 25 ug via INTRAVENOUS
  Administered 2021-02-18: 100 ug via INTRAVENOUS
  Administered 2021-02-18 (×3): 50 ug via INTRAVENOUS

## 2021-02-18 MED ORDER — FENTANYL CITRATE (PF) 250 MCG/5ML IJ SOLN
INTRAMUSCULAR | Status: AC
  Filled 2021-02-18: qty 5

## 2021-02-18 MED ORDER — ACETAMINOPHEN 10 MG/ML IV SOLN
INTRAVENOUS | Status: AC
  Filled 2021-02-18: qty 100

## 2021-02-18 MED ORDER — HEPARIN SODIUM (PORCINE) 1000 UNIT/ML IJ SOLN
INTRAMUSCULAR | Status: AC
  Filled 2021-02-18: qty 1

## 2021-02-18 MED ORDER — CEFAZOLIN SODIUM-DEXTROSE 2-4 GM/100ML-% IV SOLN
INTRAVENOUS | Status: AC
  Filled 2021-02-18: qty 100

## 2021-02-18 MED ORDER — OXYCODONE HCL 5 MG PO TABS: 5.0000 mg | ORAL_TABLET | Freq: Once | ORAL | Status: DC | PRN

## 2021-02-18 MED ORDER — LIDOCAINE 2% (20 MG/ML) 5 ML SYRINGE
INTRAMUSCULAR | Status: DC | PRN
  Administered 2021-02-18: 100 mg via INTRAVENOUS

## 2021-02-18 MED ORDER — SODIUM CHLORIDE 0.9 % IV SOLN
10.0000 mL/h | Freq: Once | INTRAVENOUS | Status: AC
  Administered 2021-02-18: 19:00:00 10 mL/h via INTRAVENOUS

## 2021-02-18 MED ORDER — PROPOFOL 10 MG/ML IV BOLUS
INTRAVENOUS | Status: DC | PRN
  Administered 2021-02-18: 50 mg via INTRAVENOUS
  Administered 2021-02-18: 30 mg via INTRAVENOUS
  Administered 2021-02-18: 20 mg via INTRAVENOUS
  Administered 2021-02-18: 110 mg via INTRAVENOUS

## 2021-02-18 MED ORDER — ROCURONIUM BROMIDE 10 MG/ML (PF) SYRINGE
PREFILLED_SYRINGE | INTRAVENOUS | Status: AC
  Filled 2021-02-18: qty 10

## 2021-02-18 MED ORDER — HYDRALAZINE HCL 20 MG/ML IJ SOLN
5.0000 mg | INTRAMUSCULAR | Status: AC | PRN
  Administered 2021-02-20: 5 mg via INTRAVENOUS
  Filled 2021-02-18 (×3): qty 1

## 2021-02-18 MED ORDER — HYDROMORPHONE HCL 1 MG/ML IJ SOLN
INTRAMUSCULAR | Status: AC
  Filled 2021-02-18: qty 1

## 2021-02-18 MED ORDER — POTASSIUM CHLORIDE IN NACL 20-0.9 MEQ/L-% IV SOLN
INTRAVENOUS | Status: DC
  Administered 2021-02-19: 1000 mL via INTRAVENOUS
  Filled 2021-02-18 (×8): qty 1000

## 2021-02-18 MED ORDER — SODIUM CHLORIDE 0.9 % IV SOLN: 500.0000 mL | Freq: Once | INTRAVENOUS | Status: DC | PRN

## 2021-02-18 MED ORDER — ACETAMINOPHEN 325 MG RE SUPP
325.0000 mg | RECTAL | Status: DC | PRN
  Filled 2021-02-18: qty 2

## 2021-02-18 MED ORDER — HEPARIN 6000 UNIT IRRIGATION SOLUTION
Status: DC | PRN
  Administered 2021-02-18: 1

## 2021-02-18 MED ORDER — OXYCODONE HCL 5 MG/5ML PO SOLN: 5.0000 mg | Freq: Once | ORAL | Status: DC | PRN

## 2021-02-18 MED ORDER — AMISULPRIDE (ANTIEMETIC) 5 MG/2ML IV SOLN: 10.0000 mg | Freq: Once | INTRAVENOUS | Status: DC | PRN

## 2021-02-18 MED ORDER — PROPOFOL 10 MG/ML IV BOLUS
INTRAVENOUS | Status: AC
  Filled 2021-02-18: qty 20

## 2021-02-18 MED ORDER — CHLORHEXIDINE GLUCONATE CLOTH 2 % EX PADS: 6.0000 | MEDICATED_PAD | Freq: Once | CUTANEOUS | Status: DC

## 2021-02-18 MED ORDER — DIPHENHYDRAMINE HCL 50 MG/ML IJ SOLN: 12.5000 mg | Freq: Four times a day (QID) | INTRAMUSCULAR | Status: DC | PRN

## 2021-02-18 MED ORDER — ONDANSETRON HCL 4 MG/2ML IJ SOLN
INTRAMUSCULAR | Status: DC | PRN
  Administered 2021-02-18: 4 mg via INTRAVENOUS

## 2021-02-18 MED ORDER — ONDANSETRON HCL 4 MG/2ML IJ SOLN
INTRAMUSCULAR | Status: AC
  Filled 2021-02-18: qty 2

## 2021-02-18 MED ORDER — HYDROMORPHONE HCL 1 MG/ML IJ SOLN
0.2500 mg | INTRAMUSCULAR | Status: DC | PRN
  Administered 2021-02-18 (×2): 0.5 mg via INTRAVENOUS

## 2021-02-18 MED ORDER — HEPARIN 6000 UNIT IRRIGATION SOLUTION
Status: AC
  Filled 2021-02-18: qty 500

## 2021-02-18 MED ORDER — CHLORHEXIDINE GLUCONATE CLOTH 2 % EX PADS
6.0000 | MEDICATED_PAD | Freq: Every day | CUTANEOUS | Status: DC
  Administered 2021-02-20 – 2021-02-25 (×5): 6 via TOPICAL

## 2021-02-18 MED ORDER — HYDRALAZINE HCL 20 MG/ML IJ SOLN
INTRAMUSCULAR | Status: AC
  Administered 2021-02-18: 16:00:00 5 mg via INTRAVENOUS
  Filled 2021-02-18: qty 1

## 2021-02-18 MED ORDER — LIDOCAINE 2% (20 MG/ML) 5 ML SYRINGE
INTRAMUSCULAR | Status: AC
  Filled 2021-02-18: qty 5

## 2021-02-18 MED ORDER — DIPHENHYDRAMINE HCL 50 MG/ML IJ SOLN
INTRAMUSCULAR | Status: DC | PRN
  Administered 2021-02-18: 12.5 mg via INTRAVENOUS

## 2021-02-18 MED ORDER — INSULIN ASPART 100 UNIT/ML IJ SOLN
0.0000 [IU] | Freq: Three times a day (TID) | INTRAMUSCULAR | Status: DC
  Administered 2021-02-18 (×2): 2 [IU] via SUBCUTANEOUS
  Administered 2021-02-20: 1 [IU] via SUBCUTANEOUS
  Administered 2021-02-20 – 2021-02-21 (×4): 2 [IU] via SUBCUTANEOUS
  Administered 2021-02-21: 1 [IU] via SUBCUTANEOUS
  Administered 2021-02-22 (×2): 2 [IU] via SUBCUTANEOUS
  Administered 2021-02-23 (×2): 3 [IU] via SUBCUTANEOUS
  Administered 2021-02-24: 2 [IU] via SUBCUTANEOUS
  Administered 2021-02-24: 3 [IU] via SUBCUTANEOUS
  Administered 2021-02-24: 1 [IU] via SUBCUTANEOUS
  Administered 2021-02-25: 3 [IU] via SUBCUTANEOUS
  Filled 2021-02-18: qty 0.09

## 2021-02-18 MED ORDER — ROCURONIUM BROMIDE 10 MG/ML (PF) SYRINGE
PREFILLED_SYRINGE | INTRAVENOUS | Status: DC | PRN
  Administered 2021-02-18: 50 mg via INTRAVENOUS
  Administered 2021-02-18: 100 mg via INTRAVENOUS
  Administered 2021-02-18: 50 mg via INTRAVENOUS

## 2021-02-18 MED ORDER — MIDAZOLAM HCL 2 MG/2ML IJ SOLN
INTRAMUSCULAR | Status: DC | PRN
  Administered 2021-02-18: 2 mg via INTRAVENOUS

## 2021-02-18 MED ORDER — CHLORHEXIDINE GLUCONATE 0.12 % MT SOLN: 15.0000 mL | Freq: Once | OROMUCOSAL | Status: AC

## 2021-02-18 MED ORDER — ACETAMINOPHEN 10 MG/ML IV SOLN
1000.0000 mg | Freq: Once | INTRAVENOUS | Status: DC | PRN
  Administered 2021-02-18: 14:00:00 1000 mg via INTRAVENOUS

## 2021-02-18 MED ORDER — SODIUM CHLORIDE 0.9% FLUSH: 9.0000 mL | INTRAVENOUS | Status: DC | PRN

## 2021-02-18 MED ORDER — METOPROLOL TARTRATE 5 MG/5ML IV SOLN: 2.0000 mg | INTRAVENOUS | Status: DC | PRN

## 2021-02-18 MED ORDER — SODIUM CHLORIDE 0.9 % IV SOLN: INTRAVENOUS | Status: DC

## 2021-02-18 MED ORDER — ORAL CARE MOUTH RINSE: 15.0000 mL | Freq: Once | OROMUCOSAL | Status: AC

## 2021-02-18 MED ORDER — SUGAMMADEX SODIUM 200 MG/2ML IV SOLN
INTRAVENOUS | Status: DC | PRN
  Administered 2021-02-18: 200 mg via INTRAVENOUS

## 2021-02-18 MED ORDER — ALUM & MAG HYDROXIDE-SIMETH 200-200-20 MG/5ML PO SUSP: 15.0000 mL | ORAL | Status: DC | PRN

## 2021-02-18 MED ORDER — CEFAZOLIN SODIUM-DEXTROSE 2-4 GM/100ML-% IV SOLN
2.0000 g | INTRAVENOUS | Status: AC
  Administered 2021-02-18 (×2): 2 g via INTRAVENOUS

## 2021-02-18 MED ORDER — CEFAZOLIN SODIUM-DEXTROSE 2-4 GM/100ML-% IV SOLN
2.0000 g | Freq: Three times a day (TID) | INTRAVENOUS | Status: AC
  Administered 2021-02-18 – 2021-02-19 (×2): 2 g via INTRAVENOUS
  Filled 2021-02-18 (×2): qty 100

## 2021-02-18 MED ORDER — LACTATED RINGERS IV SOLN: INTRAVENOUS | Status: DC

## 2021-02-18 MED ORDER — 0.9 % SODIUM CHLORIDE (POUR BTL) OPTIME
TOPICAL | Status: DC | PRN
  Administered 2021-02-18: 09:00:00 2000 mL
  Administered 2021-02-18: 09:00:00 1000 mL

## 2021-02-18 MED ORDER — HEMOSTATIC AGENTS (NO CHARGE) OPTIME
TOPICAL | Status: DC | PRN
  Administered 2021-02-18 (×3): 1 via TOPICAL

## 2021-02-18 MED ORDER — HEPARIN SODIUM (PORCINE) 5000 UNIT/ML IJ SOLN
5000.0000 [IU] | Freq: Three times a day (TID) | INTRAMUSCULAR | Status: DC
  Administered 2021-02-19 – 2021-02-25 (×19): 5000 [IU] via SUBCUTANEOUS
  Filled 2021-02-18 (×19): qty 1

## 2021-02-18 MED ORDER — HEPARIN SODIUM (PORCINE) 1000 UNIT/ML IJ SOLN
INTRAMUSCULAR | Status: DC | PRN
  Administered 2021-02-18 (×2): 3000 [IU] via INTRAVENOUS
  Administered 2021-02-18 (×2): 2000 [IU] via INTRAVENOUS
  Administered 2021-02-18: 7000 [IU] via INTRAVENOUS

## 2021-02-18 MED ORDER — PANTOPRAZOLE SODIUM 40 MG IV SOLR
40.0000 mg | INTRAVENOUS | Status: DC
  Administered 2021-02-18 – 2021-02-22 (×5): 40 mg via INTRAVENOUS
  Filled 2021-02-18 (×5): qty 40

## 2021-02-18 MED ORDER — LABETALOL HCL 5 MG/ML IV SOLN
10.0000 mg | INTRAVENOUS | Status: AC | PRN
  Administered 2021-02-18 – 2021-02-19 (×4): 10 mg via INTRAVENOUS
  Filled 2021-02-18 (×4): qty 4

## 2021-02-18 MED ORDER — MIDAZOLAM HCL 2 MG/2ML IJ SOLN
INTRAMUSCULAR | Status: AC
  Filled 2021-02-18: qty 2

## 2021-02-18 MED ORDER — ACETAMINOPHEN 325 MG PO TABS
325.0000 mg | ORAL_TABLET | ORAL | Status: DC | PRN
  Administered 2021-02-24: 650 mg via ORAL
  Filled 2021-02-18: qty 2

## 2021-02-18 MED ORDER — ORAL CARE MOUTH RINSE
15.0000 mL | Freq: Two times a day (BID) | OROMUCOSAL | Status: DC
  Administered 2021-02-19 – 2021-02-23 (×9): 15 mL via OROMUCOSAL

## 2021-02-18 MED ORDER — NALOXONE HCL 0.4 MG/ML IJ SOLN: 0.4000 mg | INTRAMUSCULAR | Status: DC | PRN

## 2021-02-18 MED ORDER — ESMOLOL HCL 100 MG/10ML IV SOLN
INTRAVENOUS | Status: DC | PRN
  Administered 2021-02-18: 30 mg via INTRAVENOUS
  Administered 2021-02-18: 20 mg via INTRAVENOUS

## 2021-02-18 MED ORDER — DIPHENHYDRAMINE HCL 12.5 MG/5ML PO ELIX
12.5000 mg | ORAL_SOLUTION | Freq: Four times a day (QID) | ORAL | Status: DC | PRN
  Filled 2021-02-18: qty 5

## 2021-02-18 MED ORDER — ALBUMIN HUMAN 5 % IV SOLN: INTRAVENOUS | Status: DC | PRN

## 2021-02-18 MED ORDER — PROTAMINE SULFATE 10 MG/ML IV SOLN
INTRAVENOUS | Status: DC | PRN
  Administered 2021-02-18: 50 mg via INTRAVENOUS

## 2021-02-18 MED ORDER — MAGNESIUM SULFATE 2 GM/50ML IV SOLN
2.0000 g | Freq: Every day | INTRAVENOUS | Status: AC | PRN
  Administered 2021-02-18: 17:00:00 2 g via INTRAVENOUS
  Filled 2021-02-18: qty 50

## 2021-02-18 MED ORDER — HYDROMORPHONE 1 MG/ML IV SOLN
INTRAVENOUS | Status: DC
  Administered 2021-02-20: 3.9 mg via INTRAVENOUS
  Administered 2021-02-20: 1.8 mg via INTRAVENOUS
  Administered 2021-02-20: 0.9 mg via INTRAVENOUS
  Administered 2021-02-21: 1.2 mg via INTRAVENOUS

## 2021-02-18 MED ORDER — MORPHINE SULFATE (PF) 2 MG/ML IV SOLN: 2.0000 mg | INTRAVENOUS | Status: DC | PRN

## 2021-02-18 MED ORDER — PHENYLEPHRINE HCL-NACL 20-0.9 MG/250ML-% IV SOLN
INTRAVENOUS | Status: DC | PRN
  Administered 2021-02-18: 30 ug/min via INTRAVENOUS

## 2021-02-18 MED ORDER — SODIUM CHLORIDE 0.9 % IV SOLN: INTRAVENOUS | Status: DC | PRN

## 2021-02-18 MED ORDER — DEXMEDETOMIDINE (PRECEDEX) IN NS 20 MCG/5ML (4 MCG/ML) IV SYRINGE
PREFILLED_SYRINGE | INTRAVENOUS | Status: DC | PRN
  Administered 2021-02-18: 8 ug via INTRAVENOUS

## 2021-02-18 MED ORDER — ONDANSETRON HCL 4 MG/2ML IJ SOLN: 4.0000 mg | Freq: Once | INTRAMUSCULAR | Status: DC | PRN

## 2021-02-18 MED ORDER — ESMOLOL HCL 100 MG/10ML IV SOLN
INTRAVENOUS | Status: AC
  Filled 2021-02-18: qty 10

## 2021-02-18 MED ORDER — POTASSIUM CHLORIDE CRYS ER 20 MEQ PO TBCR: 20.0000 meq | EXTENDED_RELEASE_TABLET | Freq: Every day | ORAL | Status: DC | PRN

## 2021-02-18 MED ORDER — LACTATED RINGERS IV SOLN: INTRAVENOUS | Status: DC | PRN

## 2021-02-18 MED ORDER — DEXMEDETOMIDINE (PRECEDEX) IN NS 20 MCG/5ML (4 MCG/ML) IV SYRINGE
PREFILLED_SYRINGE | INTRAVENOUS | Status: AC
  Filled 2021-02-18: qty 5

## 2021-02-18 SURGICAL SUPPLY — 56 items
BAG COUNTER SPONGE SURGICOUNT (BAG) IMPLANT
BAG ISOLATION DRAPE 18X18 (DRAPES) ×2 IMPLANT
CANISTER SUCT 3000ML PPV (MISCELLANEOUS) ×3 IMPLANT
CLIP VESOCCLUDE MED 24/CT (CLIP) ×3 IMPLANT
CLIP VESOCCLUDE SM WIDE 24/CT (CLIP) ×3 IMPLANT
DERMABOND ADHESIVE PROPEN (GAUZE/BANDAGES/DRESSINGS) ×3
DERMABOND ADVANCED (GAUZE/BANDAGES/DRESSINGS) ×3
DERMABOND ADVANCED .7 DNX12 (GAUZE/BANDAGES/DRESSINGS) ×6 IMPLANT
DERMABOND ADVANCED .7 DNX6 (GAUZE/BANDAGES/DRESSINGS) ×6 IMPLANT
DRAPE ISOLATION BAG 18X18 (DRAPES) ×3
ELECT BLADE 4.0 EZ CLEAN MEGAD (MISCELLANEOUS)
ELECT BLADE 6.5 EXT (BLADE) ×3 IMPLANT
ELECT REM PT RETURN 9FT ADLT (ELECTROSURGICAL) ×3
ELECTRODE BLDE 4.0 EZ CLN MEGD (MISCELLANEOUS) IMPLANT
ELECTRODE REM PT RTRN 9FT ADLT (ELECTROSURGICAL) ×2 IMPLANT
FELT TEFLON 1X6 (MISCELLANEOUS) ×3 IMPLANT
GLOVE SRG 8 PF TXTR STRL LF DI (GLOVE) ×2 IMPLANT
GLOVE SURG ENC MOIS LTX SZ7.5 (GLOVE) ×6 IMPLANT
GLOVE SURG UNDER POLY LF SZ8 (GLOVE) ×3
GOWN STRL REUS W/ TWL LRG LVL3 (GOWN DISPOSABLE) ×4 IMPLANT
GOWN STRL REUS W/ TWL XL LVL3 (GOWN DISPOSABLE) ×2 IMPLANT
GOWN STRL REUS W/TWL 2XL LVL3 (GOWN DISPOSABLE) ×3 IMPLANT
GOWN STRL REUS W/TWL LRG LVL3 (GOWN DISPOSABLE) ×6
GOWN STRL REUS W/TWL XL LVL3 (GOWN DISPOSABLE) ×3
GRAFT HEMASHIELD 16X8MM (Vascular Products) ×3 IMPLANT
HEMOSTAT SNOW SURGICEL 2X4 (HEMOSTASIS) ×9 IMPLANT
INSERT FOGARTY 61MM (MISCELLANEOUS) ×6 IMPLANT
INSERT FOGARTY SM (MISCELLANEOUS) ×12 IMPLANT
KIT BASIN OR (CUSTOM PROCEDURE TRAY) ×3 IMPLANT
KIT TURNOVER KIT B (KITS) ×3 IMPLANT
LOOP VESSEL MINI RED (MISCELLANEOUS) ×12 IMPLANT
NS IRRIG 1000ML POUR BTL (IV SOLUTION) ×9 IMPLANT
PACK AORTA (CUSTOM PROCEDURE TRAY) ×3 IMPLANT
PAD ARMBOARD 7.5X6 YLW CONV (MISCELLANEOUS) ×6 IMPLANT
RETAINER VISCERA MED (MISCELLANEOUS) ×3 IMPLANT
SPONGE T-LAP 18X18 ~~LOC~~+RFID (SPONGE) ×3 IMPLANT
SUT ETHIBOND 5 LR DA (SUTURE) IMPLANT
SUT MNCRL AB 4-0 PS2 18 (SUTURE) ×12 IMPLANT
SUT PDS AB 1 TP1 96 (SUTURE) ×9 IMPLANT
SUT PROLENE 3 0 SH1 36 (SUTURE) ×18 IMPLANT
SUT PROLENE 5 0 C 1 24 (SUTURE) ×6 IMPLANT
SUT PROLENE 5 0 C 1 36 (SUTURE) IMPLANT
SUT SILK 2 0 (SUTURE) ×3
SUT SILK 2 0 SH CR/8 (SUTURE) ×3 IMPLANT
SUT SILK 2-0 18XBRD TIE 12 (SUTURE) ×2 IMPLANT
SUT SILK 4 0 (SUTURE) ×3
SUT SILK 4-0 18XBRD TIE 12 (SUTURE) ×2 IMPLANT
SUT VIC AB 2-0 CT1 27 (SUTURE) ×12
SUT VIC AB 2-0 CT1 TAPERPNT 27 (SUTURE) ×8 IMPLANT
SUT VIC AB 3-0 SH 27 (SUTURE) ×21
SUT VIC AB 3-0 SH 27X BRD (SUTURE) ×14 IMPLANT
TAPE UMBILICAL 1/8X18 (MISCELLANEOUS) ×3 IMPLANT
TOWEL GREEN STERILE (TOWEL DISPOSABLE) ×6 IMPLANT
TOWEL ~~LOC~~+RFID 17X26 BLUE (SPONGE) ×3 IMPLANT
TRAY FOLEY MTR SLVR 16FR STAT (SET/KITS/TRAYS/PACK) ×3 IMPLANT
WATER STERILE IRR 1000ML POUR (IV SOLUTION) ×6 IMPLANT

## 2021-02-18 NOTE — Transfer of Care (Signed)
Immediate Anesthesia Transfer of Care Note  Patient: Dylan Anderson  Procedure(s) Performed: AORTOBIFEMORAL BYPASS GRAFT USING 16 x 17mm HEMASHIELD GOLD (Bilateral: Abdomen) APPLICATION OF CELL SAVER (Abdomen)  Patient Location: PACU  Anesthesia Type:General  Level of Consciousness: drowsy  Airway & Oxygen Therapy: Patient Spontanous Breathing and Patient connected to face mask oxygen  Post-op Assessment: Report given to RN and Post -op Vital signs reviewed and stable  Post vital signs: Reviewed and stable  Last Vitals:  Vitals Value Taken Time  BP 131/74 02/18/21 1250  Temp    Pulse 83 02/18/21 1251  Resp 16 02/18/21 1251  SpO2 100 % 02/18/21 1251  Vitals shown include unvalidated device data.  Last Pain:  Vitals:   02/18/21 0701  TempSrc:   PainSc: 6       Patients Stated Pain Goal: 2 (02/18/21 0701)  Complications: No notable events documented.

## 2021-02-18 NOTE — H&P (Signed)
History and Physical Interval Note:  02/18/2021 7:32 AM  Dylan Anderson  has presented today for surgery, with the diagnosis of PAOD.  The various methods of treatment have been discussed with the patient and family. After consideration of risks, benefits and other options for treatment, the patient has consented to  Procedure(s): AORTOBIFEMORAL BYPASS GRAFT (Bilateral) as a surgical intervention.  The patient's history has been reviewed, patient examined, no change in status, stable for surgery.  I have reviewed the patient's chart and labs.  Questions were answered to the patient's satisfaction.    Aortobifemoral bypass.  CLI with critical limb ischemia and tissue loss.  Risk benefits again discussed.  He has been cleared by cardiology after Lexiscan.  I have discussed his hemoglobin A1c being elevated and high risk for postop wound issues but he does not feel he can wait given the severity of his symptoms.  Cephus Shelling  Patient name: Dylan Anderson       MRN: [id number]        DOB: 10-10-1960        Sex: male   REASON FOR VISIT: Follow-up after CTA for occluded iliac stents   HPI: Dylan Anderson is a 60 y.o. male with history of hypertension, hyperlipidemia, diabetes, coronary artery disease, peripheral vascular disease that presents for evaluation after CTA.  He was recently seen in the [facility] clinic with evidence of occluded bilateral common iliac stents and was sent for CTA.  He initially had a left common iliac stent at [facility] about 8 or 9 years ago.  Most recently I performed bilateral common iliac stenting on 06/18/2020.  He has been having right leg rest pain for 4-5 months.  He has severe rest pain in both lower extremities now with tissue loss on the right great toe.  We sent hi for CTA to evaluate for aortobifemoral bypass.  He is also getting clearance from Dr. Dulce Sellar and has a myocardial perfusion study tomorrow for ischemic evaluation.       Past Medical History:  Diagnosis Date    Acute gout involving toe of left foot 09/21/2017   Arthritis     Back pain     Chest pain in adult 05/26/2018   Chronic cough 07/13/2017   Claudication (HCC)     Diabetes mellitus type II, non insulin dependent (HCC) 07/10/2012   Erectile dysfunction     Gastroesophageal reflux disease 04/27/2017   Hand pain 03/18/2017   Healthcare maintenance 07/12/2017   Hip pain 05/20/2017   Hyperlipidemia 01/25/2017   Hypertension 07/10/2012   Insomnia 02/06/2017   Low back pain 01/25/2017   Peripheral artery disease (HCC) 06/07/2017   PVD (peripheral vascular disease) (HCC)     Tobacco abuse 07/10/2012   Type 2 diabetes mellitus (HCC)             Past Surgical History:  Procedure Laterality Date   ABDOMINAL AORTOGRAM W/LOWER EXTREMITY N/A 06/18/2020    Procedure: ABDOMINAL AORTOGRAM W/LOWER EXTREMITY;  Surgeon: Cephus Shelling, MD;  Location: [facility] INVASIVE CV LAB;  Service: Cardiovascular;  Laterality: N/A;   ILIAC ARTERY STENT       LEFT HEART CATH AND CORONARY ANGIOGRAPHY N/A 06/19/2018    Procedure: LEFT HEART CATH AND CORONARY ANGIOGRAPHY;  Surgeon: Lennette Bihari, MD;  Location: [facility] INVASIVE CV LAB;  Service: Cardiovascular;  Laterality: N/A;   PERIPHERAL VASCULAR INTERVENTION Bilateral 06/18/2020    Procedure: PERIPHERAL VASCULAR INTERVENTION;  Surgeon: Cephus Shelling, MD;  Location: [facility] INVASIVE  CV LAB;  Service: Cardiovascular;  Laterality: Bilateral;  common Iliac   stent Left      left common and left internal illiac stent           Family History  Problem Relation Age of Onset   Heart failure Mother     Osteoarthritis Mother     COPD Sister     Stroke Sister     Heart attack Maternal Aunt     Heart attack Maternal Uncle        SOCIAL HISTORY: Social History         Tobacco Use   Smoking status: Former      Packs/day: 0.50      Years: 17.00      Pack years: 8.50      Types: Cigarettes      Start date: 04/04/1978      Quit date: 01/2021      Years since quitting: 0.1    Smokeless tobacco: Never   Tobacco comments:      Max 1 ppd  Substance Use Topics   Alcohol use: Yes      Comment: occ      No Known Allergies         Current Outpatient Medications  Medication Sig Dispense Refill   amLODipine (NORVASC) 5 MG tablet TAKE 1 TABLET (5 MG TOTAL) BY MOUTH DAILY. 30 tablet 3   aspirin 81 MG EC tablet TAKE 1 TABLET (81 MG TOTAL) BY MOUTH DAILY. 120 tablet 0   atorvastatin (LIPITOR) 80 MG tablet TAKE 1 TABLET (80 MG TOTAL) BY MOUTH DAILY AT 6 PM. 90 tablet 0   clopidogrel (PLAVIX) 75 MG tablet Take 1 tablet (75 mg total) by mouth daily. 30 tablet 11   fenofibrate (TRICOR) 48 MG tablet Take 1 tablet (48 mg total) by mouth daily. 90 tablet 0   glucose blood (TRUE METRIX BLOOD GLUCOSE TEST) test strip Use as instructed. Check blood glucose level by fingerstick three times per day. E11.65 200 each 6   insulin aspart (NOVOLOG) 100 UNIT/ML FlexPen INJECT 10 UNITS INTO THE SKIN 2 (TWO) TIMES DAILY WITH A MEAL. 6 mL 6   insulin glargine (LANTUS SOLOSTAR) 100 UNIT/ML Solostar Pen Inject 70 Units into the skin daily. 15 mL 0   Insulin Pen Needle 31G X 8 MM MISC USE AS DIRECTED WITH INSULIN 100 each 2   liraglutide (VICTOZA) 18 MG/3ML SOPN START 0.6MG  INTO THE SKIN DAILY FOR 7 DAYS,THEN 1.2MG  DAILY X7 DAYS, THEN 1.8MG  DAILY THEREAFTER. 6 mL 1   metFORMIN (GLUCOPHAGE) 1000 MG tablet TAKE 1 TABLET (1,000 MG TOTAL) BY MOUTH 2 (TWO) TIMES DAILY WITH A MEAL. 180 tablet 0   metoprolol succinate (TOPROL-XL) 50 MG 24 hr tablet TAKE 1 TABLET (50 MG TOTAL) BY MOUTH AT BEDTIME. 30 tablet 2   nitroGLYCERIN (NITROSTAT) 0.4 MG SL tablet PLACE 1 TABLET (0.4 MG TOTAL) UNDER THE TONGUE EVERY 5 (FIVE) MINUTES AS NEEDED FOR CHEST PAIN. 90 tablet 3   pantoprazole (PROTONIX) 40 MG tablet TAKE 1 TABLET (40 MG TOTAL) BY MOUTH DAILY. 90 tablet 0   pregabalin (LYRICA) 100 MG capsule Take 100 mg by mouth 2 (two) times daily.       tamsulosin (FLOMAX) 0.4 MG CAPS capsule TAKE 1 CAPSULE (0.4 MG TOTAL)  BY MOUTH DAILY. 30 capsule 0   TRUEplus Lancets 28G MISC Use as instructed. Check blood glucose level by fingerstick 3 times per day. E11.65 200 each 6   buPROPion (  WELLBUTRIN SR) 200 MG 12 hr tablet TAKE 1 TABLET (200 MG TOTAL) BY MOUTH 2 (TWO) TIMES DAILY. 180 tablet 0   pregabalin (LYRICA) 100 MG capsule TAKE 1 CAPSULE (100 MG TOTAL) BY MOUTH 3 (THREE) TIMES DAILY. 90 capsule 2    No current facility-administered medications for this visit.      REVIEW OF SYSTEMS:  [X]  denotes positive finding, [ ]  denotes negative finding Cardiac   Comments:  Chest pain or chest pressure:      Shortness of breath upon exertion:      Short of breath when lying flat:      Irregular heart rhythm:             Vascular      Pain in calf, thigh, or hip brought on by ambulation:      Pain in feet at night that wakes you up from your sleep:  x Bilateral  Blood clot in your veins:      Leg swelling:              Pulmonary      Oxygen at home:      Productive cough:       Wheezing:              Neurologic      Sudden weakness in arms or legs:       Sudden numbness in arms or legs:       Sudden onset of difficulty speaking or slurred speech:      Temporary loss of vision in one eye:       Problems with dizziness:              Gastrointestinal      Blood in stool:       Vomited blood:              Genitourinary      Burning when urinating:       Blood in urine:             Psychiatric      Major depression:              Hematologic      Bleeding problems:      Problems with blood clotting too easily:             Skin      Rashes or ulcers:             Constitutional      Fever or chills:          PHYSICAL EXAM:    Vitals:    02/09/21 0937  BP: 128/72  Pulse: 80  Resp: 16  Temp: 98 F (36.7 C)  TempSrc: Temporal  SpO2: 98%  Weight: 140 lb (63.5 kg)  Height: 5\' 9"  (1.753 m)      GENERAL: The patient is a well-nourished male, in no acute distress. The vital signs are  documented above. CARDIAC: There is a regular rate and rhythm.  VASCULAR:  No palpable femoral pulses Right great toe tissue loss PULMONARY: No respiratory distress. ABDOMEN: Soft and non-tender. MUSCULOSKELETAL: There are no major deformities or cyanosis. NEUROLOGIC: No focal weakness or paresthesias are detected. SKIN: There are no ulcers or rashes noted. PSYCHIATRIC: The patient has a normal affect.   DATA:    CTA reviewed from 02/03/2021 shows evidence of occluded distal abdominal aorta as well as occluded bilateral iliac stents.  He does reconstitute distal external  iliac arteries bilaterally.  He does have some SFA disease on the right.   Assessment/Plan:     60 year old male presents with critical limb ischemia including bilateral lower extremity rest pain and right lower extremity tissue loss with occluded common iliac stents.  These were read to iliac stents given initiate iliac stenting at [facility] about 8 to 9 years ago.  I have recommended aortobifemoral bypass after review of his CTA from 02/03/21.  I discussed steps of the surgery including 7 to 10 days in the hospital.  Discussed 1 in 3 risk of major complication including bleeding, infection, renal failure, heart attack, stroke, risk of anesthesia etc. even death.  Ultimately he is very miserable and wants to proceed a soon as possible given the severity of his lower extremity symptoms.  I will post him for next Thursday 02/18/2021 for aortobifemoral bypass.  We will need to hold his Plavix for 7 days preop and he is currently undergoing cardiac clearance and getting myocardial perfusion study tomorrow for ischemic evaluation and followed by Dr. Dulce Sellar.  Discussed The only reason I could see his surgery being delayed as if Dr. Dulce Sellar feels he needs a cath after his Myoview.  All questions answered.  The surgery was discussed in detail.   Cephus Shelling, MD [facility] Office:  [phone]

## 2021-02-18 NOTE — Anesthesia Procedure Notes (Signed)
Procedure Name: Intubation Date/Time: 02/18/2021 7:54 AM Performed by: Erick Colace, CRNA Pre-anesthesia Checklist: Patient identified, Emergency Drugs available, Suction available and Patient being monitored Patient Re-evaluated:Patient Re-evaluated prior to induction Oxygen Delivery Method: Circle system utilized Preoxygenation: Pre-oxygenation with 100% oxygen Induction Type: IV induction Ventilation: Mask ventilation without difficulty and Oral airway inserted - appropriate to patient size Laryngoscope Size: Mac and 4 Grade View: Grade II Tube type: Oral Tube size: 7.5 mm Number of attempts: 1 Airway Equipment and Method: Stylet and Oral airway Placement Confirmation: ETT inserted through vocal cords under direct vision, positive ETCO2 and breath sounds checked- equal and bilateral Secured at: 22 cm Tube secured with: Tape Dental Injury: Teeth and Oropharynx as per pre-operative assessment

## 2021-02-18 NOTE — Progress Notes (Signed)
Pacu RN Report to floor given  Gave report to [facility]. [phone]. Discussed surgery, meds given in OR and Pacu, VS, IV fluids given, EBL, urine output, pain and other pertinent information. Also discussed if pt had any family or friends here or belongings with them.   Discussed bilat femoral incisions, stage 0, abdominal midline incision. Palpable DP pulses in bilat feet. Meds given discussed. Updated via text his wife.   Pt exits my care.

## 2021-02-18 NOTE — Anesthesia Procedure Notes (Signed)
Arterial Line Insertion Start/End11/17/2022 7:15 AM, 02/18/2021 7:20 AM  Patient location: Pre-op. Preanesthetic checklist: patient identified, IV checked, site marked, risks and benefits discussed, surgical consent, monitors and equipment checked, pre-op evaluation, timeout performed and anesthesia consent Lidocaine 1% used for infiltration Left, radial was placed Catheter size: 20 G Hand hygiene performed  and maximum sterile barriers used   Attempts: 1 Procedure performed without using ultrasound guided technique. Following insertion, dressing applied and Biopatch. Post procedure assessment: normal and unchanged

## 2021-02-18 NOTE — Anesthesia Postprocedure Evaluation (Signed)
Anesthesia Post Note  Patient: Dylan Anderson  Procedure(s) Performed: AORTOBIFEMORAL BYPASS GRAFT USING 16 x 35mm HEMASHIELD GOLD (Bilateral: Abdomen) APPLICATION OF CELL SAVER (Abdomen)     Patient location during evaluation: PACU Anesthesia Type: General Level of consciousness: awake and alert Pain management: pain level controlled Vital Signs Assessment: post-procedure vital signs reviewed and stable Respiratory status: spontaneous breathing, nonlabored ventilation, respiratory function stable and patient connected to nasal cannula oxygen Cardiovascular status: blood pressure returned to baseline and stable Postop Assessment: no apparent nausea or vomiting Anesthetic complications: no   No notable events documented.  Last Vitals:  Vitals:   02/18/21 1450 02/18/21 1455  BP: 131/74 133/75  Pulse: 98   Resp: 14 14  Temp:  36.6 C  SpO2: 98% 98%    Last Pain:  Vitals:   02/18/21 1455  TempSrc:   PainSc: 4                  Trevor Iha

## 2021-02-18 NOTE — Op Note (Signed)
Date: February 18, 2021  Preoperative diagnosis: Critical limb ischemia bilateral lower extremities with tissue loss  Postoperative diagnosis: Same  Procedure: Aortobifemoral bypass (16 mm x 8 mm bifurcated dacron graft)  Surgeon: Dr. Cephus Shelling, MD  Assistant: Dr. Tarry Kos, MD  Indications: Patient is a 60 year old male that was recently seen with occluded bilateral common iliac artery stents.  This was his second iliac iliac intervention and had a iliac artery intervention with stenting at [facility] about 9 years ago.  He has developed severe rest pain with a right great toe wound as well.  We subsequent recommended aortobifemoral bypass.  After cardiac clearance and CTA scan he presents today for aortobifemoral bypass after risk benefits discussed.  An assistant was needed for exposure and expedite the case.  Findings: Infrarenal clamp on the abdominal aorta and the proximal graft was sewn end to end proximally with a 16 mm x 8 mm bifurcated dacron graft.  The graft limbs were then tunneled under both ureters to both groin incisions which were done horizontally.  The graft was then sewn end to side to both common femoral arteries in each groin.  Excellent DP signals in both feet at completion.  Anesthesia: General  EBL: 300 mL  Details: Patient was taken to the operating room after informed consent was obtained.  Placed on the operative table in supine position.  Bilateral groins and abdominal wall were then prepped and draped in usual sterile fashion.  Antibiotics were given.  Timeout was performed.  I initially started in the right groin while Dr. Karin Lieu started in the left groin and we each made horizontal groin incisions above the inguinal crease.  Dissected down with Bovie cautery using cerebellar retractors for added visualization.  The femoral sheath was opened longitudinally.  Dissected out the external iliac artery as well as the circumflex branches and the  common femoral all the way down to the bifurcation of the SFA and profunda.  All the branches including the common femorals were controlled with Vesseloops.  Patient had a nice soft common femoral artery for anastomosis.  There were no crossing circumflex veins.  We then bluntly tunneled under the inguinal crease above the external iliac artery to creat tunnels.  We then performed a midline incision from the xiphoid to below the umbilicus in the midline.  Dissected down with Bovie cautery and the fascia was divided along the midline with Bovie cautery until we entered the peritoneal cavity.  We inspected all 4 quadrants as well as the colon and small bowel and everything appeared viable with no obvious abnormal findings.  The colon was then eviscerated cranial and covered with a wet towel.  I then eviscerated the small bowel in the right upper quadrant.  We went into the retroperitoneum and the retroperitoneum was opened between the duodenum and the IMV and we dissected down until we found the abdominal aorta.  This was subsequently dissected by opening up the retroperitoneum down to the aortic bifurcation.  We placed a fixed Omni retractor with a wet towel around the small bowel that had been eviscerated.  We dissected out the left renal vein as well as both renal arteries and found a nice clamp zone just below the renal arteries and I was able to dissect this out circumferentially and get a umbilical tape behind the aorta just below the renal arteries.  We did identify several lumbar branches that were controlled with medium clips.  The IMA was controlled with a  vessel loop.  At that point in time we then created tunnels bluntly with our finger on top of the iliac arteries to both groins and passed umbilical tapes using towel clamp for our tunnels.  We selected a 16 mm x 8 mm bifurcated dacron graft.  The patient was then given 100 units/kg IV heparin and we checked an ACT to maintain greater than 250.  We had to  give multiple additional doses of heparin until he got a therapeutic ACT.  Once his ACT was therapeutic I clamped above the IMA with a DeBakey aortic clamp and just below the renals with a Harken C-clamp.  The aorta was then divided in the infrarenal segment with scissors.  The distal aortic stump was oversewn with a 3-0 Prolene baseball stitch and then oversewn with a running stitch with good hemostasis.  We then brought the graft on the field with a felt strip and a end-to-end anastomosis was sewn to the infrarenal aorta with 3-0 Prolene parachute technique.  Once we came off clamp there was an area in the graft anteriorly that had to be repaired with several pledgeted sutures.  We then flushed both limbs and had excellent pulsatile inflow.  We then tunneled each limb to the common femoral artery in the groin through the previously created tunnels.  We started in the right groin and controlled the distal common femoral with a vessel loop and the distal external iliac with a Henley clamp.  The common femoral was opened with 11 blade scalpel extended with Potts scissors and the graft was cut to the appropriate length and a end to side anastomosis was sewn to the right common femoral artery.  We did de-air everything prior to completion.  This was done with 5-0 Prolene parachute technique.  Once we finished the right groin anastomosis we had an excellent pulse in the graft and then at the right common femoral artery.  We then did the same steps again in the left groin controlling the distal common femoral with a vessel loop in the distal external iliac with a Henley clamp.  The left common femoral artery was opened with 11 blade scalpel extended Potts scissors.  No endarterectomy was required.  The graft was then cut to the appropriate length and spatulated and end to side anastomosis was sewn to the left common femoral artery.  Everything was de-aired prior to completion.  We checked the feet and had excellent  signals in both dorsalis pedis.  50 mg protamine was given for reversal.  Peritoneal cavity was then irrigated.  We inspected the proximal anastomosis and had to put one additional repair stitch with 3-0 Prolene.  We had good hemostasis.  Surgicel snow was then packed around the proximal anastomosis.  I did listen to the IMA with Doppler and there was good Doppler flow in the IMA and the left colon and sigmoid colon all appeared viable so we elected not to reimplant the IMA. The retroperitoneum was run closed with a 3-0 Vicryl.  All the bowel appeared viable.    The midline fascia was then closed with #1 double-stranded PDS.  We then closed the subcutaneous tissue of midline incision with 3-0 Vicryl, 4-0 Monocryl, Dermabond.  Both groin incisions were then irrigated out and closed multiple layers of 2-0 Vicryl, 3-0 Vicryl, 4-0 Monocryl and Dermabond.  Again checked at the end of the case and he had excellent Doppler flow in both dorsalis pedis.  He remained stable and was extubated and taken  to the PACU in stable condition  Complication: None  Condition: Stable, ICU overnight  Cephus Shelling, MD [facility] Office: [phone]   Cephus Shelling

## 2021-02-19 ENCOUNTER — Encounter (HOSPITAL_COMMUNITY): Payer: Self-pay | Admitting: Vascular Surgery

## 2021-02-19 ENCOUNTER — Inpatient Hospital Stay (HOSPITAL_COMMUNITY): Payer: Medicaid Other

## 2021-02-19 DIAGNOSIS — I70234 Atherosclerosis of native arteries of right leg with ulceration of heel and midfoot: Secondary | ICD-10-CM

## 2021-02-19 DIAGNOSIS — I70235 Atherosclerosis of native arteries of right leg with ulceration of other part of foot: Secondary | ICD-10-CM

## 2021-02-19 DIAGNOSIS — Z9889 Other specified postprocedural states: Secondary | ICD-10-CM

## 2021-02-19 DIAGNOSIS — T82868A Thrombosis of vascular prosthetic devices, implants and grafts, initial encounter: Secondary | ICD-10-CM

## 2021-02-19 LAB — CBC
HCT: 29.3 % — ABNORMAL LOW (ref 39.0–52.0)
Hemoglobin: 9.7 g/dL — ABNORMAL LOW (ref 13.0–17.0)
MCH: 28.4 pg (ref 26.0–34.0)
MCHC: 33.1 g/dL (ref 30.0–36.0)
MCV: 85.7 fL (ref 80.0–100.0)
Platelets: 273 10*3/uL (ref 150–400)
RBC: 3.42 MIL/uL — ABNORMAL LOW (ref 4.22–5.81)
RDW: 13.3 % (ref 11.5–15.5)
WBC: 11.7 10*3/uL — ABNORMAL HIGH (ref 4.0–10.5)
nRBC: 0 % (ref 0.0–0.2)

## 2021-02-19 LAB — GLUCOSE, CAPILLARY
Glucose-Capillary: 104 mg/dL — ABNORMAL HIGH (ref 70–99)
Glucose-Capillary: 113 mg/dL — ABNORMAL HIGH (ref 70–99)
Glucose-Capillary: 116 mg/dL — ABNORMAL HIGH (ref 70–99)
Glucose-Capillary: 89 mg/dL (ref 70–99)
Glucose-Capillary: 97 mg/dL (ref 70–99)
Glucose-Capillary: 97 mg/dL (ref 70–99)

## 2021-02-19 LAB — COMPREHENSIVE METABOLIC PANEL
ALT: 13 U/L (ref 0–44)
AST: 17 U/L (ref 15–41)
Albumin: 2.4 g/dL — ABNORMAL LOW (ref 3.5–5.0)
Alkaline Phosphatase: 49 U/L (ref 38–126)
Anion gap: 5 (ref 5–15)
BUN: 18 mg/dL (ref 6–20)
CO2: 23 mmol/L (ref 22–32)
Calcium: 8 mg/dL — ABNORMAL LOW (ref 8.9–10.3)
Chloride: 112 mmol/L — ABNORMAL HIGH (ref 98–111)
Creatinine, Ser: 0.95 mg/dL (ref 0.61–1.24)
GFR, Estimated: >60 mL/min (ref >60)
Glucose, Bld: 99 mg/dL (ref 70–99)
Potassium: 4.4 mmol/L (ref 3.5–5.1)
Sodium: 140 mmol/L (ref 135–145)
Total Bilirubin: 0.6 mg/dL (ref 0.3–1.2)
Total Protein: 4.8 g/dL — ABNORMAL LOW (ref 6.5–8.1)

## 2021-02-19 LAB — AMYLASE: Amylase: 41 U/L (ref 28–100)

## 2021-02-19 LAB — MAGNESIUM: Magnesium: 1.8 mg/dL (ref 1.7–2.4)

## 2021-02-19 MED ORDER — LIDOCAINE 5 % EX PTCH
1.0000 | MEDICATED_PATCH | CUTANEOUS | Status: DC
  Administered 2021-02-19 – 2021-02-24 (×5): 1 via TRANSDERMAL
  Filled 2021-02-19 (×7): qty 1

## 2021-02-19 NOTE — Evaluation (Signed)
Physical Therapy Evaluation Patient Details Name: Dylan Anderson MRN: [id number] DOB: [birth year] Today's Date: 02/19/2021  History of Present Illness  The pt is a 60 yo male presenting 11/17 for aortobifemoral bypass due to critical limb ischemia and tissue loss. PMH includes: HTN, HLD, DM II, CAD, PVD, prior bilateral common iliac stents (8-9 years ago and again in 06/2020).   Clinical Impression  Pt in bed upon arrival of PT, agreeable to evaluation at this time. Prior to admission the pt reports he was mobilizing within his home with use of SPC most recently, but also reports he self-limits OOB mobility to ~4x/daily and has frequent falls when ambulating. Recently his ex-wife has moved back in with him to assist with IADLs. The pt now presents with limitations in functional mobility, strength, ROM, and power in BLE, as well as poor activity tolerance and dynamic stability due to above dx and chronic debility from PTA. The pt was able to perform initial bed mobility and sit-stand transfers with min-modA of 2 to manage both lines and physical assist due to deficits listed above and pain from surgery. Suspect the pt will make good progress with improved pain management and reduction in lines, and will be able to return home with family assist and DME listed below. Will continue to assess progress and d/c needs during each session.        Recommendations for follow up therapy are one component of a multi-disciplinary discharge planning process, led by the attending physician.  Recommendations may be updated based on patient status, additional functional criteria and insurance authorization.  Follow Up Recommendations Home health PT    Assistance Recommended at Discharge Frequent or constant Supervision/Assistance  Functional Status Assessment Patient has had a recent decline in their functional status and demonstrates the ability to make significant improvements in function in a reasonable and  predictable amount of time.  Equipment Recommendations  Rolling walker (2 wheels);Wheelchair (measurements PT);Wheelchair cushion (measurements PT)    Recommendations for Other Services       Precautions / Restrictions Precautions Precautions: Fall;Other (comment) Precaution Comments: NG tube, Aline LUE, foley Restrictions Weight Bearing Restrictions: No      Mobility  Bed Mobility Overal bed mobility: Needs Assistance Bed Mobility: Supine to Sit;Sit to Supine     Supine to sit: Min assist;+2 for physical assistance Sit to supine: Mod assist;+2 for physical assistance   General bed mobility comments: minA to come to sitting with minA to each LE and minA to support behind, modA to return BLe to bed    Transfers Overall transfer level: Needs assistance Equipment used: Rolling walker (2 wheels) Transfers: Sit to/from Stand Sit to Stand: Mod assist;+2 physical assistance           General transfer comment: modA of 2 to power up to standing, completed x2 in session from EOB. VSS with transfers    Ambulation/Gait Ambulation/Gait assistance: Mod assist;+2 physical assistance Gait Distance (Feet): 3 Feet Assistive device: Rolling walker (2 wheels) Gait Pattern/deviations: Step-to pattern Gait velocity: decreased Gait velocity interpretation: <1.31 ft/sec, indicative of household ambulator   General Gait Details: small lateral steps along EOB with assist to steady and move Rw     Balance Overall balance assessment: Needs assistance Sitting-balance support: No upper extremity supported;Feet supported Sitting balance-Leahy Scale: Fair Sitting balance - Comments: static sit without UE support, reaching outside BOS limited by abdominal pain   Standing balance support: Bilateral upper extremity supported;During functional activity Standing balance-Leahy Scale: Poor Standing balance comment:  dependent on BUE support                             Pertinent  Vitals/Pain Pain Assessment: Faces Faces Pain Scale: Hurts even more Pain Location: abdomen Pain Descriptors / Indicators: Discomfort;Grimacing;Moaning Pain Intervention(s): Limited activity within patient's tolerance;Monitored during session;Repositioned;PCA encouraged    Home Living Family/patient expects to be discharged to:: Private residence Living Arrangements: Spouse/significant other;Other relatives Available Help at Discharge: Family;Available 24 hours/day (pt reports ex-wife can stay with him for a few weeks) Type of Home: House         Home Layout: One level Home Equipment: Cane - quad;BSC/3in1 Additional Comments: uses 3-in-1 as shower seat and toilet riser    Prior Function Prior Level of Function : Needs assist;History of Falls (last six months)       Physical Assist : Mobility (physical);ADLs (physical) Mobility (physical): Gait;Stairs ADLs (physical): IADLs Mobility Comments: pt states he uses quad cane in the home, but spends most of every day in bed (standing from bed only ~4x/day) and frequent falls when he is out of bed ADLs Comments: pt reports wife completes all IADLs     Hand Dominance   Dominant Hand: Right    Extremity/Trunk Assessment   Upper Extremity Assessment Upper Extremity Assessment: Defer to OT evaluation    Lower Extremity Assessment Lower Extremity Assessment: Generalized weakness (able to move PROM against gravity but not complete due to pain/weakness bilaterally)    Cervical / Trunk Assessment Cervical / Trunk Assessment: Other exceptions Cervical / Trunk Exceptions: abdominal surgey  Communication   Communication: No difficulties  Cognition Arousal/Alertness: Awake/alert Behavior During Therapy: WFL for tasks assessed/performed Overall Cognitive Status: Within Functional Limits for tasks assessed                                          General Comments General comments (skin integrity, edema, etc.): HR  stable (90-110), BL 140s/80s    Exercises     Assessment/Plan    PT Assessment Patient needs continued PT services  PT Problem List Decreased strength;Decreased range of motion;Decreased activity tolerance;Decreased balance;Decreased mobility;Decreased coordination;Decreased cognition;Decreased safety awareness;Pain       PT Treatment Interventions DME instruction;Gait training;Stair training;Functional mobility training;Therapeutic activities;Therapeutic exercise;Balance training;Patient/family education    PT Goals (Current goals can be found in the Care Plan section)  Acute Rehab PT Goals Patient Stated Goal: reduce pain, be able to move in house without falling PT Goal Formulation: With patient Time For Goal Achievement: 03/05/21 Potential to Achieve Goals: Fair    Frequency Min 3X/week   Barriers to discharge        Co-evaluation   Reason for Co-Treatment: To address functional/ADL transfers;For patient/therapist safety;Complexity of the patient's impairments (multi-system involvement)   OT goals addressed during session: ADL's and self-care       AM-PAC PT "6 Clicks" Mobility  Outcome Measure Help needed turning from your back to your side while in a flat bed without using bedrails?: Total Help needed moving from lying on your back to sitting on the side of a flat bed without using bedrails?: Total Help needed moving to and from a bed to a chair (including a wheelchair)?: Total Help needed standing up from a chair using your arms (e.g., wheelchair or bedside chair)?: Total Help needed to walk in hospital room?: Total Help  needed climbing 3-5 steps with a railing? : Total 6 Click Score: 6    End of Session Equipment Utilized During Treatment: Oxygen Activity Tolerance: Patient tolerated treatment well;Patient limited by fatigue Patient left: in bed;with call bell/phone within reach;with nursing/sitter in room Nurse Communication: Mobility status PT Visit  Diagnosis: Unsteadiness on feet (R26.81);Other abnormalities of gait and mobility (R26.89);Repeated falls (R29.6);Muscle weakness (generalized) (M62.81);History of falling (Z91.81)    Time: 6160-7371 PT Time Calculation (min) (ACUTE ONLY): 37 min   Charges:   PT Evaluation $PT Eval High Complexity: 1 High          Vickki Muff, PT, DPT   Acute Rehabilitation Department Pager #: [phone]  Ronnie Derby 02/19/2021, 1:33 PM

## 2021-02-19 NOTE — Progress Notes (Addendum)
Critical Care Note    02/19/2021 7:44 AM 1 Day Post-Op  Subjective:  Awake and alert. Feels like he has an overfilled bladder, expected abd soreness. Passing flatus. Not using PCA as instructed.   Vitals:   02/19/21 0545 02/19/21 0600  BP:  125/70  Pulse: 94 92  Resp: 20 18  Temp:    SpO2: 100% 100%   Systolic BP:120-130s HR:90s UOP:1400cc Tele: NSR  Physical Exam: General appearance: Awake, alert in no apparent distress Cardiac: Heart rate and rhythm are regular Respirations: Nonlabored Abdomen: midline incision well approximated. Non-distended. Active bowel sounds. Approximately 100 cc of cloudy NG output since post-op Incisions: Right/Left groin incisions are all well approximated without bleeding or hematoma Extremities: Both feet are warm with intact sensation and motor function.  Ischemic changes noted and unchanged. Pulse/Doppler exam: Palpable DP pulses bilaterally.   CBC    Component Value Date/Time   WBC 11.7 (H) 02/19/2021 0422   RBC 3.42 (L) 02/19/2021 0422   HGB 9.7 (L) 02/19/2021 0422   HGB 13.2 08/16/2018 1406   HCT 29.3 (L) 02/19/2021 0422   HCT 37.1 (L) 08/16/2018 1406   PLT 273 02/19/2021 0422   PLT 332 08/16/2018 1406   MCV 85.7 02/19/2021 0422   MCV 87 08/16/2018 1406   MCH 28.4 02/19/2021 0422   MCHC 33.1 02/19/2021 0422   RDW 13.3 02/19/2021 0422   RDW 13.2 08/16/2018 1406   LYMPHSABS 2.4 12/31/2020 1949   MONOABS 0.5 12/31/2020 1949   EOSABS 0.2 12/31/2020 1949   BASOSABS 0.1 12/31/2020 1949    BMET    Component Value Date/Time   NA 140 02/19/2021 0422   NA 135 03/18/2020 0920   K 4.4 02/19/2021 0422   CL 112 (H) 02/19/2021 0422   CO2 23 02/19/2021 0422   GLUCOSE 99 02/19/2021 0422   BUN 18 02/19/2021 0422   BUN 28 (H) 03/18/2020 0920   CREATININE 0.95 02/19/2021 0422   CALCIUM 8.0 (L) 02/19/2021 0422   GFRNONAA >60 02/19/2021 0422   GFRAA 55 (L) 03/18/2020 0920     Intake/Output Summary (Last 24 hours) at 02/19/2021  0744 Last data filed at 02/19/2021 0500 Gross per 24 hour  Intake 6336.13 ml  Output 1950 ml  Net 4386.13 ml    HOSPITAL MEDICATIONS Scheduled Meds:  Chlorhexidine Gluconate Cloth  6 each Topical Daily   heparin injection (subcutaneous)  5,000 Units Subcutaneous Q8H   HYDROmorphone   Intravenous Q4H   insulin aspart  0-9 Units Subcutaneous TID WC   mouth rinse  15 mL Mouth Rinse BID   pantoprazole (PROTONIX) IV  40 mg Intravenous Q24H   Continuous Infusions:  sodium chloride     sodium chloride 10 mL/hr at 02/19/21 0500   0.9 % NaCl with KCl 20 mEq / L 125 mL/hr at 02/19/21 0500   PRN Meds:.sodium chloride, sodium chloride, acetaminophen **OR** acetaminophen, alum & mag hydroxide-simeth, diphenhydrAMINE **OR** diphenhydrAMINE, hydrALAZINE, labetalol, metoprolol tartrate, naloxone **AND** sodium chloride flush, ondansetron, potassium chloride  Systems Assessment: Hemodynamics: stable Pulmonary: normal SaO2 on 3L via [place] Renal: good UOP, normal creatinine Vascular: no signs of malperfusion; stable right GT ulcer, palpable pedal pulses ID: afebrile, no significant leukocytosis, prophylactic abx Nutrition: NPO/NGT continues today. Continue IVFs    Plan: -POD 1 aortobifemoral bypass. VSS. Main issue this morning is pain control. Encouraged use of PCA. Needs to start his IS. Continue NGT and IVFs and supportive care. Restart home meds once he is taking PO  -DVT prophylaxis:  heparin [place]; SCDs   VASCULAR STAFF ADDENDUM: I have independently interviewed and examined the patient. I agree with the above.  Pt doing as expected s/p ABF Palpable pulses, incisions CDI, abd soft, appropriately tender OOB, keep NGT, PT/OT, Plan for floor tomorrow pending continued improvement  Fara Olden, MD [facility] Phone Number: [phone] 02/19/2021 4:22 PM    [facility] [phone] 02/19/2021  7:44  AM

## 2021-02-19 NOTE — Evaluation (Signed)
Occupational Therapy Evaluation Patient Details Name: Dylan Anderson MRN: [id number] DOB: 17-Jun-1960 Today's Date: 02/19/2021   History of Present Illness The pt is a 60 yo male presenting 11/17 for aortobifemoral bypass due to critical limb ischemia and tissue loss. PMH includes: HTN, HLD, DM II, CAD, PVD, prior bilateral common iliac stents (8-9 years ago and again in 06/2020).   Clinical Impression   PTA, pt reports Modified Independent with mobility using cane though endorses frequent falls. Pt reports Modified Independent with ADLs with ex-wife assisting with IADLs (recently moved in together). Pt presents now with deficits in standing balance, endurance, strength and pain. Pt overall Mod A x 2 for bed mobility, sit to stand transfers and side steps at EOB using RW. Pt requires Min A for UB ADLs and Max A for LB ADLs. Began educating on compensatory strategies for LB ADLs to minimize abdominal discomfort. Anticipate with pain control and continued activity, pt will progress to return home with [facility] services and ADL assist as needed from family. Will continue to follow acutely and monitor progress.   SpO2 WFL on 2 L O2 Systolic BP 140s-150s HR 90s-100s.     Recommendations for follow up therapy are one component of a multi-disciplinary discharge planning process, led by the attending physician.  Recommendations may be updated based on patient status, additional functional criteria and insurance authorization.   Follow Up Recommendations  Home health OT    Assistance Recommended at Discharge Frequent or constant Supervision/Assistance  Functional Status Assessment  Patient has had a recent decline in their functional status and demonstrates the ability to make significant improvements in function in a reasonable and predictable amount of time.  Equipment Recommendations  Other (comment) (Rolling walker)    Recommendations for Other Services       Precautions / Restrictions  Precautions Precautions: Fall;Other (comment) Precaution Comments: NG tube, Aline LUE, foley, monitor BP, PCA pump Restrictions Weight Bearing Restrictions: No      Mobility Bed Mobility Overal bed mobility: Needs Assistance Bed Mobility: Supine to Sit;Sit to Supine     Supine to sit: Min assist;+2 for physical assistance Sit to supine: Mod assist;+2 for physical assistance   General bed mobility comments: minA to come to sitting with minA to each LE and minA to support behind, modA to return BLe to bed    Transfers Overall transfer level: Needs assistance Equipment used: Rolling walker (2 wheels) Transfers: Sit to/from Stand Sit to Stand: Mod assist;+2 physical assistance           General transfer comment: modA of 2 to power up to standing, completed x2 in session from EOB. VSS with transfers      Balance Overall balance assessment: Needs assistance Sitting-balance support: No upper extremity supported;Feet supported Sitting balance-Leahy Scale: Fair Sitting balance - Comments: static sit without UE support, reaching outside BOS limited by abdominal pain   Standing balance support: Bilateral upper extremity supported;During functional activity Standing balance-Leahy Scale: Poor Standing balance comment: dependent on BUE support                           ADL either performed or assessed with clinical judgement   ADL Overall ADL's : Needs assistance/impaired Eating/Feeding: NPO Eating/Feeding Details (indicate cue type and reason): likely able to complete with setup but NPO Grooming: Set up;Sitting   Upper Body Bathing: Minimal assistance;Sitting   Lower Body Bathing: Moderate assistance;Sit to/from stand   Upper Body Dressing : Minimal  assistance;Sitting   Lower Body Dressing: Maximal assistance;Sit to/from stand Lower Body Dressing Details (indicate cue type and reason): assist for socks due to abdominal pain, began education on compensatory  strategies to complete tasks without excessive abdominal pain                     Vision Ability to See in Adequate Light: 0 Adequate Patient Visual Report: No change from baseline Vision Assessment?: No apparent visual deficits     Perception     Praxis      Pertinent Vitals/Pain Pain Assessment: Faces Faces Pain Scale: Hurts even more Pain Location: abdomen Pain Descriptors / Indicators: Discomfort;Grimacing;Moaning Pain Intervention(s): Monitored during session;Limited activity within patient's tolerance;PCA encouraged     Hand Dominance Right   Extremity/Trunk Assessment Upper Extremity Assessment Upper Extremity Assessment: Generalized weakness;LUE deficits/detail LUE Deficits / Details: reports new shoulder pain since fall at home, flexion to 90* unable to lift further due to pain (RN aware, reports pt mention to MD today as well) LUE: Shoulder pain with ROM LUE Coordination: decreased gross motor   Lower Extremity Assessment Lower Extremity Assessment: Defer to PT evaluation   Cervical / Trunk Assessment Cervical / Trunk Assessment: Other exceptions Cervical / Trunk Exceptions: abdominal surgey   Communication Communication Communication: No difficulties   Cognition Arousal/Alertness: Awake/alert Behavior During Therapy: WFL for tasks assessed/performed Overall Cognitive Status: Within Functional Limits for tasks assessed                                       General Comments  HR 90-low100s; starting BP 150/85 (102), 144/65 (83 sitting EOB), 142/72 (93) after standing    Exercises     Shoulder Instructions      Home Living Family/patient expects to be discharged to:: Private residence Living Arrangements: Spouse/significant other;Other relatives Available Help at Discharge: Family;Available 24 hours/day (pt reports ex-wife can stay with him for a few weeks) Type of Home: House       Home Layout: One level     Bathroom  Shower/Tub: Chief Strategy Officer: Standard     Home Equipment: Cane - quad;BSC/3in1   Additional Comments: uses 3-in-1 as shower seat and toilet riser      Prior Functioning/Environment Prior Level of Function : Needs assist;History of Falls (last six months)       Physical Assist : Mobility (physical);ADLs (physical) Mobility (physical): Gait;Stairs ADLs (physical): IADLs Mobility Comments: pt states he uses quad cane in the home, but spends most of every day in bed (standing from bed only ~4x/day) and frequent falls when he is out of bed ADLs Comments: pt reports wife completes all IADLs        OT Problem List: Decreased strength;Decreased activity tolerance;Decreased range of motion;Impaired balance (sitting and/or standing);Decreased knowledge of use of DME or AE;Decreased knowledge of precautions;Pain      OT Treatment/Interventions: Self-care/ADL training;Therapeutic exercise;Energy conservation;DME and/or AE instruction;Therapeutic activities;Patient/family education;Balance training    OT Goals(Current goals can be found in the care plan section) Acute Rehab OT Goals Patient Stated Goal: decrease pain, get better and go home OT Goal Formulation: With patient Time For Goal Achievement: 03/05/21 Potential to Achieve Goals: Good  OT Frequency: Min 2X/week   Barriers to D/C:            Co-evaluation PT/OT/SLP Co-Evaluation/Treatment: Yes Reason for Co-Treatment: For patient/therapist safety;To address functional/ADL transfers;Complexity  of the patient's impairments (multi-system involvement)   OT goals addressed during session: ADL's and self-care      AM-PAC OT "6 Clicks" Daily Activity     Outcome Measure Help from another person eating meals?: A Little Help from another person taking care of personal grooming?: A Little Help from another person toileting, which includes using toliet, bedpan, or urinal?: A Lot Help from another person bathing  (including washing, rinsing, drying)?: A Lot Help from another person to put on and taking off regular upper body clothing?: A Little Help from another person to put on and taking off regular lower body clothing?: A Lot 6 Click Score: 15   End of Session Equipment Utilized During Treatment: Rolling walker (2 wheels);Oxygen Nurse Communication: Mobility status;Other (comment) (RN present intermittently with session)  Activity Tolerance: Patient tolerated treatment well Patient left: in bed;with call bell/phone within reach;with nursing/sitter in room  OT Visit Diagnosis: Unsteadiness on feet (R26.81);Other abnormalities of gait and mobility (R26.89);Muscle weakness (generalized) (M62.81);Pain Pain - part of body:  (abdomen)                Time: 3474-2595 OT Time Calculation (min): 39 min Charges:  OT General Charges $OT Visit: 1 Visit OT Evaluation $OT Eval Moderate Complexity: 1 Mod  Bradd Canary, OTR/L Acute Rehab Services Office: [phone]   Lorre Munroe 02/19/2021, 2:15 PM

## 2021-02-20 LAB — GLUCOSE, CAPILLARY
Glucose-Capillary: 171 mg/dL — ABNORMAL HIGH (ref 70–99)
Glucose-Capillary: 167 mg/dL — ABNORMAL HIGH (ref 70–99)
Glucose-Capillary: 150 mg/dL — ABNORMAL HIGH (ref 70–99)
Glucose-Capillary: 210 mg/dL — ABNORMAL HIGH (ref 70–99)

## 2021-02-20 MED ORDER — ASPIRIN EC 81 MG PO TBEC
81.0000 mg | DELAYED_RELEASE_TABLET | Freq: Every day | ORAL | Status: DC
  Administered 2021-02-20 – 2021-02-25 (×6): 81 mg via ORAL
  Filled 2021-02-20 (×6): qty 1

## 2021-02-20 MED ORDER — ATORVASTATIN CALCIUM 80 MG PO TABS
80.0000 mg | ORAL_TABLET | Freq: Every day | ORAL | Status: DC
  Administered 2021-02-20 – 2021-02-25 (×6): 80 mg via ORAL
  Filled 2021-02-20 (×6): qty 1

## 2021-02-20 MED ORDER — PREGABALIN 100 MG PO CAPS
100.0000 mg | ORAL_CAPSULE | Freq: Three times a day (TID) | ORAL | Status: DC
  Administered 2021-02-20 – 2021-02-25 (×15): 100 mg via ORAL
  Filled 2021-02-20 (×15): qty 1

## 2021-02-20 MED ORDER — TAMSULOSIN HCL 0.4 MG PO CAPS
0.4000 mg | ORAL_CAPSULE | Freq: Every day | ORAL | Status: DC
  Administered 2021-02-20 – 2021-02-25 (×6): 0.4 mg via ORAL
  Filled 2021-02-20 (×6): qty 1

## 2021-02-20 MED ORDER — AMLODIPINE BESYLATE 5 MG PO TABS
5.0000 mg | ORAL_TABLET | Freq: Every day | ORAL | Status: DC
  Administered 2021-02-20 – 2021-02-25 (×6): 5 mg via ORAL
  Filled 2021-02-20 (×6): qty 1

## 2021-02-20 MED ORDER — LABETALOL HCL 5 MG/ML IV SOLN
10.0000 mg | INTRAVENOUS | Status: DC | PRN
Start: 2021-02-20 — End: 2021-02-25
  Administered 2021-02-20: 20 mg via INTRAVENOUS
  Filled 2021-02-20: qty 4

## 2021-02-20 MED ORDER — FENOFIBRATE 54 MG PO TABS
54.0000 mg | ORAL_TABLET | Freq: Every day | ORAL | Status: DC
  Administered 2021-02-20 – 2021-02-25 (×6): 54 mg via ORAL
  Filled 2021-02-20 (×7): qty 1

## 2021-02-20 MED ORDER — METOPROLOL SUCCINATE ER 50 MG PO TB24
50.0000 mg | ORAL_TABLET | Freq: Every day | ORAL | Status: DC
  Administered 2021-02-20 – 2021-02-24 (×5): 50 mg via ORAL
  Filled 2021-02-20 (×5): qty 1

## 2021-02-20 NOTE — Progress Notes (Signed)
Physical Therapy Treatment Patient Details Name: Dylan Anderson MRN: [id number] DOB: 10/22/1960 Today's Date: 02/20/2021   History of Present Illness The pt is a 60 yo male presenting 11/17 for aortobifemoral bypass due to critical limb ischemia and tissue loss. PMH includes: HTN, HLD, DM II, CAD, PVD, prior bilateral common iliac stents (8-9 years ago and again in 06/2020).    PT Comments    PT pleasant and in chair on arrival. Pt with lines removed prior to session and able to increase gait to hall way distance with RW limited by fatigue and pain. PT educated for HEP and encouraged to continue to progress OOB activity with nursing staff. D/C plan appropriate and will continue to follow.   HR 120 with gait SPO2 97% on RA    Recommendations for follow up therapy are one component of a multi-disciplinary discharge planning process, led by the attending physician.  Recommendations may be updated based on patient status, additional functional criteria and insurance authorization.  Follow Up Recommendations  Home health PT     Assistance Recommended at Discharge Intermittent Supervision/Assistance  Equipment Recommendations  Rolling walker (2 wheels);BSC/3in1    Recommendations for Other Services       Precautions / Restrictions Precautions Precautions: Fall     Mobility  Bed Mobility               General bed mobility comments: in chair on arrival    Transfers Overall transfer level: Needs assistance   Transfers: Sit to/from Stand Sit to Stand: Min assist           General transfer comment: cues for hand placement with assist to rise from surface, guarding to sit    Ambulation/Gait Ambulation/Gait assistance: Min assist Gait Distance (Feet): 70 Feet Assistive device: Rolling walker (2 wheels) Gait Pattern/deviations: Step-through pattern;Decreased stride length   Gait velocity interpretation: <1.8 ft/sec, indicate of risk for recurrent falls   General Gait  Details: cues for position in RW and safety. Pt able to self regulate distance, slow speed   Stairs             Wheelchair Mobility    Modified Rankin (Stroke Patients Only)       Balance Overall balance assessment: Needs assistance   Sitting balance-Leahy Scale: Good     Standing balance support: Bilateral upper extremity supported;During functional activity Standing balance-Leahy Scale: Poor Standing balance comment: RW for standing and gait                            Cognition Arousal/Alertness: Awake/alert Behavior During Therapy: WFL for tasks assessed/performed Overall Cognitive Status: Within Functional Limits for tasks assessed                                          Exercises General Exercises - Lower Extremity Long Arc Quad: AROM;Both;Seated;15 reps Hip ABduction/ADduction: AROM;Both;Seated;15 reps Hip Flexion/Marching: AROM;Both;Seated;15 reps Toe Raises: AROM;Both;Seated;15 reps Heel Raises: AROM;Both;Seated;15 reps    General Comments        Pertinent Vitals/Pain Faces Pain Scale: Hurts little more Pain Location: abdomen and left shoulder Pain Descriptors / Indicators: Aching;Guarding Pain Intervention(s): Limited activity within patient's tolerance;Monitored during session;PCA encouraged;Repositioned    Home Living  Prior Function            PT Goals (current goals can now be found in the care plan section) Progress towards PT goals: Progressing toward goals    Frequency    Min 3X/week      PT Plan Current plan remains appropriate    Co-evaluation              AM-PAC PT "6 Clicks" Mobility   Outcome Measure  Help needed turning from your back to your side while in a flat bed without using bedrails?: A Little Help needed moving from lying on your back to sitting on the side of a flat bed without using bedrails?: A Little Help needed moving to and from a  bed to a chair (including a wheelchair)?: A Little Help needed standing up from a chair using your arms (e.g., wheelchair or bedside chair)?: A Little Help needed to walk in hospital room?: A Little Help needed climbing 3-5 steps with a railing? : A Lot 6 Click Score: 17    End of Session Equipment Utilized During Treatment: Gait belt Activity Tolerance: Patient tolerated treatment well Patient left: in chair;with call bell/phone within reach;with nursing/sitter in room Nurse Communication: Mobility status PT Visit Diagnosis: Unsteadiness on feet (R26.81);Other abnormalities of gait and mobility (R26.89);Muscle weakness (generalized) (M62.81);History of falling (Z91.81)     Time: 4174-0814 PT Time Calculation (min) (ACUTE ONLY): 26 min  Charges:  $Gait Training: 8-22 mins $Therapeutic Exercise: 8-22 mins                     Makynna Manocchio P, PT Acute Rehabilitation Services Pager: [phone] Office: [phone]    Yianna Tersigni B Ceara Wrightson 02/20/2021, 8:25 AM

## 2021-02-20 NOTE — Progress Notes (Signed)
[facility] [place] PROGRESS NOTE  ASSESSMENT / PLAN: Dylan Anderson is a 60 y.o. male status post aortobifemoral bypass 02/18/21 for ischemic rest pain  CNS: PCA pain control. PT / OT / OOB / Ambulate. Therapy recommending home health therapies CV: Stable hemodynamics.  Remove arterial line today. Pulm: Encourage pulmonary hygiene: IS / OOB. GI: NPO/NG tube.  Minimal NG output.  Passing flatus.  Remove NG tube.  Okay for sips. FEN/GU: Good urine output.  Remove Foley today.  Check BMP tomorrow Heme: No CBC today.  Check CBC tomorrow. ID: No evidence of infection. Endo: BG goal 120-180. Prophy: SQH. SCDs.   Transfer to 4 E.  SUBJECTIVE: Doing well.  Pain is well controlled.  Up and out of bed yesterday with therapy.  Passing flatus.  Bowel sounds per bedside nurse.  Thirsty.  OBJECTIVE: BP (!) 167/64   Pulse (!) 119   Temp 98.2 F (36.8 C) (Oral)   Resp 13   Ht 5\' 10"  (1.778 m)   Wt 62.6 kg   SpO2 97%   BMI 19.80 kg/m   Intake/Output Summary (Last 24 hours) at 02/20/2021 0923 Last data filed at 02/20/2021 0800 Gross per 24 hour  Intake 3012.9 ml  Output 2000 ml  Net 1012.9 ml    Constitutional: Well-appearing.  No acute distress. Cardiac: Regular rate and rhythm. Pulmonary: Unlabored breathing Abdomen: Soft.  Appropriate postoperative tenderness.  Not distended. Vascular: Incisions clean dry and intact.  2+ palpable dorsalis pedis pulses through his socks.  CBC Latest Ref Rng & Units 02/19/2021 02/18/2021 02/18/2021  WBC 4.0 - 10.5 K/uL 11.7(H) 10.6(H) 8.4  Hemoglobin 13.0 - 17.0 g/dL 02/20/2021) 5.8(K) 9.9(I)  Hematocrit 39.0 - 52.0 % 29.3(L) 29.4(L) 28.5(L)  Platelets 150 - 400 K/uL 273 271 264     CMP Latest Ref Rng & Units 02/19/2021 02/18/2021 02/18/2021  Glucose 70 - 99 mg/dL 99 02/20/2021) 250(N)  BUN 6 - 20 mg/dL 18 13 14   Creatinine 0.61 - 1.24 mg/dL 397(Q 7.34  Sodium 135 - 145 mmol/L 140 134(L) 134(L)  Potassium 3.5 - 5.1 mmol/L 4.4  4.0 3.8  Chloride 98 - 111 mmol/L 112(H) 105 104  CO2 22 - 32 mmol/L 23 21(L) 21(L)  Calcium 8.9 - 10.3 mg/dL 8.0(L) 8.0(L) 8.0(L)  Total Protein 6.5 - 8.1 g/dL 4.8(L) - -  Total Bilirubin 0.3 - 1.2 mg/dL 0.6 - -  Alkaline Phos 38 - 126 U/L 49 - -  AST 15 - 41 U/L 17 - -  ALT 0 - 44 U/L 13 - -    Estimated Creatinine Clearance: 74.1 mL/min (by C-G formula based on SCr of 0.95 mg/dL).  1.93. 7.90, MD [facility] Phone Number: [phone] 02/20/2021 9:23 AM

## 2021-02-20 NOTE — Progress Notes (Signed)
   02/20/21 1430  Assess: MEWS Score  Temp 97.8 F (36.6 C)  BP (!) 159/68  Pulse Rate (!) 115  ECG Heart Rate (!) 116  Resp 18  Level of Consciousness Alert  SpO2 96 %  O2 Device Room Air  Assess: MEWS Score  MEWS Temp 0  MEWS Systolic 0  MEWS Pulse 2  MEWS RR 0  MEWS LOC 0  MEWS Score 2  MEWS Score Color Yellow  Treat  Pain Scale Faces  Pain Score 0  Escalate  MEWS: Escalate Yellow: discuss with charge nurse/RN and consider discussing with provider and RRT  Notify: Charge Nurse/RN  Name of Charge Nurse/RN Notified Belenda Cruise  Date Charge Nurse/RN Notified 02/20/21

## 2021-02-20 NOTE — Progress Notes (Signed)
   02/20/21 1548  Assess: MEWS Score  Temp 99.6 F (37.6 C)  BP (!) 164/86  Pulse Rate (!) 114  ECG Heart Rate (!) 114  Resp 20  SpO2 96 %  Assess: MEWS Score  MEWS Temp 0  MEWS Systolic 0  MEWS Pulse 2  MEWS RR 0  MEWS LOC 0  MEWS Score 2  MEWS Score Color Yellow  Treat  Pain Scale Faces  Pain Score 4  Pain Intervention(s) Pain pump;PCA encouraged  Escalate  MEWS: Escalate Yellow: discuss with charge nurse/RN and consider discussing with provider and RRT  Notify: Charge Nurse/RN  Name of Charge Nurse/RN Notified Belenda Cruise  Date Charge Nurse/RN Notified 02/20/21

## 2021-02-20 NOTE — Progress Notes (Signed)
   02/20/21 1230  Assess: MEWS Score  Temp 97.9 F (36.6 C)  BP (!) 161/79  Pulse Rate (!) 111  Resp 20  Level of Consciousness Alert  SpO2 96 %  O2 Device Room Air  O2 Flow Rate (L/min) 0 L/min  Assess: MEWS Score  MEWS Temp 0  MEWS Systolic 0  MEWS Pulse 2  MEWS RR 0  MEWS LOC 0  MEWS Score 2  MEWS Score Color Yellow  Treat  Pain Scale 0-10  Pain Score 5  Pain Intervention(s) Pain pump;PCA encouraged  Escalate  MEWS: Escalate Yellow: discuss with charge nurse/RN and consider discussing with provider and RRT  Notify: Charge Nurse/RN  Name of Charge Nurse/RN Notified Belenda Cruise  Date Charge Nurse/RN Notified 02/20/21

## 2021-02-21 LAB — BASIC METABOLIC PANEL
Anion gap: 8 (ref 5–15)
BUN: 14 mg/dL (ref 6–20)
CO2: 19 mmol/L — ABNORMAL LOW (ref 22–32)
Calcium: 7.9 mg/dL — ABNORMAL LOW (ref 8.9–10.3)
Chloride: 110 mmol/L (ref 98–111)
Creatinine, Ser: 0.97 mg/dL (ref 0.61–1.24)
GFR, Estimated: >60 mL/min (ref >60)
Glucose, Bld: 159 mg/dL — ABNORMAL HIGH (ref 70–99)
Potassium: 4.3 mmol/L (ref 3.5–5.1)
Sodium: 137 mmol/L (ref 135–145)

## 2021-02-21 LAB — CBC
HCT: 24.7 % — ABNORMAL LOW (ref 39.0–52.0)
Hemoglobin: 8 g/dL — ABNORMAL LOW (ref 13.0–17.0)
MCH: 28.9 pg (ref 26.0–34.0)
MCHC: 32.4 g/dL (ref 30.0–36.0)
MCV: 89.2 fL (ref 80.0–100.0)
Platelets: 253 10*3/uL (ref 150–400)
RBC: 2.77 MIL/uL — ABNORMAL LOW (ref 4.22–5.81)
RDW: 12.8 % (ref 11.5–15.5)
WBC: 8.3 10*3/uL (ref 4.0–10.5)
nRBC: 0 % (ref 0.0–0.2)

## 2021-02-21 LAB — GLUCOSE, CAPILLARY
Glucose-Capillary: 195 mg/dL — ABNORMAL HIGH (ref 70–99)
Glucose-Capillary: 142 mg/dL — ABNORMAL HIGH (ref 70–99)
Glucose-Capillary: 151 mg/dL — ABNORMAL HIGH (ref 70–99)
Glucose-Capillary: 169 mg/dL — ABNORMAL HIGH (ref 70–99)

## 2021-02-21 MED ORDER — INFLUENZA VAC SPLIT QUAD 0.5 ML IM SUSY
0.5000 mL | PREFILLED_SYRINGE | INTRAMUSCULAR | Status: DC
  Filled 2021-02-21 (×2): qty 0.5

## 2021-02-21 NOTE — Progress Notes (Addendum)
[facility] [place]  Assessment/Planning: Dylan Anderson is a 60 y.o. male status post aortobifemoral bypass 02/18/21 for ischemic rest pain  Inflow with doppler signal DP  Tolerating small amounts of PO liquids will advance clears as tolerates.  1 bout of nausea treated with Zofran.  No vomiting.   Labs stable with mild leukocytosis will encourage IS while awake, no signs of infection. Incision healing well, no erythema or abnormal edema Voiding independently 950 cc Op     Subjective  - Pain issue seem to be slowly improving   Objective 130/62 85 98.5 F (36.9 C) (Oral) 17 94%  Intake/Output Summary (Last 24 hours) at 02/21/2021 1132 Last data filed at 02/21/2021 0600 Gross per 24 hour  Intake 1640.4 ml  Output 950 ml  Net 690.4 ml    Alert & O x3 Abdomin soft, incision healing well Groins soft Doppler signals B DP, motor intact denise rest pain Lungs non labored breathing      Leonie Douglas 02/21/2021 11:32 AM --  Laboratory Lab Results: Recent Labs    02/18/21 1630 02/19/21 0422  WBC 10.6* 11.7*  HGB 9.9* 9.7*  HCT 29.4* 29.3*  PLT 271 273   BMET Recent Labs    02/18/21 1630 02/19/21 0422  NA 134* 140  K 4.0 4.4  CL 105 112*  CO2 21* 23  GLUCOSE 179* 99  BUN 13 18  CREATININE 1.01 0.95  CALCIUM 8.0* 8.0*    COAG Lab Results  Component Value Date   INR 1.1 02/18/2021   INR 0.9 02/15/2021   INR 0.9 12/30/2020   No results found for: PTT   VASCULAR STAFF ADDENDUM: I have independently interviewed and examined the patient. I agree with the above.  Clears as tolerated. Mobilize as able.  Rande Brunt. Lenell Antu, MD [facility] Phone Number: [phone] 02/21/2021 11:32 AM

## 2021-02-22 LAB — CBC
HCT: 25.3 % — ABNORMAL LOW (ref 39.0–52.0)
Hemoglobin: 8.4 g/dL — ABNORMAL LOW (ref 13.0–17.0)
MCH: 29.2 pg (ref 26.0–34.0)
MCHC: 33.2 g/dL (ref 30.0–36.0)
MCV: 87.8 fL (ref 80.0–100.0)
Platelets: 299 10*3/uL (ref 150–400)
RBC: 2.88 MIL/uL — ABNORMAL LOW (ref 4.22–5.81)
RDW: 12.8 % (ref 11.5–15.5)
WBC: 6.8 10*3/uL (ref 4.0–10.5)
nRBC: 0 % (ref 0.0–0.2)

## 2021-02-22 LAB — BASIC METABOLIC PANEL
Anion gap: 6 (ref 5–15)
BUN: 10 mg/dL (ref 6–20)
CO2: 21 mmol/L — ABNORMAL LOW (ref 22–32)
Calcium: 7.9 mg/dL — ABNORMAL LOW (ref 8.9–10.3)
Chloride: 110 mmol/L (ref 98–111)
Creatinine, Ser: 0.89 mg/dL (ref 0.61–1.24)
GFR, Estimated: >60 mL/min (ref >60)
Glucose, Bld: 184 mg/dL — ABNORMAL HIGH (ref 70–99)
Potassium: 4.1 mmol/L (ref 3.5–5.1)
Sodium: 137 mmol/L (ref 135–145)

## 2021-02-22 LAB — GLUCOSE, CAPILLARY
Glucose-Capillary: 190 mg/dL — ABNORMAL HIGH (ref 70–99)
Glucose-Capillary: 181 mg/dL — ABNORMAL HIGH (ref 70–99)
Glucose-Capillary: 191 mg/dL — ABNORMAL HIGH (ref 70–99)
Glucose-Capillary: 260 mg/dL — ABNORMAL HIGH (ref 70–99)

## 2021-02-22 MED ORDER — DOCUSATE SODIUM 100 MG PO CAPS
100.0000 mg | ORAL_CAPSULE | Freq: Two times a day (BID) | ORAL | Status: DC
  Administered 2021-02-22 – 2021-02-24 (×4): 100 mg via ORAL
  Filled 2021-02-22 (×7): qty 1

## 2021-02-22 MED ORDER — HYDROMORPHONE HCL 1 MG/ML IJ SOLN: 1.0000 mg | INTRAMUSCULAR | Status: DC | PRN

## 2021-02-22 MED ORDER — LIVING WELL WITH DIABETES BOOK
Freq: Once | Status: AC
  Filled 2021-02-22: qty 1

## 2021-02-22 MED ORDER — SENNOSIDES-DOCUSATE SODIUM 8.6-50 MG PO TABS
2.0000 | ORAL_TABLET | Freq: Two times a day (BID) | ORAL | Status: DC
  Administered 2021-02-22 – 2021-02-24 (×4): 2 via ORAL
  Filled 2021-02-22 (×6): qty 2

## 2021-02-22 MED ORDER — OXYCODONE-ACETAMINOPHEN 7.5-325 MG PO TABS
1.0000 | ORAL_TABLET | ORAL | Status: DC | PRN
  Filled 2021-02-22: qty 1

## 2021-02-22 NOTE — Progress Notes (Signed)
Mobility Specialist: Progress Note   02/22/21 1104  Mobility  Activity Ambulated in hall  Level of Assistance Modified independent, requires aide device or extra time  Assistive Device Front wheel walker  Distance Ambulated (ft) 320 ft  Mobility Ambulated with assistance in hallway  Mobility Response Tolerated well  Mobility performed by Mobility specialist  Bed Position Chair  $Mobility charge 1 Mobility   Pre-Mobility: 83 HR, 99% SpO2 During Mobility: 104 HR Post-Mobility: 89 HR, 156/75 BP, 100% SpO2  Pt c/o RLE feeling numb during ambulation this morning, mostly in his R calf. Pt otherwise had no c/o throughout. Pt to recliner after walk with call bell and phone at his side.   [facility] Cristol Engdahl Mobility Specialist Mobility Specialist Phone #1: [phone] Mobility Specialist Phone #2: [phone]

## 2021-02-22 NOTE — Progress Notes (Addendum)
Progress Note    02/22/2021 7:39 AM 4 Days Post-Op  Subjective: Complaining of some mild postoperative right lower extremity weakness.  No significant pain.  Tolerating clear liquids.  He has ambulated short distances in his room.   Vitals:   02/22/21 0435 02/22/21 0508  BP:  (!) 155/82  Pulse:    Resp: 17   Temp:  98.4 F (36.9 C)  SpO2: 95%     Physical Exam: General appearance: Awake, alert in no apparent distress Cardiac: Heart rate and rhythm are regular Respirations: Nonlabored Abdomen: midline incision well approximated. Non-distended. Active bowel sounds. Incisions: Right/Left groin incisions are all well approximated without bleeding or hematoma Extremities: Both feet are warm with intact sensation.  Mild RLE edema. 4 out of 5 plantar flexion and dorsiflexion of the right foot and ankle.  Right first and fifth toes ischemic changes noted and unchanged. Pulse/Doppler exam: Palpable right DP pulse.    CBC    Component Value Date/Time   WBC 6.8 02/22/2021 0255   RBC 2.88 (L) 02/22/2021 0255   HGB 8.4 (L) 02/22/2021 0255   HGB 13.2 08/16/2018 1406   HCT 25.3 (L) 02/22/2021 0255   HCT 37.1 (L) 08/16/2018 1406   PLT 299 02/22/2021 0255   PLT 332 08/16/2018 1406   MCV 87.8 02/22/2021 0255   MCV 87 08/16/2018 1406   MCH 29.2 02/22/2021 0255   MCHC 33.2 02/22/2021 0255   RDW 12.8 02/22/2021 0255   RDW 13.2 08/16/2018 1406   LYMPHSABS 2.4 12/31/2020 1949   MONOABS 0.5 12/31/2020 1949   EOSABS 0.2 12/31/2020 1949   BASOSABS 0.1 12/31/2020 1949    BMET    Component Value Date/Time   NA 137 02/22/2021 0255   NA 135 03/18/2020 0920   K 4.1 02/22/2021 0255   CL 110 02/22/2021 0255   CO2 21 (L) 02/22/2021 0255   GLUCOSE 184 (H) 02/22/2021 0255   BUN 10 02/22/2021 0255   BUN 28 (H) 03/18/2020 0920   CREATININE 0.89 02/22/2021 0255   CALCIUM 7.9 (L) 02/22/2021 0255   GFRNONAA >60 02/22/2021 0255   GFRAA 55 (L) 03/18/2020 0920     Intake/Output Summary  (Last 24 hours) at 02/22/2021 0739 Last data filed at 02/22/2021 0622 Gross per 24 hour  Intake 1766.17 ml  Output 1250 ml  Net 516.17 ml    HOSPITAL MEDICATIONS Scheduled Meds:  amLODipine  5 mg Oral Daily   aspirin EC  81 mg Oral Daily   atorvastatin  80 mg Oral Daily   Chlorhexidine Gluconate Cloth  6 each Topical Daily   fenofibrate  54 mg Oral Daily   heparin injection (subcutaneous)  5,000 Units Subcutaneous Q8H   HYDROmorphone   Intravenous Q4H   influenza vac split quadrivalent PF  0.5 mL Intramuscular Tomorrow-1000   insulin aspart  0-9 Units Subcutaneous TID WC   lidocaine  1 patch Transdermal Q24H   mouth rinse  15 mL Mouth Rinse BID   metoprolol succinate  50 mg Oral QHS   pantoprazole (PROTONIX) IV  40 mg Intravenous Q24H   pregabalin  100 mg Oral TID   tamsulosin  0.4 mg Oral Daily   Continuous Infusions:  0.9 % NaCl with KCl 20 mEq / L 75 mL/hr at 02/21/21 1512   PRN Meds:.acetaminophen **OR** acetaminophen, alum & mag hydroxide-simeth, diphenhydrAMINE **OR** diphenhydrAMINE, labetalol, metoprolol tartrate, naloxone **AND** sodium chloride flush, ondansetron, potassium chloride  Assessment and Plan: -POD 4 aortobifemoral bypass. VSS.  No evidence of malperfusion.  He is tolerating clear liquids.  Continue mobilization.  Discontinue PCA and begin oral pain control medication.  He is back on his home medications and aspirin and statin continue.  Acute BLA: Hgb improving  Dispo: home with HHPT. Consult TOC.  -DVT prophylaxis:  heparin Mora; SCDs    Wendi Maya, PA-C [facility] [phone] 02/22/2021  7:39 AM    VASCULAR STAFF ADDENDUM: I have independently interviewed and examined the patient. I agree with the above.  Doing great, palpable pulses Clears, no BM - will advance once pt has BM Ordered bowel regimen OOB, PT D/c PCA   Fara Olden, MD [facility] of [facility] Phone Number: [phone] 02/22/2021 7:59 AM

## 2021-02-22 NOTE — Progress Notes (Signed)
Inpatient Diabetes Program Recommendations  AACE/ADA: New Consensus Statement on Inpatient Glycemic Control (2015)  Target Ranges:  Prepandial:   less than 140 mg/dL      Peak postprandial:   less than 180 mg/dL (1-2 hours)      Critically ill patients:  140 - 180 mg/dL   Lab Results  Component Value Date   GLUCAP 181 (H) 02/22/2021   HGBA1C 10.7 (H) 02/15/2021    Review of Glycemic Control  Latest Reference Range & Units 02/21/21 06:19 02/21/21 12:13 02/21/21 15:54 02/21/21 20:49 02/22/21 09:00 02/22/21 12:08  Glucose-Capillary 70 - 99 mg/dL 270 (H) 350 (H) 093 (H) 169 (H) 190 (H) 181 (H)   Diabetes history: DM2 Outpatient Diabetes medications: Lantus 70 units qd, Novolog 10 units bid ac meals, Metformin 1 gm bid Current orders for Inpatient glycemic control: Novolog 0-9 units tid ac meals  Inpatient Diabetes Program Recommendations:   Spoke with patient regarding diabetes management @ home. Patient states he has been taking his long and short acting insulin along with Metformin. Discussed with him the risks of elevated CBGs with vascular disease and patient acknowledges understanding. Patient shared that his CBGs @ home have been running 200's-300's. Patient just started seeing a new PCP who is managing his diabetes medications. Reviewed with patient importance of checking CBGs, taking medication as prescribed, limiting sugary drinks and foods along with limiting carbohydrates. Patient states bread is his weakness and has been eating more bread than usual. Reviewed A1c is 10.7 (average blood glucose 260 over the past 2-3 months). Ordered book Living Well with Diabetes for patient to review.  Thank you, Billy Fischer. Aulden Calise, RN, MSN, CDE  Diabetes Coordinator Inpatient Glycemic Control Team Team Pager [phone] (8am-5pm) 02/22/2021 1:06 PM

## 2021-02-22 NOTE — TOC Initial Note (Addendum)
Transition of Care [facility]) - Initial/Assessment Note    Patient Details  Name: Dylan Anderson MRN: [id number] Date of Birth: [birth year]  Transition of Care [facility]) CM/SW Contact:    Bess Kinds, RN Phone Number: [phone] 02/22/2021, 3:05 PM  Clinical Narrative:                  Spoke with patient and spouse at the bedside to discuss transition planning. PTA home with wife. Has a shower chair at home. Demographics verified.   Discussed recommendations for DME - RW and 3/1. Referral to [employer] for delivery to the room.   Discussed recommendations for [facility] PT. Discussed barriers for finding an in network accepting home health agency. Referral to [facility] - declined. Referral to [facility] - pending. Patient is agreeable to outpatient PT in [facility] if unable to get [facility].   Spouse to provide transportation home at discharge.   TOC following for transition needs.   Update 4:30pm: Referral for [facility] PT accepted by [facility].   Expected Discharge Plan: Home w Home Health Services Barriers to Discharge: Continued Medical Work up   Patient Goals and CMS Choice Patient states their goals for this hospitalization and ongoing recovery are:: return home with wife CMS Medicare.gov Compare Post Acute Care list provided to:: Patient Choice offered to / list presented to : Patient, Spouse  Expected Discharge Plan and Services Expected Discharge Plan: Home w Home Health Services   Discharge Planning Services: CM Consult Post Acute Care Choice: Home Health, Durable Medical Equipment Living arrangements for the past 2 months: Single Family Home                 DME Arranged: 3-N-1, Walker rolling DME Agency: [employer] Date DME Agency Contacted: 02/22/21 Time DME Agency Contacted: [phone] Representative spoke with at DME Agency: Velna Hatchet            Prior Living Arrangements/Services Living arrangements for the past 2 months: Single Family Home Lives with:: Self,  Spouse Patient language and need for interpreter reviewed:: Yes        Need for Family Participation in Patient Care: Yes (Comment) Care giver support system in place?: Yes (comment) Current home services: DME (shower stool) Criminal Activity/Legal Involvement Pertinent to Current Situation/Hospitalization: No - Comment as needed  Activities of Daily Living      Permission Sought/Granted      Share Information with NAME: Dylan Anderson     Permission granted to share info w Relationship: wife  Permission granted to share info w Contact Information: [phone]  Emotional Assessment Appearance:: Appears stated age Attitude/Demeanor/Rapport: Engaged Affect (typically observed): Accepting Orientation: : Oriented to Self, Oriented to Place, Oriented to  Time, Oriented to Situation Alcohol / Substance Use: Not Applicable Psych Involvement: No (comment)  Admission diagnosis:  S/P aortobifemoral bypass surgery [Z95.828] 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 (HCC) [I70.223] Patient Active Problem List   Diagnosis Date Noted   S/P aortobifemoral bypass surgery 02/18/2021   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 (HCC) 02/18/2021   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   Critical limb ischemia of both lower extremities (HCC) 02/09/2021   Type 2 diabetes mellitus (HCC)    PVD (peripheral vascular disease) (HCC)    Arthritis    Back pain    Dizzy 09/21/2018   Mild CAD 07/08/2018   Abnormal cardiac CT angiography    PVC's (premature ventricular contractions) 05/28/2018   Chest pain in adult 05/26/2018  Claudication (HCC) 11/05/2017   Acute gout involving toe of left foot 09/21/2017   Chronic cough 07/13/2017   Healthcare maintenance 07/12/2017   Peripheral artery disease (HCC) 06/07/2017   Hip pain 05/20/2017   Gastroesophageal reflux disease 04/27/2017   Insomnia 02/06/2017   Hyperlipidemia 01/25/2017   Erectile dysfunction 01/25/2017    Low back pain 01/25/2017   Diabetes mellitus type II, non insulin dependent (HCC) 07/10/2012   Hypertension 07/10/2012   Tobacco abuse 07/10/2012   PCP:  Nechama Guard, FNP Pharmacy:   [id number] [place], [place] - [address] [place] [address] Phone: [phone] Fax: [phone]  [facility] [address] [place] [address] Phone: [phone] Fax: [phone]     Social Determinants of Health (SDOH) Interventions    Readmission Risk Interventions No flowsheet data found.

## 2021-02-23 LAB — GLUCOSE, CAPILLARY
Glucose-Capillary: 238 mg/dL — ABNORMAL HIGH (ref 70–99)
Glucose-Capillary: 204 mg/dL — ABNORMAL HIGH (ref 70–99)
Glucose-Capillary: 113 mg/dL — ABNORMAL HIGH (ref 70–99)
Glucose-Capillary: 329 mg/dL — ABNORMAL HIGH (ref 70–99)

## 2021-02-23 MED ORDER — PANTOPRAZOLE SODIUM 40 MG PO TBEC
40.0000 mg | DELAYED_RELEASE_TABLET | Freq: Every day | ORAL | Status: DC
  Administered 2021-02-23 – 2021-02-25 (×3): 40 mg via ORAL
  Filled 2021-02-23 (×3): qty 1

## 2021-02-23 MED ORDER — PANTOPRAZOLE SODIUM 40 MG PO TBEC: 40.0000 mg | DELAYED_RELEASE_TABLET | Freq: Every day | ORAL | Status: DC

## 2021-02-23 MED FILL — Sodium Chloride IV Soln 0.9%: INTRAVENOUS | Qty: 1000 | Status: AC

## 2021-02-23 MED FILL — Heparin Sodium (Porcine) Inj 1000 Unit/ML: INTRAMUSCULAR | Qty: 30 | Status: AC

## 2021-02-23 NOTE — Progress Notes (Addendum)
Inpatient Diabetes Program Recommendations  AACE/ADA: New Consensus Statement on Inpatient Glycemic Control (2015)  Target Ranges:  Prepandial:   less than 140 mg/dL      Peak postprandial:   less than 180 mg/dL (1-2 hours)      Critically ill patients:  140 - 180 mg/dL   Lab Results  Component Value Date   GLUCAP 238 (H) 02/23/2021   HGBA1C 10.7 (H) 02/15/2021    Review of Glycemic Control  Latest Reference Range & Units 02/22/21 09:00 02/22/21 12:08 02/22/21 16:43 02/22/21 20:19 02/23/21 06:29  Glucose-Capillary 70 - 99 mg/dL 485 (H) 462 (H) 703 (H) 260 (H) 238 (H)   Diabetes history: DM2 Outpatient Diabetes medications: Lantus 70 units qd, Novolog 10 units bid ac meals, Metformin 1 gm bid Current orders for Inpatient glycemic control: Novolog 0-9 units tid ac meals  Inpatient Diabetes Program Recommendations:   Please consider: -Semglee 14 units qd (0.2 units/g x 6805 kg) -Add Novolog 2 units tid meal coverage if eats 50% meals  Thank you, Darel Hong E. Arlind Klingerman, RN, MSN, CDE  Diabetes Coordinator Inpatient Glycemic Control Team Team Pager [phone] (8am-5pm) 02/23/2021 11:39 AM

## 2021-02-23 NOTE — Progress Notes (Signed)
Physical Therapy Treatment & Discharge Patient Details Name: Dylan Anderson MRN: [id number] DOB: December 26, 1960 Today's Date: 02/23/2021   History of Present Illness Pt is a 60 y.o. male presenting 02/18/21 for aortobifemoral bypass due to critical limb ischemia and tissue loss. PMH includes HTN, HLD, DM II, CAD, PVD, prior bilateral common iliac stents (8-9 years ago and again in 06/2020).   PT Comments    Pt progressing with mobility. Today's session focused on gait training with and without RW; pt moving well with intermittent supervision for safety. Reviewed educ re: DME needs, fall risk reduction, activity recommendations. Pt reports no further questions or concerns, preparing for d/c home tomorrow. Will d/c acute PT.    Recommendations for follow up therapy are one component of a multi-disciplinary discharge planning process, led by the attending physician.  Recommendations may be updated based on patient status, additional functional criteria and insurance authorization.  Follow Up Recommendations  Home health PT     Assistance Recommended at Discharge Intermittent Supervision/Assistance  Equipment Recommendations  None recommended by PT (delivered)    Recommendations for Other Services       Precautions / Restrictions Precautions Precautions: Fall Restrictions Weight Bearing Restrictions: No     Mobility  Bed Mobility Overal bed mobility: Modified Independent Bed Mobility: Supine to Sit     Supine to sit: Modified independent (Device/Increase time)     General bed mobility comments: Received sitting EOB with OT    Transfers Overall transfer level: Independent Equipment used: None Transfers: Sit to/from Stand Sit to Stand: Modified independent (Device/Increase time)           General transfer comment: Able to stand with and without RW    Ambulation/Gait Ambulation/Gait assistance: Supervision Gait Distance (Feet): 500 Feet Assistive device: Rolling walker  (2 wheels);None Gait Pattern/deviations: Step-through pattern;Decreased stride length       General Gait Details: Pt opting to ambulate with RW for comfort, but holding back legs of walker up majority of walk, cued for safety on potentially not needing but pt reports preference to hold for comfort "for longer distances"; pt able to ambulate additional distance without RW, supervision for safety   Stairs Stairs:  (pt declined, reports one step into home and also has ramp)           Wheelchair Mobility    Modified Rankin (Stroke Patients Only)       Balance Overall balance assessment: Needs assistance Sitting-balance support: No upper extremity supported;Feet supported Sitting balance-Leahy Scale: Good     Standing balance support: No upper extremity supported;Bilateral upper extremity supported;During functional activity;Reliant on assistive device for balance Standing balance-Leahy Scale: Fair Standing balance comment: can static stand and ambulate without DME; stability improved with UE support                            Cognition Arousal/Alertness: Awake/alert Behavior During Therapy: WFL for tasks assessed/performed Overall Cognitive Status: Within Functional Limits for tasks assessed                                 General Comments: benefits from some safety cues but overall [facility]        Exercises      General Comments General comments (skin integrity, edema, etc.): pt preparing for d/c home tomorrow - educ re: fall risk reduction (continued discussion from OT session), DME recommendations, activity recommendations, importance  of mobility; pt reports indep with therex      Pertinent Vitals/Pain Pain Assessment: Faces Faces Pain Scale: Hurts a little bit Pain Location: Abdomen Pain Descriptors / Indicators: Sore Pain Intervention(s): Monitored during session    Home Living                          Prior Function             PT Goals (current goals can now be found in the care plan section) Progress towards PT goals: Progressing toward goals    Frequency    Min 3X/week      PT Plan Current plan remains appropriate    Co-evaluation              AM-PAC PT "6 Clicks" Mobility   Outcome Measure  Help needed turning from your back to your side while in a flat bed without using bedrails?: None Help needed moving from lying on your back to sitting on the side of a flat bed without using bedrails?: None Help needed moving to and from a bed to a chair (including a wheelchair)?: A Little Help needed standing up from a chair using your arms (e.g., wheelchair or bedside chair)?: A Little Help needed to walk in hospital room?: A Little Help needed climbing 3-5 steps with a railing? : A Little 6 Click Score: 20    End of Session   Activity Tolerance: Patient tolerated treatment well Patient left: in chair;with call bell/phone within reach Nurse Communication: Mobility status PT Visit Diagnosis: Unsteadiness on feet (R26.81);Other abnormalities of gait and mobility (R26.89);Muscle weakness (generalized) (M62.81);History of falling (Z91.81)     Time: 5038-8828 PT Time Calculation (min) (ACUTE ONLY): 10 min  Charges:  $Gait Training: 8-22 mins                     Ina Homes, PT, DPT Acute Rehabilitation Services  Pager [phone] Office [phone]  Malachy Chamber 02/23/2021, 1:26 PM

## 2021-02-23 NOTE — Progress Notes (Addendum)
  Progress Note    02/23/2021 7:38 AM 5 Days Post-Op  Subjective:  R leg weakness but able to walk the halls   Vitals:   02/22/21 2315 02/23/21 0313  BP: (!) 143/81 129/65  Pulse: 75 70  Resp: 18 18  Temp: 97.6 F (36.4 C) 97.7 F (36.5 C)  SpO2: 99% 99%   Physical Exam: Lungs:  non labored Incisions:  abd and groin incisions c/d/i Extremities:  feet warm and well perfused with palpable DP pulses L >R Neurologic: A&O  CBC    Component Value Date/Time   WBC 6.8 02/22/2021 0255   RBC 2.88 (L) 02/22/2021 0255   HGB 8.4 (L) 02/22/2021 0255   HGB 13.2 08/16/2018 1406   HCT 25.3 (L) 02/22/2021 0255   HCT 37.1 (L) 08/16/2018 1406   PLT 299 02/22/2021 0255   PLT 332 08/16/2018 1406   MCV 87.8 02/22/2021 0255   MCV 87 08/16/2018 1406   MCH 29.2 02/22/2021 0255   MCHC 33.2 02/22/2021 0255   RDW 12.8 02/22/2021 0255   RDW 13.2 08/16/2018 1406   LYMPHSABS 2.4 12/31/2020 1949   MONOABS 0.5 12/31/2020 1949   EOSABS 0.2 12/31/2020 1949   BASOSABS 0.1 12/31/2020 1949    BMET    Component Value Date/Time   NA 137 02/22/2021 0255   NA 135 03/18/2020 0920   K 4.1 02/22/2021 0255   CL 110 02/22/2021 0255   CO2 21 (L) 02/22/2021 0255   GLUCOSE 184 (H) 02/22/2021 0255   BUN 10 02/22/2021 0255   BUN 28 (H) 03/18/2020 0920   CREATININE 0.89 02/22/2021 0255   CALCIUM 7.9 (L) 02/22/2021 0255   GFRNONAA >60 02/22/2021 0255   GFRAA 55 (L) 03/18/2020 0920    INR    Component Value Date/Time   INR 1.1 02/18/2021 1312     Intake/Output Summary (Last 24 hours) at 02/23/2021 1062 Last data filed at 02/23/2021 0600 Gross per 24 hour  Intake 720 ml  Output 1500 ml  Net -780 ml     Assessment/Plan:  60 y.o. male is s/p ABF  5 Days Post-Op   BLE well perfused on exam Abd and groin incisions unremarkable BM last night and this morning; tolerating regular diet HH PT has been arranged D/c home likely tomorrow   Emilie Rutter, PA-C [facility] [phone] 02/23/2021 7:38 AM  VASCULAR STAFF ADDENDUM: I have independently interviewed and examined the patient. I agree with the above.  Progressing appropriately. Palpable pulses. Regular diet. Home tomorrow pending PT clearance  Fara Olden, MD [facility] Phone Number: [phone] 02/23/2021 7:51 AM

## 2021-02-23 NOTE — Progress Notes (Signed)
Occupational Therapy Treatment Patient Details Name: Dylan Anderson MRN: [id number] DOB: 09-Dec-1960 Today's Date: 02/23/2021   History of present illness The pt is a 60 yo male presenting 11/17 for aortobifemoral bypass due to critical limb ischemia and tissue loss. PMH includes: HTN, HLD, DM II, CAD, PVD, prior bilateral common iliac stents (8-9 years ago and again in 06/2020).   OT comments  Pt with excellent progress towards OT goals during session. Pt able to demo LB ADLs and in-room mobility without physical assist when using RW. Pt reports mobilizing to/from bathroom without assist. Guided pt in dynamic balance challenges with noted LOB requiring Min A to correct when bending to retrieve items from floor without DME. Improved safety noted when using at least one UE support for similar tasks. Reinforced fall prevention strategies with handout provided. Based on presentation today, no OT services needed at DC. Pt endorses family can assist with IADLs as needed.    Recommendations for follow up therapy are one component of a multi-disciplinary discharge planning process, led by the attending physician.  Recommendations may be updated based on patient status, additional functional criteria and insurance authorization.    Follow Up Recommendations  No OT follow up    Assistance Recommended at Discharge Intermittent Supervision/Assistance  Equipment Recommendations  Other (comment) (Rolling walker)    Recommendations for Other Services      Precautions / Restrictions Precautions Precautions: Fall Restrictions Weight Bearing Restrictions: No       Mobility Bed Mobility Overal bed mobility: Modified Independent Bed Mobility: Supine to Sit     Supine to sit: Modified independent (Device/Increase time)          Transfers Overall transfer level: Modified independent Equipment used: None;Rolling walker (2 wheels) Transfers: Sit to/from Stand Sit to Stand: Modified independent  (Device/Increase time)                 Balance Overall balance assessment: Needs assistance Sitting-balance support: No upper extremity supported;Feet supported Sitting balance-Leahy Scale: Good     Standing balance support: Bilateral upper extremity supported;During functional activity Standing balance-Leahy Scale: Fair Standing balance comment: fair static standing, LOB when bending to pick up item from floor without AD                           ADL either performed or assessed with clinical judgement   ADL Overall ADL's : Needs assistance/impaired Eating/Feeding: Independent;Sitting                   Lower Body Dressing: Set up;Sit to/from stand Lower Body Dressing Details (indicate cue type and reason): able to demo ability to cross B LE to reach socks effectively; unable to bend to feet due to abdominal incision/discomfort             Functional mobility during ADLs: Supervision/safety;Rolling walker (2 wheels) General ADL Comments: Pt with significant progress since eval and transfer out of ICU. reports mobilizing to/from bathroom without assist and was able to sponge bathe with setup. pt with noted deficits with dynamic standing balance deficits without AD. When reaching to pick up item from floor, pt with LOB and Min A to correct; able to complete task using RW and instruction for one UE support to bend to floor without LOB. Provided fall prevention handout    Extremity/Trunk Assessment Upper Extremity Assessment Upper Extremity Assessment: LUE deficits/detail LUE Deficits / Details: reports new shoulder pain since fall at home, flexion to  90* unable to lift further due to pain (RN aware on eval) LUE Coordination: decreased gross motor   Lower Extremity Assessment Lower Extremity Assessment: Defer to PT evaluation        Vision   Vision Assessment?: No apparent visual deficits   Perception     Praxis      Cognition Arousal/Alertness:  Awake/alert Behavior During Therapy: WFL for tasks assessed/performed Overall Cognitive Status: Within Functional Limits for tasks assessed                                 General Comments: benefits from some safety cues but overall [facility]          Exercises     Shoulder Instructions       General Comments HR WFL    Pertinent Vitals/ Pain       Pain Assessment: Faces Faces Pain Scale: Hurts a little bit Pain Location: abdomen with trunk flexion Pain Descriptors / Indicators: Grimacing Pain Intervention(s): Monitored during session  Home Living                                          Prior Functioning/Environment              Frequency  Min 2X/week        Progress Toward Goals  OT Goals(current goals can now be found in the care plan section)  Progress towards OT goals: Progressing toward goals  Acute Rehab OT Goals Patient Stated Goal: go home tomorrow OT Goal Formulation: With patient Time For Goal Achievement: 03/05/21 Potential to Achieve Goals: Good ADL Goals Pt Will Perform Grooming: with set-up;standing Pt Will Perform Lower Body Bathing: with min guard assist;sit to/from stand Pt Will Perform Lower Body Dressing: with min guard assist;sit to/from stand Pt Will Transfer to Toilet: with min guard assist;ambulating Additional ADL Goal #1: Pt to verbalize at least 3 fall prevention strategies to implement at home  Plan Discharge plan needs to be updated    Co-evaluation                 AM-PAC OT "6 Clicks" Daily Activity     Outcome Measure   Help from another person eating meals?: None Help from another person taking care of personal grooming?: A Little Help from another person toileting, which includes using toliet, bedpan, or urinal?: A Little Help from another person bathing (including washing, rinsing, drying)?: A Little Help from another person to put on and taking off regular upper body clothing?: A  Little Help from another person to put on and taking off regular lower body clothing?: A Little 6 Click Score: 19    End of Session Equipment Utilized During Treatment: Rolling walker (2 wheels)  OT Visit Diagnosis: Unsteadiness on feet (R26.81);Other abnormalities of gait and mobility (R26.89);Muscle weakness (generalized) (M62.81);Pain Pain - part of body:  (abdomen)   Activity Tolerance Patient tolerated treatment well   Patient Left in bed;with call bell/phone within reach;Other (comment) (with PT entering)   Nurse Communication Mobility status        Time: 8921-1941 OT Time Calculation (min): 15 min  Charges: OT General Charges $OT Visit: 1 Visit OT Treatments $Therapeutic Activity: 8-22 mins  Bradd Canary, OTR/L Acute Rehab Services Office: [phone]   Lorre Munroe 02/23/2021, 12:27 PM

## 2021-02-23 NOTE — Progress Notes (Addendum)
Mobility Specialist: Progress Note   02/23/21 1559  Mobility  Activity Ambulated in hall  Level of Assistance Independent  Distance Ambulated (ft) 470 ft  Mobility Ambulated independently in hallway  Mobility Response Tolerated well  Mobility performed by Mobility specialist  Bed Position Chair  $Mobility charge 1 Mobility   Pre-Mobility: 73 HR Post-Mobility: 77 HR  Pt c/o R hip pain that radiated down his leg during ambulation, otherwise no c/o. Pt back to chair after walk with call bell and phone in reach.   [name] Dylan Anderson Mobility Specialist Mobility Specialist Phone #1: [phone] Mobility Specialist Phone #2: [phone]

## 2021-02-24 ENCOUNTER — Other Ambulatory Visit: Payer: Self-pay

## 2021-02-24 LAB — GLUCOSE, CAPILLARY
Glucose-Capillary: 249 mg/dL — ABNORMAL HIGH (ref 70–99)
Glucose-Capillary: 145 mg/dL — ABNORMAL HIGH (ref 70–99)
Glucose-Capillary: 170 mg/dL — ABNORMAL HIGH (ref 70–99)
Glucose-Capillary: 213 mg/dL — ABNORMAL HIGH (ref 70–99)

## 2021-02-24 NOTE — Progress Notes (Signed)
Mobility Specialist: Progress Note   02/24/21 1037  Mobility  Activity Ambulated in hall  Level of Assistance Independent  Distance Ambulated (ft) 650 ft  Mobility Ambulated independently in hallway  Mobility Response Tolerated well  Mobility performed by Mobility specialist  Bed Position Chair  $Mobility charge 1 Mobility   Pre-Mobility: 77 HR Post-Mobility: 87 HR  Pt in BR upon entering room but agreeable to ambulation after. Pt c/o R hip stiffness during ambulation, otherwise no c/o. Pt to recliner after walk with call bell and phone in reach.   [facility] Dylan Anderson Mobility Specialist Mobility Specialist Phone #1: [phone] Mobility Specialist Phone #2: [phone]

## 2021-02-24 NOTE — Discharge Instructions (Signed)
 [facility] [place]  Discharge Instructions   Open Aortic Surgery  Please refer to the following instructions for your post-procedure care. Your surgeon or Physician Assistant will discuss any changes with you.  Activity  Avoid lifting more than eight pounds (a gallon of milk) until after your first post-operative visit. You are encouraged to walk as much as you can. You can slowly return to normal activities but must avoid strenuous activity and heavy lifting until your doctor tells you it's okay. Heavy lifting can hurt the incision and cause a hernia. Avoid activities such as vacuuming or swinging a golf club. It is normal to feel tired for several weeks after your surgery. Do not drive until your doctor gives the okay and you are no longer taking prescription pain medications. It is also normal to have difficulty with sleep habits, eating and bowl movements after surgery. These will go away with time.  Bathing/Showering  Shower daily after you go home. Do not soak in a bathtub, hot tub, or swim until the incision heals.  Incision Care  Shower every day. Clean your incision with mild soap and water. Pat the area dry with a clean towel. You do not need a bandage unless otherwise instructed. Do not apply any ointments or creams to your incision. You may have skin glue on your incision. Do not peel it off. It will come off on its own in about one week. If you have staples or sutures along your incision, they will be removed at your post op appointment.  If you have groin incisions, wash the groin wounds with soap and water daily and pat dry. (No tub bath-only shower)  Then put a dry gauze or washcloth in the groin to keep this area dry to help prevent wound infection.  Do this daily and as needed.  Do not use Vaseline or neosporin on your incisions.  Only use soap and water on your incisions and then protect and keep dry.  Diet  Resume your normal diet. There are no  special food restriction following this procedure. A low fat/low cholesterol diet is recommended for all patients with vascular disease. After your aortic surgery, it's normal to feel full faster than usual and to not feel as hungry as you normally would. You will probably lose weight initially following your surgery. It's best to eat small, frequent meals over the course of the day. Call the office if you find that you are unable to eat even small meals.   In order to heal from your surgery, it is CRITICAL to get adequate nutrition. Your body requires vitamins, minerals, and protein. Vegetables are the best source of vitamins and minerals. If you have pain, you may take over-the-counter pain reliever such as acetaminophen (Tylenol). If you were prescribed a stronger pain medication, please be aware these medication can cause nausea and constipation. Prevent nausea by taking the medication with a snack or meal. Avoid constipation by drinking plenty of fluids and eating foods with a high amount of fiber, such as fruits, vegetables and grains. Take 100mg of the over-the-counter stool softener Colace twice a day as needed to help with constipation. A laxative, such as Milk of Magnesia, may be recommended for you at this time. Do not take a laxative unless your surgeon or P.A. tells you it's OK.  Do not take Tylenol if you are taking stronger pain medications (such as Percocet).  Follow Up  Our office will schedule a follow up   appointment 2-3 weeks after discharge.  Please call us immediately for any of the following conditions    .     Severe or worsening pain in your legs or feet or in your abdomen back or chest. Increased pain, redness drainage (pus) from your incision site. Increased abdominal pain, bloating, nausea, vomiting, or persistent diarrhea. Fever of 101 degrees or higher. Swelling in your leg (s).  Reduce your risk of vascular disease  Stop smoking. If you would like help, call  [facility] at 1-800-QUIT-NOW (1-800-784-8669) or [place] at 336-586-4000. Manage your cholesterol Maintain a desired weight Control your diabetes Keep your blood pressure down  If you have any questions please call the office at [phone].   

## 2021-02-24 NOTE — Progress Notes (Signed)
Pt was offered flu vaccine, but refused. Stated it had made him sick before and he would rather wait another week before getting the vaccine. Pt was educated on the importance of the vaccination, especially since his wife is home with the flu. Pt states he understands the risks.  Brooke Pace, RN

## 2021-02-24 NOTE — Progress Notes (Addendum)
  Progress Note    02/24/2021 7:48 AM 6 Days Post-Op  Subjective:  still complaining of some right hip pain with some radiation down leg on ambulation, also having burning pain in right foot. Says it feels like a sun burn to touch   Vitals:   02/23/21 2341 02/24/21 0356  BP: (!) 143/77 (!) 144/75  Pulse: 80 69  Resp: 12 10  Temp: 98.6 F (37 C) 98.4 F (36.9 C)  SpO2: 97% 98%   Physical Exam: Cardiac:  regular Lungs:  non labored Incisions:  laparotomy and B groins intact and healing nicely, c/d/i Extremities:  2+ femoral pulses bilaterally, palpable DP bilaterally. Bilateral lower extremities edematous Abdomen:  soft, non distended Neurologic: alert and oriented  CBC    Component Value Date/Time   WBC 6.8 02/22/2021 0255   RBC 2.88 (L) 02/22/2021 0255   HGB 8.4 (L) 02/22/2021 0255   HGB 13.2 08/16/2018 1406   HCT 25.3 (L) 02/22/2021 0255   HCT 37.1 (L) 08/16/2018 1406   PLT 299 02/22/2021 0255   PLT 332 08/16/2018 1406   MCV 87.8 02/22/2021 0255   MCV 87 08/16/2018 1406   MCH 29.2 02/22/2021 0255   MCHC 33.2 02/22/2021 0255   RDW 12.8 02/22/2021 0255   RDW 13.2 08/16/2018 1406   LYMPHSABS 2.4 12/31/2020 1949   MONOABS 0.5 12/31/2020 1949   EOSABS 0.2 12/31/2020 1949   BASOSABS 0.1 12/31/2020 1949    BMET    Component Value Date/Time   NA 137 02/22/2021 0255   NA 135 03/18/2020 0920   K 4.1 02/22/2021 0255   CL 110 02/22/2021 0255   CO2 21 (L) 02/22/2021 0255   GLUCOSE 184 (H) 02/22/2021 0255   BUN 10 02/22/2021 0255   BUN 28 (H) 03/18/2020 0920   CREATININE 0.89 02/22/2021 0255   CALCIUM 7.9 (L) 02/22/2021 0255   GFRNONAA >60 02/22/2021 0255   GFRAA 55 (L) 03/18/2020 0920    INR    Component Value Date/Time   INR 1.1 02/18/2021 1312     Intake/Output Summary (Last 24 hours) at 02/24/2021 0748 Last data filed at 02/24/2021 0154 Gross per 24 hour  Intake 0 ml  Output --  Net 0 ml     Assessment/Plan:  60 y.o. male is s/p ABF 6 Days  Post-Op   Doing well post op Lower extremities well perfused and warm Incisions are all well appearing and healing nicely BM this morning Continues to tolerate diet HH PT arranged, no further recommendations Continue IS Mobilize as tolerated Not sure if he is ready to go home today- wife may have flu. Has to talk to his daughter today about going to stay with her for a couple days Possible discharge later today vs next 1-2 days   Graceann Congress, [place] [facility] [phone] 02/24/2021 7:48 AM  VASCULAR STAFF ADDENDUM: I have independently interviewed and examined the patient. I agree with the above.  Home pending safe discharge. Wife with Flu. Needs to go to daughters house if available   J. Gillis Santa, MD [facility] Phone Number: [phone] 02/24/2021 12:31 PM

## 2021-02-24 NOTE — Plan of Care (Signed)
  Problem: Health Behavior/Discharge Planning: Goal: Ability to manage health-related needs will improve Outcome: Progressing   Problem: Clinical Measurements: Goal: Will remain free from infection Outcome: Progressing Goal: Diagnostic test results will improve Outcome: Progressing Goal: Respiratory complications will improve Outcome: Progressing Goal: Cardiovascular complication will be avoided Outcome: Progressing   

## 2021-02-24 NOTE — Progress Notes (Signed)
Mobility Specialist: Progress Note   02/24/21 1528  Mobility  Activity Ambulated in hall  Level of Assistance Independent  Assistive Device None  Distance Ambulated (ft) 900 ft  Mobility Ambulated with assistance in hallway  Mobility Response Tolerated well  Mobility performed by Mobility specialist  $Mobility charge 1 Mobility   During Mobility: 110 HR Post-Mobility: 96 HR  Pt c/o RLE stiffness during ambulation, otherwise no c/o. Pt sitting EOB after walk with call bell and phone in reach.   [facility] Jacquelyn Antony Mobility Specialist Mobility Specialist Phone #1: [phone] Mobility Specialist Phone #2: [phone]

## 2021-02-25 LAB — GLUCOSE, CAPILLARY: Glucose-Capillary: 213 mg/dL — ABNORMAL HIGH (ref 70–99)

## 2021-02-25 MED ORDER — OXYCODONE-ACETAMINOPHEN 5-325 MG PO TABS: 1.0000 | ORAL_TABLET | ORAL | 0 refills | Status: DC | PRN

## 2021-02-25 NOTE — Plan of Care (Signed)
  Problem: Health Behavior/Discharge Planning: Goal: Ability to manage health-related needs will improve Outcome: Progressing   Problem: Clinical Measurements: Goal: Ability to maintain clinical measurements within normal limits will improve Outcome: Progressing Goal: Will remain free from infection Outcome: Progressing Goal: Diagnostic test results will improve Outcome: Progressing   

## 2021-02-25 NOTE — Progress Notes (Signed)
Patient c/o of CP to the left of sternum. EKG done. Copy in chart. Showed NSR.  Pt stated that it didn't hurt anymore. Will continue to monitor

## 2021-02-25 NOTE — Progress Notes (Addendum)
  Progress Note    02/25/2021 9:23 AM 7 Days Post-Op  Subjective:  Ready for d/c home   Vitals:   02/25/21 0344 02/25/21 0848  BP: (!) 146/72 136/75  Pulse: 73 67  Resp: 15 16  Temp: 98.3 F (36.8 C) 98 F (36.7 C)  SpO2: 100% 100%   Physical Exam: Lungs:  non labored Incisions:  abd and groin incisions healing well Extremities:  palpable ATA pulses Neurologic: A&O  CBC    Component Value Date/Time   WBC 6.8 02/22/2021 0255   RBC 2.88 (L) 02/22/2021 0255   HGB 8.4 (L) 02/22/2021 0255   HGB 13.2 08/16/2018 1406   HCT 25.3 (L) 02/22/2021 0255   HCT 37.1 (L) 08/16/2018 1406   PLT 299 02/22/2021 0255   PLT 332 08/16/2018 1406   MCV 87.8 02/22/2021 0255   MCV 87 08/16/2018 1406   MCH 29.2 02/22/2021 0255   MCHC 33.2 02/22/2021 0255   RDW 12.8 02/22/2021 0255   RDW 13.2 08/16/2018 1406   LYMPHSABS 2.4 12/31/2020 1949   MONOABS 0.5 12/31/2020 1949   EOSABS 0.2 12/31/2020 1949   BASOSABS 0.1 12/31/2020 1949    BMET    Component Value Date/Time   NA 137 02/22/2021 0255   NA 135 03/18/2020 0920   K 4.1 02/22/2021 0255   CL 110 02/22/2021 0255   CO2 21 (L) 02/22/2021 0255   GLUCOSE 184 (H) 02/22/2021 0255   BUN 10 02/22/2021 0255   BUN 28 (H) 03/18/2020 0920   CREATININE 0.89 02/22/2021 0255   CALCIUM 7.9 (L) 02/22/2021 0255   GFRNONAA >60 02/22/2021 0255   GFRAA 55 (L) 03/18/2020 0920    INR    Component Value Date/Time   INR 1.1 02/18/2021 1312     Intake/Output Summary (Last 24 hours) at 02/25/2021 8412 Last data filed at 02/25/2021 0044 Gross per 24 hour  Intake 960 ml  Output --  Net 960 ml     Assessment/Plan:  60 y.o. male is s/p ABF 7 Days Post-Op   BLE well perfused Incisions healing well Tolerating regular diet with Bms Ok for discharge home; office will arrange follow up in 2 weeks   Emilie Rutter, PA-C [facility] [phone] 02/25/2021 9:23 AM  VASCULAR STAFF ADDENDUM: I have independently  interviewed and examined the patient. I agree with the above.  Pt doing well. Palpable pulses, ambulating. Home today  Fara Olden, MD [facility] Phone Number: [phone] 02/25/2021 10:35 AM

## 2021-02-26 ENCOUNTER — Other Ambulatory Visit: Payer: Self-pay | Admitting: Physician Assistant

## 2021-02-26 DIAGNOSIS — I700 Atherosclerosis of aorta: Secondary | ICD-10-CM

## 2021-02-26 LAB — TYPE AND SCREEN
ABO/RH(D): A POS
Antibody Screen: NEGATIVE
Unit division: 0
Unit division: 0
Unit division: 0
Unit division: 0
Unit division: 0
Unit division: 0

## 2021-02-26 LAB — BPAM RBC
Blood Product Expiration Date: 202211232359
Blood Product Expiration Date: 202211232359
Blood Product Expiration Date: 202212082359
Blood Product Expiration Date: 202212082359
Blood Product Expiration Date: 202212182359
Blood Product Expiration Date: 202212182359
ISSUE DATE / TIME: 202211161337
ISSUE DATE / TIME: 202211161337
ISSUE DATE / TIME: 202211200745
ISSUE DATE / TIME: 202211201059
Unit Type and Rh: 6200
Unit Type and Rh: 6200
Unit Type and Rh: 6200
Unit Type and Rh: 6200
Unit Type and Rh: 6200
Unit Type and Rh: 6200

## 2021-02-26 MED ORDER — OXYCODONE-ACETAMINOPHEN 5-325 MG PO TABS: 1.0000 | ORAL_TABLET | Freq: Four times a day (QID) | ORAL | 0 refills | Status: AC | PRN

## 2021-02-26 NOTE — Discharge Summary (Signed)
Aorta Discharge Summary    Dylan Anderson 05-16-1960 60 y.o. male  [id number]  Admission Date: 02/18/2021  Discharge Date: 02/25/21  Physician: Dr. 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 Chestine Spore.  He tolerated the procedure well and was admitted to the ICU postoperatively.  He was transferred to the stepdown unit after 48 hours.  Throughout his hospital stay he maintained palpable pulses in bilateral lower extremities.  Hospital stay was largely uneventful.  Therapy teams recommended home health PT which was arranged by TOC.  He remained an additional night or 2 due to arranging company to stay with him at home after discharge.  He will follow-up in office in 2 to 3 weeks.  He was prescribed narcotic pain medication for continued postoperative pain control.  He was discharged home in stable condition.  It should also be noted that at time of discharge abdomen and groin incisions appear to be healing well  CBC    Component Value Date/Time   WBC 6.8 02/22/2021 0255   RBC 2.88 (L) 02/22/2021 0255   HGB 8.4 (L) 02/22/2021 0255   HGB 13.2 08/16/2018 1406   HCT 25.3 (L) 02/22/2021 0255   HCT 37.1 (L) 08/16/2018 1406   PLT 299 02/22/2021 0255   PLT 332 08/16/2018 1406   MCV 87.8 02/22/2021 0255   MCV 87 08/16/2018 1406   MCH 29.2 02/22/2021 0255   MCHC 33.2 02/22/2021 0255   RDW 12.8 02/22/2021 0255   RDW 13.2 08/16/2018 1406   LYMPHSABS 2.4 12/31/2020 1949   MONOABS 0.5 12/31/2020 1949   EOSABS 0.2 12/31/2020 1949   BASOSABS 0.1 12/31/2020 1949    BMET    Component Value Date/Time   NA 137 02/22/2021 0255   NA 135 03/18/2020 0920   K 4.1 02/22/2021 0255   CL  110 02/22/2021 0255   CO2 21 (L) 02/22/2021 0255   GLUCOSE 184 (H) 02/22/2021 0255   BUN 10 02/22/2021 0255   BUN 28 (H) 03/18/2020 0920   CREATININE 0.89 02/22/2021 0255   CALCIUM 7.9 (L) 02/22/2021 0255   GFRNONAA >60 02/22/2021 0255   GFRAA 55 (L) 03/18/2020 0920     Discharge Instructions     Call MD for:  persistant nausea and vomiting   Complete by: As directed    Call MD for:  redness, tenderness, or signs of infection (pain, swelling, redness, odor or green/yellow discharge around incision site)   Complete by: As directed    Call MD for:  severe uncontrolled pain   Complete by: As directed    Call MD for:  temperature >100.4   Complete by: As directed    Diet - low sodium heart healthy   Complete by: As directed    Discharge patient   Complete by: As directed    Discharge disposition: 01-Home or Self Care   Discharge patient date: 02/24/2021   Discharge wound care:   Complete by: As directed    May shower. Wash incisions with mild soap and water, pat dry. Do not soak in bathtub   Driving Restrictions   Complete by: As directed    No driving while taking pain medication   Increase activity slowly  Complete by: As directed    Lifting restrictions   Complete by: As directed    No heavy lifting for 3 months       Discharge Diagnosis:  S/P aortobifemoral bypass surgery [Z95.828] 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 (HCC) [I70.223]  Secondary Diagnosis: Patient Active Problem List   Diagnosis Date Noted   S/P aortobifemoral bypass surgery 02/18/2021   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 (HCC) 02/18/2021   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   Critical limb ischemia of both lower extremities (HCC) 02/09/2021   Type 2 diabetes mellitus (HCC)    PVD (peripheral vascular disease) (HCC)    Arthritis    Back pain    Dizzy 09/21/2018   Mild CAD 07/08/2018   Abnormal cardiac CT angiography    PVC's (premature  ventricular contractions) 05/28/2018   Chest pain in adult 05/26/2018   Claudication (HCC) 11/05/2017   Acute gout involving toe of left foot 09/21/2017   Chronic cough 07/13/2017   Healthcare maintenance 07/12/2017   Peripheral artery disease (HCC) 06/07/2017   Hip pain 05/20/2017   Gastroesophageal reflux disease 04/27/2017   Insomnia 02/06/2017   Hyperlipidemia 01/25/2017   Erectile dysfunction 01/25/2017   Low back pain 01/25/2017   Diabetes mellitus type II, non insulin dependent (HCC) 07/10/2012   Hypertension 07/10/2012   Tobacco abuse 07/10/2012   Past Medical History:  Diagnosis Date   Acute gout involving toe of left foot 09/21/2017   Anginal pain (HCC)    Anxiety    Arthritis    Back pain    Chest pain in adult 05/26/2018   Chronic cough 07/13/2017   Claudication (HCC)    Depression    Diabetes mellitus type II, non insulin dependent (HCC) 07/10/2012   Dyspnea    Erectile dysfunction    Gastroesophageal reflux disease 04/27/2017   Hand pain 03/18/2017   Healthcare maintenance 07/12/2017   Hip pain 05/20/2017   Hyperlipidemia 01/25/2017   Hypertension 07/10/2012   Insomnia 02/06/2017   Low back pain 01/25/2017   Peripheral artery disease (HCC) 06/07/2017   PVD (peripheral vascular disease) (HCC)    Tobacco abuse 07/10/2012   Type 2 diabetes mellitus (HCC)      Allergies as of 02/25/2021   No Known Allergies      Medication List     TAKE these medications    amLODipine 5 MG tablet Commonly known as: NORVASC TAKE 1 TABLET (5 MG TOTAL) BY MOUTH DAILY.   ASPIRIN LOW DOSE 81 MG EC tablet Generic drug: aspirin TAKE 1 TABLET (81 MG TOTAL) BY MOUTH DAILY.   atorvastatin 80 MG tablet Commonly known as: LIPITOR TAKE 1 TABLET (80 MG TOTAL) BY MOUTH DAILY AT 6 PM.   Basaglar KwikPen 100 UNIT/ML Inject 70 Units into the skin at bedtime.   clopidogrel 75 MG tablet Commonly known as: Plavix Take 1 tablet (75 mg total) by mouth daily.   fenofibrate  48 MG tablet Commonly known as: Tricor Take 1 tablet (48 mg total) by mouth daily.   metFORMIN 1000 MG tablet Commonly known as: GLUCOPHAGE TAKE 1 TABLET (1,000 MG TOTAL) BY MOUTH 2 (TWO) TIMES DAILY WITH A MEAL.   metoprolol succinate 50 MG 24 hr tablet Commonly known as: TOPROL-XL TAKE 1 TABLET (50 MG TOTAL) BY MOUTH AT BEDTIME.   multivitamin with minerals tablet Take 1 tablet by mouth daily.   nitroGLYCERIN 0.4 MG SL tablet Commonly known as: NITROSTAT PLACE 1 TABLET (0.4 MG  TOTAL) UNDER THE TONGUE EVERY 5 (FIVE) MINUTES AS NEEDED FOR CHEST PAIN.   NovoLOG FlexPen 100 UNIT/ML FlexPen Generic drug: insulin aspart INJECT 10 UNITS INTO THE SKIN 2 (TWO) TIMES DAILY WITH A MEAL.   oxyCODONE-acetaminophen 5-325 MG tablet Commonly known as: Percocet Take 1 tablet by mouth every 4 (four) hours as needed for severe pain.   pantoprazole 40 MG tablet Commonly known as: PROTONIX TAKE 1 TABLET (40 MG TOTAL) BY MOUTH DAILY.   pregabalin 100 MG capsule Commonly known as: LYRICA Take 100 mg by mouth 3 (three) times daily.   tamsulosin 0.4 MG Caps capsule Commonly known as: FLOMAX TAKE 1 CAPSULE (0.4 MG TOTAL) BY MOUTH DAILY.   True Metrix Blood Glucose Test test strip Generic drug: glucose blood Use as instructed. Check blood glucose level by fingerstick three times per day. E11.65   TRUEplus 5-Bevel Pen Needles 31G X 8 MM Misc Generic drug: Insulin Pen Needle USE AS DIRECTED WITH INSULIN   TRUEplus Lancets 28G Misc Use as instructed. Check blood glucose level by fingerstick 3 times per day. E11.65               Discharge Care Instructions  (From admission, onward)           Start     Ordered   02/25/21 0000  Discharge wound care:       Comments: May shower. Wash incisions with mild soap and water, pat dry. Do not soak in bathtub   02/25/21 4496            Instructions:  [facility] Discharge Instructions Open Aortic  Surgery  Please refer to the following instructions for your post-procedure care. Your surgeon or Physician Assistant will discuss any changes with you.  Activity  Avoid lifting more than eight pounds (a gallon of milk) until after your first post-operative visit. You are encouraged to walk as much as you can. You can slowly return to normal activities but must avoid strenuous activity and heavy lifting until your doctor tells you it's OK. Heavy lifting can hurt the incision and cause a hernia. Avoid activities such as vacuuming or swinging a golf club. It is normal to feel tired for several weeks after your surgery. Do not drive until your doctor gives the OK and you are no longer taking prescription pain medications. It is also normal to have difficulty with sleep habits, eating and bowl movements after surgery. These will go away with time.  Bathing/Showering  You may shower after you go home. Do not soak in a bathtub, hot tub, or swim until the incision heals.  Incision Care  Shower every day. Clean your incision with mild soap and water. Pat the area dry with a clean towel. You do not need a bandage unless otherwise instructed. Do not apply any ointments or creams to your incision. You may have skin glue on your incision. Do not peel it off. It will come off on its own in about one week. If you have staples or sutures along your incision, they will be removed at your post op appointment.  If you have groin incisions, wash the groin wounds with soap and water daily and pat dry. (No tub bath-only shower)  Then put a dry gauze or washcloth in the groin to keep this area dry to help prevent wound infection.  Do this daily and as needed.  Do not use Vaseline or neosporin on your incisions.  Only use  soap and water on your incisions and then protect and keep dry.  Diet  Resume your normal diet. There are no special food restriction following this procedure. A low fat/low cholesterol diet is  recommended for all patients with vascular disease. After your aortic surgery, it's normal to feel full faster than usual and to not feel as hungry as you normally would. You will probably lose weight initially following your surgery. It's best to eat small, frequent meals over the course of the day. Call the office if you find that you are unable to eat even small meals. In order to heal from your surgery, it is CRITICAL to get adequate nutrition. Your body requires vitamins, minerals, and protein. Vegetables are the best source of vitamins and minerals. causing pain, you may take over-the-counter pain reliever such as acetaminophen (Tylenol). If you were prescribed a stronger pain medication, please be aware these medication can cause nausea and constipation. Prevent nausea by taking the medication with a snack or meal. Avoid constipation by drinking plenty of fluids and eating foods with a high amount of fiber, such as fruits, vegetables and grains. Take  of the over-the-counter stool softener Colace twice a day as needed to help with constipation. A laxative, such as Milk of Magnesia, may be recommended for you at this time. Do not take a laxative unless your surgeon or Physician Assistant. tells you it's OK. Do not take Tylenol if you are taking stronger pain medications (such as Percocet).  Follow Up  Our office will schedule a follow up appointment 2-3 weeks after discharge.  Please call us immediately for any of the following conditions    .     Severe or worsening pain in your legs or feet or in your abdomen back or chest. Increased pain, redness drainage (pus) from your incision site. Increased abdominal pain, bloating, nausea, vomiting, or persistent diarrhea. Fever of 101 degrees or higher. Swelling in your leg (s).  Reduce your risk of vascular disease  Stop smoking. If you would like help, call [facility] at 1-800-QUIT-NOW ((220)385-7018) or [place] at 9072131627. Manage  your cholesterol Maintain a desired weight Control your diabetes Keep your blood pressure down  If you have any questions please call the office at [phone].   Disposition: home  Patient's condition: is Good  Follow up: 1. [facility] in 2 weeks   Emilie Rutter, PA-C [facility] [phone] 02/26/2021  11:02 AM   - For VQI Registry use -  Post-op:  Time to Extubation:  In OR,  < 12 hrs,  12-24 hrs,  >=24 hrs Vasopressors Req. Post-op: No ICU Stay: 2 days Transfusion: No  MI: No,  Troponin only,  EKG or Clinical New Arrhythmia: No  Complications: CHF: No Resp failure: No,  Pneumonia,  Ventilator Chg in renal function: No,  Inc. Cr > 0.5,  Temp. Dialysis,  Permanent dialysis Leg ischemia: No, no Surgery needed,  Yes, Surgery needed,  Amputation Bowel ischemia: No,  Medical Rx,  Surgical Rx Wound complication: No,  Superficial separation/infection,  Return to OR Return to OR: No  Return to OR for bleeding: No Stroke: No,  Minor,  Major  Discharge medications: Statin use:  Yes  ASA use:  Yes   Plavix use:  Yes  Beta blocker use:  Yes  ACEI use:  No ARB use:  No CCB use:  Yes Coumadin  use:  No

## 2021-03-01 ENCOUNTER — Telehealth: Payer: Self-pay

## 2021-03-01 NOTE — Telephone Encounter (Signed)
Gave verbal orders to [facility] from [facility] to do PT 1 w 1, 2 w 1, 1 w 3 - starting on 02/27/2021.

## 2021-03-02 ENCOUNTER — Telehealth: Payer: Self-pay

## 2021-03-02 ENCOUNTER — Other Ambulatory Visit: Payer: Self-pay | Admitting: Physician Assistant

## 2021-03-02 MED ORDER — METHOCARBAMOL 500 MG PO TABS: 500.0000 mg | ORAL_TABLET | Freq: Three times a day (TID) | ORAL | 0 refills | Status: AC | PRN

## 2021-03-02 NOTE — Telephone Encounter (Signed)
[facility] nurse called to let us know pt is c/o R leg pain. It feels "jumpy" and restless. He is taking Oxycodone which helps a little. She asked if he could try muscle relaxer. APP is in agreement to try it. Pt has been made aware that he is to call us back if his pain is not improving or worsening. Pt verbalized understanding of this.

## 2021-03-09 ENCOUNTER — Encounter: Payer: Self-pay | Admitting: Vascular Surgery

## 2021-03-09 ENCOUNTER — Ambulatory Visit (INDEPENDENT_AMBULATORY_CARE_PROVIDER_SITE_OTHER): Payer: Medicaid Other | Admitting: Vascular Surgery

## 2021-03-09 ENCOUNTER — Other Ambulatory Visit: Payer: Self-pay

## 2021-03-09 VITALS — BP 139/78 | HR 94 | Temp 97.8°F | Resp 16 | Ht 69.0 in | Wt 135.0 lb

## 2021-03-09 DIAGNOSIS — Z95828 Presence of other vascular implants and grafts: Secondary | ICD-10-CM

## 2021-03-09 DIAGNOSIS — I739 Peripheral vascular disease, unspecified: Secondary | ICD-10-CM

## 2021-03-09 NOTE — Progress Notes (Signed)
Patient name: Dylan Anderson MRN: [id number] DOB: Nov 07, 1960 Sex: male  REASON FOR VISIT: Postop check after aortobifemoral bypass  HPI: Dylan Anderson is a 60 y.o. male that presents for postop check after aortobifemoral bypass.  Patient underwent aortobifemoral bypass on 02/18/2021 for critical limb ischemia of the bilateral lower extremities including right toe tissue loss with occluded iliac stents.  He did well during his hospitalization and was discharged home without complication.  His only complaint today is some swelling in the left groin.  Otherwise eating and drinking without issue.  He is slowly getting strength back.  Past Medical History:  Diagnosis Date   Acute gout involving toe of left foot 09/21/2017   Anginal pain (HCC)    Anxiety    Arthritis    Back pain    Chest pain in adult 05/26/2018   Chronic cough 07/13/2017   Claudication (HCC)    Depression    Diabetes mellitus type II, non insulin dependent (HCC) 07/10/2012   Dyspnea    Erectile dysfunction    Gastroesophageal reflux disease 04/27/2017   Hand pain 03/18/2017   Healthcare maintenance 07/12/2017   Hip pain 05/20/2017   Hyperlipidemia 01/25/2017   Hypertension 07/10/2012   Insomnia 02/06/2017   Low back pain 01/25/2017   Peripheral artery disease (HCC) 06/07/2017   PVD (peripheral vascular disease) (HCC)    Tobacco abuse 07/10/2012   Type 2 diabetes mellitus (HCC)     Past Surgical History:  Procedure Laterality Date   ABDOMINAL AORTOGRAM W/LOWER EXTREMITY N/A 06/18/2020   Procedure: ABDOMINAL AORTOGRAM W/LOWER EXTREMITY;  Surgeon: Cephus Shelling, MD;  Location: [facility] INVASIVE CV LAB;  Service: Cardiovascular;  Laterality: N/A;   AORTA - BILATERAL FEMORAL ARTERY BYPASS GRAFT Bilateral 02/18/2021   Procedure: AORTOBIFEMORAL BYPASS GRAFT USING 16 x 60mm HEMASHIELD GOLD;  Surgeon: Cephus Shelling, MD;  Location: [facility] OR;  Service: Vascular;  Laterality: Bilateral;   CARDIAC CATHETERIZATION     ILIAC  ARTERY STENT     LEFT HEART CATH AND CORONARY ANGIOGRAPHY N/A 06/19/2018   Procedure: LEFT HEART CATH AND CORONARY ANGIOGRAPHY;  Surgeon: Lennette Bihari, MD;  Location: [facility];  Service: Cardiovascular;  Laterality: N/A;   PERIPHERAL VASCULAR INTERVENTION Bilateral 06/18/2020   Procedure: PERIPHERAL VASCULAR INTERVENTION;  Surgeon: Cephus Shelling, MD;  Location: [facility];  Service: Cardiovascular;  Laterality: Bilateral;  common Iliac   stent Left    left common and left internal illiac stent    Family History  Problem Relation Age of Onset   Heart failure Mother    Osteoarthritis Mother    COPD Sister    Stroke Sister    Heart attack Maternal Aunt    Heart attack Maternal Uncle     SOCIAL HISTORY: Social History   Tobacco Use   Smoking status: Former    Packs/day: 0.50    Years: 17.00    Pack years: 8.50    Types: Cigarettes    Start date: 04/04/1978    Quit date: 01/2021    Years since quitting: 0.1   Smokeless tobacco: Never   Tobacco comments:    Max 1 ppd  Substance Use Topics   Alcohol use: Yes    Comment: occ    No Known Allergies  Current Outpatient Medications  Medication Sig Dispense Refill   amLODipine (NORVASC) 5 MG tablet TAKE 1 TABLET (5 MG TOTAL) BY MOUTH DAILY. 30 tablet 3   aspirin 81 MG EC tablet TAKE 1  TABLET (81 MG TOTAL) BY MOUTH DAILY. 120 tablet 0   atorvastatin (LIPITOR) 80 MG tablet TAKE 1 TABLET (80 MG TOTAL) BY MOUTH DAILY AT 6 PM. 90 tablet 0   clopidogrel (PLAVIX) 75 MG tablet Take 1 tablet (75 mg total) by mouth daily. 30 tablet 11   fenofibrate (TRICOR) 48 MG tablet Take 1 tablet (48 mg total) by mouth daily. 90 tablet 0   glucose blood (TRUE METRIX BLOOD GLUCOSE TEST) test strip Use as instructed. Check blood glucose level by fingerstick three times per day. E11.65 200 each 6   insulin aspart (NOVOLOG) 100 UNIT/ML FlexPen INJECT 10 UNITS INTO THE SKIN 2 (TWO) TIMES DAILY WITH A MEAL. 6 mL 6   Insulin Glargine  (BASAGLAR KWIKPEN) 100 UNIT/ML Inject 70 Units into the skin at bedtime.     Insulin Pen Needle 31G X 8 MM MISC USE AS DIRECTED WITH INSULIN 100 each 2   metFORMIN (GLUCOPHAGE) 1000 MG tablet TAKE 1 TABLET (1,000 MG TOTAL) BY MOUTH 2 (TWO) TIMES DAILY WITH A MEAL. 180 tablet 0   metoprolol succinate (TOPROL-XL) 50 MG 24 hr tablet TAKE 1 TABLET (50 MG TOTAL) BY MOUTH AT BEDTIME. 30 tablet 2   Multiple Vitamins-Minerals (MULTIVITAMIN WITH MINERALS) tablet Take 1 tablet by mouth daily.     nitroGLYCERIN (NITROSTAT) 0.4 MG SL tablet PLACE 1 TABLET (0.4 MG TOTAL) UNDER THE TONGUE EVERY 5 (FIVE) MINUTES AS NEEDED FOR CHEST PAIN. 90 tablet 3   oxyCODONE-acetaminophen (PERCOCET) 5-325 MG tablet Take 1 tablet by mouth every 6 (six) hours as needed for severe pain. 20 tablet 0   pantoprazole (PROTONIX) 40 MG tablet TAKE 1 TABLET (40 MG TOTAL) BY MOUTH DAILY. 90 tablet 0   pregabalin (LYRICA) 100 MG capsule Take 100 mg by mouth 3 (three) times daily.     tamsulosin (FLOMAX) 0.4 MG CAPS capsule TAKE 1 CAPSULE (0.4 MG TOTAL) BY MOUTH DAILY. 30 capsule 0   TRUEplus Lancets 28G MISC Use as instructed. Check blood glucose level by fingerstick 3 times per day. E11.65 200 each 6   methocarbamol (ROBAXIN) 500 MG tablet Take 1 tablet (500 mg total) by mouth every 8 (eight) hours as needed for muscle spasms. (Patient not taking: Reported on 03/09/2021) 20 tablet 0   No current facility-administered medications for this visit.    REVIEW OF SYSTEMS:  [X]  denotes positive finding, [ ]  denotes negative finding Cardiac  Comments:  Chest pain or chest pressure:    Shortness of breath upon exertion:    Short of breath when lying flat:    Irregular heart rhythm:        Vascular    Pain in calf, thigh, or hip brought on by ambulation:    Pain in feet at night that wakes you up from your sleep:     Blood clot in your veins:    Leg swelling:         Pulmonary    Oxygen at home:    Productive cough:     Wheezing:          Neurologic    Sudden weakness in arms or legs:     Sudden numbness in arms or legs:     Sudden onset of difficulty speaking or slurred speech:    Temporary loss of vision in one eye:     Problems with dizziness:         Gastrointestinal    Blood in stool:     Vomited blood:  Genitourinary    Burning when urinating:     Blood in urine:        Psychiatric    Major depression:         Hematologic    Bleeding problems:    Problems with blood clotting too easily:        Skin    Rashes or ulcers:        Constitutional    Fever or chills:      PHYSICAL EXAM: Vitals:   03/09/21 1351  BP: 139/78  Pulse: 94  Resp: 16  Temp: 97.8 F (36.6 C)  TempSrc: Temporal  SpO2: 98%  Weight: 135 lb (61.2 kg)  Height: 5\' 9"  (1.753 m)    GENERAL: The patient is a well-nourished male, in no acute distress. The vital signs are documented above. CARDIAC: There is a regular rate and rhythm.  VASCULAR:  Midline abdominal incision well-healed Both groin incisions well-healed Seroma in left groin with no drainage or cellulitis and soft Bilateral AT pulses palpable Right great toe dry gangrene pictured below     DATA:   None  Assessment/Plan:  60 year old male status post aortobifemoral bypass on 02/18/2021 for critical limb ischemia with tissue loss.  Overall he looks good today.  All of his incisions are healing well although he does have a seroma in the left groin that is soft and not causing any discomfort.  Discussed that we will treat this left groin seroma conservatively with continued observation.  He has palpable AT pulses bilaterally.  The right toe wound is healing as pictured above.  I have asked his wife to apply Betadine paint to the toe daily.  I will see him again in 1 month for wound check of the right foot and to ensure that his left groin seroma improves with time.  Discussed he let 02/20/2021 know if he has any concerns   [place], MD [facility] [place] Office: [phone]

## 2021-03-23 ENCOUNTER — Telehealth: Payer: Self-pay

## 2021-03-23 NOTE — Telephone Encounter (Signed)
PT called today to report that patient fell a few times last week - no complaints or injury. They would like to extend PT 1 w 4. Gave verbal okay.

## 2021-04-06 ENCOUNTER — Other Ambulatory Visit: Payer: Self-pay

## 2021-04-06 ENCOUNTER — Ambulatory Visit (INDEPENDENT_AMBULATORY_CARE_PROVIDER_SITE_OTHER): Payer: Medicaid Other | Admitting: Vascular Surgery

## 2021-04-06 VITALS — BP 94/67 | HR 105 | Temp 98.3°F | Resp 16 | Ht 70.0 in | Wt 135.0 lb

## 2021-04-06 DIAGNOSIS — Z95828 Presence of other vascular implants and grafts: Secondary | ICD-10-CM

## 2021-04-06 DIAGNOSIS — I70223 Atherosclerosis of native arteries of extremities with rest pain, bilateral legs: Secondary | ICD-10-CM

## 2021-04-06 NOTE — Progress Notes (Signed)
Patient name: Dylan Anderson MRN: [id number] DOB: Nov 02, 1960 Sex: male  REASON FOR VISIT: follow-up after aortobifemoral bypass, left groin check for seroma  HPI: Dylan IdaBill Bayne is a 61 y.o. male that presents for continued follow-up after aortobifemoral bypass.  Patient underwent aortobifemoral bypass on 02/18/2021 for critical limb ischemia of the bilateral lower extremities including right toe tissue loss with occluded iliac stents.  He did well during his hospitalization and was discharged home without complication.    At his 1 month postop visit he had a seroma in the left groin.  We have been following this with conservative management.  On follow-up today he reports the left groin seroma is completely resolved.  His right great toe wound is healing.  He does have neuropathy in both feet.  Past Medical History:  Diagnosis Date   Acute gout involving toe of left foot 09/21/2017   Anginal pain (HCC)    Anxiety    Arthritis    Back pain    Chest pain in adult 05/26/2018   Chronic cough 07/13/2017   Claudication (HCC)    Depression    Diabetes mellitus type II, non insulin dependent (HCC) 07/10/2012   Dyspnea    Erectile dysfunction    Gastroesophageal reflux disease 04/27/2017   Hand pain 03/18/2017   Healthcare maintenance 07/12/2017   Hip pain 05/20/2017   Hyperlipidemia 01/25/2017   Hypertension 07/10/2012   Insomnia 02/06/2017   Low back pain 01/25/2017   Peripheral artery disease (HCC) 06/07/2017   PVD (peripheral vascular disease) (HCC)    Tobacco abuse 07/10/2012   Type 2 diabetes mellitus (HCC)     Past Surgical History:  Procedure Laterality Date   ABDOMINAL AORTOGRAM W/LOWER EXTREMITY N/A 06/18/2020   Procedure: ABDOMINAL AORTOGRAM W/LOWER EXTREMITY;  Surgeon: Cephus Shellinglark, Kathleena Freeman J, MD;  [facility];  Service: Cardiovascular;  Laterality: N/A;   AORTA - BILATERAL FEMORAL ARTERY BYPASS GRAFT Bilateral 02/18/2021   Procedure: AORTOBIFEMORAL BYPASS  GRAFT USING 16 x 8mm HEMASHIELD GOLD;  Surgeon: Cephus Shellinglark, Teja Judice J, MD;  Location: [facility];  Service: Vascular;  Laterality: Bilateral;   CARDIAC CATHETERIZATION     ILIAC ARTERY STENT     LEFT HEART CATH AND CORONARY ANGIOGRAPHY N/A 06/19/2018   Procedure: LEFT HEART CATH AND CORONARY ANGIOGRAPHY;  Surgeon: Lennette BihariKelly, Thomas A, MD;  [facility];  Service: Cardiovascular;  Laterality: N/A;   PERIPHERAL VASCULAR INTERVENTION Bilateral 06/18/2020   Procedure: PERIPHERAL VASCULAR INTERVENTION;  Surgeon: Cephus Shellinglark, Delmas Faucett J, MD;  [facility];  Service: Cardiovascular;  Laterality: Bilateral;  common Iliac   stent Left    left common and left internal illiac stent    Family History  Problem Relation Age of Onset   Heart failure Mother    Osteoarthritis Mother    COPD Sister    Stroke Sister    Heart attack Maternal Aunt    Heart attack Maternal Uncle     SOCIAL HISTORY: Social History   Tobacco Use   Smoking status: Former    Packs/day: 0.50    Years: 17.00    Pack years: 8.50    Types: Cigarettes    Start date: 04/04/1978    Quit date: 01/2021    Years since quitting: 0.2   Smokeless tobacco: Never   Tobacco comments:    Max 1 ppd  Substance Use Topics   Alcohol use: Yes    Comment: occ    No Known Allergies  Current Outpatient Medications  Medication Sig Dispense Refill   amLODipine (NORVASC) 5 MG tablet TAKE 1 TABLET (5 MG TOTAL) BY MOUTH DAILY. 30 tablet 3   aspirin 81 MG EC tablet TAKE 1 TABLET (81 MG TOTAL) BY MOUTH DAILY. 120 tablet 0   atorvastatin (LIPITOR) 80 MG tablet TAKE 1 TABLET (80 MG TOTAL) BY MOUTH DAILY AT 6 PM. 90 tablet 0   clopidogrel (PLAVIX) 75 MG tablet Take 1 tablet (75 mg total) by mouth daily. 30 tablet 11   fenofibrate (TRICOR) 48 MG tablet Take 1 tablet (48 mg total) by mouth daily. 90 tablet 0   glucose blood (TRUE METRIX BLOOD GLUCOSE TEST) test strip Use as instructed. Check blood glucose level by fingerstick  three times per day. E11.65 200 each 6   insulin aspart (NOVOLOG) 100 UNIT/ML FlexPen INJECT 10 UNITS INTO THE SKIN 2 (TWO) TIMES DAILY WITH A MEAL. 6 mL 6   Insulin Glargine (BASAGLAR KWIKPEN) 100 UNIT/ML Inject 70 Units into the skin at bedtime.     Insulin Pen Needle 31G X 8 MM MISC USE AS DIRECTED WITH INSULIN 100 each 2   metFORMIN (GLUCOPHAGE) 1000 MG tablet TAKE 1 TABLET (1,000 MG TOTAL) BY MOUTH 2 (TWO) TIMES DAILY WITH A MEAL. 180 tablet 0   metoprolol succinate (TOPROL-XL) 50 MG 24 hr tablet TAKE 1 TABLET (50 MG TOTAL) BY MOUTH AT BEDTIME. 30 tablet 2   Multiple Vitamins-Minerals (MULTIVITAMIN WITH MINERALS) tablet Take 1 tablet by mouth daily.     pantoprazole (PROTONIX) 40 MG tablet TAKE 1 TABLET (40 MG TOTAL) BY MOUTH DAILY. 90 tablet 0   pregabalin (LYRICA) 100 MG capsule Take 100 mg by mouth 3 (three) times daily.     tamsulosin (FLOMAX) 0.4 MG CAPS capsule TAKE 1 CAPSULE (0.4 MG TOTAL) BY MOUTH DAILY. 30 capsule 0   TRUEplus Lancets 28G MISC Use as instructed. Check blood glucose level by fingerstick 3 times per day. E11.65 200 each 6   methocarbamol (ROBAXIN) 500 MG tablet Take 1 tablet (500 mg total) by mouth every 8 (eight) hours as needed for muscle spasms. (Patient not taking: Reported on 04/06/2021) 20 tablet 0   nitroGLYCERIN (NITROSTAT) 0.4 MG SL tablet PLACE 1 TABLET (0.4 MG TOTAL) UNDER THE TONGUE EVERY 5 (FIVE) MINUTES AS NEEDED FOR CHEST PAIN. 90 tablet 3   oxyCODONE-acetaminophen (PERCOCET) 5-325 MG tablet Take 1 tablet by mouth every 6 (six) hours as needed for severe pain. (Patient not taking: Reported on 04/06/2021) 20 tablet 0   No current facility-administered medications for this visit.    REVIEW OF SYSTEMS:  [X]  denotes positive finding, [ ]  denotes negative finding Cardiac  Comments:  Chest pain or chest pressure:    Shortness of breath upon exertion:    Short of breath when lying flat:    Irregular heart rhythm:        Vascular    Pain in calf, thigh, or  hip brought on by ambulation:    Pain in feet at night that wakes you up from your sleep:     Blood clot in your veins:    Leg swelling:         Pulmonary    Oxygen at home:    Productive cough:     Wheezing:         Neurologic    Sudden weakness in arms or legs:     Sudden numbness in arms or legs:     Sudden onset of difficulty speaking or slurred speech:  Temporary loss of vision in one eye:     Problems with dizziness:         Gastrointestinal    Blood in stool:     Vomited blood:         Genitourinary    Burning when urinating:     Blood in urine:        Psychiatric    Major depression:         Hematologic    Bleeding problems:    Problems with blood clotting too easily:        Skin    Rashes or ulcers:        Constitutional    Fever or chills:      PHYSICAL EXAM: Vitals:   04/06/21 1521  BP: 94/67  Pulse: (!) 105  Resp: 16  Temp: 98.3 F (36.8 C)  TempSrc: Temporal  SpO2: 97%  Weight: 135 lb (61.2 kg)  Height: 5\' 10"  (1.778 m)    GENERAL: The patient is a well-nourished male, in no acute distress. The vital signs are documented above. CARDIAC: There is a regular rate and rhythm.  VASCULAR:  Midline abdominal incision well-healed Both groin incisions well-healed Left groin seroma resolved Bilateral AT pulses palpable Right great toe dry gangrene pictured below       DATA:   None  Assessment/Plan:  61 year old male status post aortobifemoral bypass on 02/18/2021 for critical limb ischemia with tissue loss.  He continues to look good today.  Palpable femoral pulses in both groins.  Both groin incisions are completely healed and the seroma in the left groin is resolved.  He has palpable AT pulses bilaterally at the ankles.  I will have him follow-up in 9 months with ABIs.  I suspect his right great toe tissue loss will continue to improve and this has already shown significant improvement.  Discussed he call with questions or  concerns.   02/20/2021, MD [facility] Office: [phone]

## 2021-04-07 ENCOUNTER — Other Ambulatory Visit: Payer: Self-pay

## 2021-04-07 DIAGNOSIS — I70223 Atherosclerosis of native arteries of extremities with rest pain, bilateral legs: Secondary | ICD-10-CM

## 2021-05-07 ENCOUNTER — Other Ambulatory Visit (HOSPITAL_COMMUNITY): Payer: Self-pay

## 2021-05-07 ENCOUNTER — Other Ambulatory Visit: Payer: Self-pay | Admitting: Family

## 2021-05-07 DIAGNOSIS — E782 Mixed hyperlipidemia: Secondary | ICD-10-CM

## 2021-05-07 DIAGNOSIS — E119 Type 2 diabetes mellitus without complications: Secondary | ICD-10-CM

## 2021-05-07 DIAGNOSIS — K219 Gastro-esophageal reflux disease without esophagitis: Secondary | ICD-10-CM

## 2021-05-07 DIAGNOSIS — Z794 Long term (current) use of insulin: Secondary | ICD-10-CM

## 2021-05-07 MED ORDER — METFORMIN HCL 1000 MG PO TABS
ORAL_TABLET | Freq: Two times a day (BID) | ORAL | 0 refills | Status: AC
  Filled 2021-05-07: qty 90, 45d supply, fill #0

## 2021-05-07 MED ORDER — FENOFIBRATE 48 MG PO TABS
48.0000 mg | ORAL_TABLET | Freq: Every day | ORAL | 0 refills | Status: AC
  Filled 2021-05-07: qty 30, 30d supply, fill #0

## 2021-05-07 MED ORDER — PANTOPRAZOLE SODIUM 40 MG PO TBEC
DELAYED_RELEASE_TABLET | Freq: Every day | ORAL | 0 refills | Status: AC
  Filled 2021-05-07: qty 30, 30d supply, fill #0

## 2021-05-07 MED ORDER — ATORVASTATIN CALCIUM 80 MG PO TABS
ORAL_TABLET | Freq: Every day | ORAL | 0 refills | Status: AC
  Filled 2021-05-07: qty 30, 30d supply, fill #0

## 2021-05-07 MED FILL — Insulin Pen Needle 31 G X 8 MM (1/3" or 5/16"): 30 days supply | Qty: 100 | Fill #0 | Status: CN

## 2021-05-07 MED FILL — Amlodipine Besylate Tab 5 MG (Base Equivalent): ORAL | 30 days supply | Qty: 30 | Fill #0 | Status: CN

## 2021-05-07 MED FILL — Insulin Aspart Soln Pen-injector 100 Unit/ML: SUBCUTANEOUS | 30 days supply | Qty: 6 | Fill #0 | Status: CN

## 2021-05-10 ENCOUNTER — Other Ambulatory Visit: Payer: Self-pay

## 2021-05-10 ENCOUNTER — Other Ambulatory Visit (HOSPITAL_COMMUNITY): Payer: Self-pay

## 2021-05-10 MED FILL — Amlodipine Besylate Tab 5 MG (Base Equivalent): ORAL | 30 days supply | Qty: 30 | Fill #0 | Status: CN

## 2021-05-10 MED FILL — Insulin Pen Needle 31 G X 8 MM (1/3" or 5/16"): 30 days supply | Qty: 100 | Fill #0 | Status: CN

## 2021-05-10 MED FILL — Insulin Aspart Soln Pen-injector 100 Unit/ML: SUBCUTANEOUS | 30 days supply | Qty: 6 | Fill #0 | Status: CN

## 2021-05-17 ENCOUNTER — Other Ambulatory Visit: Payer: Self-pay

## 2021-08-04 ENCOUNTER — Ambulatory Visit: Payer: Medicaid Other | Admitting: Cardiology

## 2021-08-04 NOTE — Progress Notes (Deleted)
Cardiology Office Note:    Date:  08/04/2021   ID:  Dylan Anderson, DOB [birth year], MRN [id number]  PCP:  Nechama Guard, FNP  Cardiologist:  Norman Herrlich, MD    Referring MD: Nechama Guard, FNP    ASSESSMENT:    No diagnosis found. PLAN:    In order of problems listed above:  ***   Next appointment: ***   Medication Adjustments/Labs and Tests Ordered: Current medicines are reviewed at length with the patient today.  Concerns regarding medicines are outlined above.  No orders of the defined types were placed in this encounter.  No orders of the defined types were placed in this encounter.  Chief complaint follow-up for CAD   History of Present Illness:    Dylan Anderson is a 61 y.o. male with a hx of  coronary artery disease hypertension dyslipidemia type 2 diabetes frequent PVCs and peripheral arterial disease with claudication last seen 02/03/2021   The compliance with diet, lifestyle and medications:yes  He had abdominal aortography performed by Dr. Chestine Spore vascular surgery 06/18/2020 the infrarenal aorta was patent but small in caliber left common iliac artery stent was occluded right common iliac artery had moderate disease without flow-limiting stenosis he had repeat PCI with good angiographic results and no residual stenosis after PCI and stent of both common iliac arteries.  Past Medical History:  Diagnosis Date   Acute gout involving toe of left foot 09/21/2017   Anginal pain (HCC)    Anxiety    Arthritis    Back pain    Chest pain in adult 05/26/2018   Chronic cough 07/13/2017   Claudication (HCC)    Depression    Diabetes mellitus type II, non insulin dependent (HCC) 07/10/2012   Dyspnea    Erectile dysfunction    Gastroesophageal reflux disease 04/27/2017   Hand pain 03/18/2017   Healthcare maintenance 07/12/2017   Hip pain 05/20/2017   Hyperlipidemia 01/25/2017   Hypertension 07/10/2012   Insomnia 02/06/2017   Low back pain 01/25/2017    Peripheral artery disease (HCC) 06/07/2017   PVD (peripheral vascular disease) (HCC)    Tobacco abuse 07/10/2012   Type 2 diabetes mellitus (HCC)     Past Surgical History:  Procedure Laterality Date   ABDOMINAL AORTOGRAM W/LOWER EXTREMITY N/A 06/18/2020   Procedure: ABDOMINAL AORTOGRAM W/LOWER EXTREMITY;  Surgeon: Cephus Shelling, MD;  [facility];  Service: Cardiovascular;  Laterality: N/A;   AORTA - BILATERAL FEMORAL ARTERY BYPASS GRAFT Bilateral 02/18/2021   Procedure: AORTOBIFEMORAL BYPASS GRAFT USING 16 x 63mm HEMASHIELD GOLD;  Surgeon: Cephus Shelling, MD;  [facility];  Service: Vascular;  Laterality: Bilateral;   CARDIAC CATHETERIZATION     ILIAC ARTERY STENT     LEFT HEART CATH AND CORONARY ANGIOGRAPHY N/A 06/19/2018   Procedure: LEFT HEART CATH AND CORONARY ANGIOGRAPHY;  Surgeon: Lennette Bihari, MD;  [facility];  Service: Cardiovascular;  Laterality: N/A;   PERIPHERAL VASCULAR INTERVENTION Bilateral 06/18/2020   Procedure: PERIPHERAL VASCULAR INTERVENTION;  Surgeon: Cephus Shelling, MD;  [facility];  Service: Cardiovascular;  Laterality: Bilateral;  common Iliac   stent Left    left common and left internal illiac stent    Current Medications: No outpatient medications have been marked as taking for the 08/04/21 encounter (Appointment) with Baldo Daub, MD.     Allergies:   Patient has no known allergies.   Social History   Socioeconomic History  Marital status: Legally Separated    Spouse name: Not on file   Number of children: Not on file   Years of education: Not on file   Highest education level: Not on file  Occupational History   Occupation: unemployed  Tobacco Use   Smoking status: Former    Packs/day: 0.50    Years: 17.00    Pack years: 8.50    Types: Cigarettes    Start date: 04/04/1978    Quit date: 01/2021    Years since quitting: 0.5   Smokeless tobacco: Never   Tobacco  comments:    Max 1 ppd  Vaping Use   Vaping Use: Never used  Substance and Sexual Activity   Alcohol use: Yes    Comment: occ   Drug use: No   Sexual activity: Yes    Partners: Female  Other Topics Concern   Not on file  Social History Narrative   Not on file   Social Determinants of Health   Financial Resource Strain: Not on file  Food Insecurity: Not on file  Transportation Needs: Not on file  Physical Activity: Not on file  Stress: Not on file  Social Connections: Not on file     Family History: The patient's ***family history includes COPD in his sister; Heart attack in his maternal aunt and maternal uncle; Heart failure in his mother; Osteoarthritis in his mother; Stroke in his sister. ROS:   Please see the history of present illness.    All other systems reviewed and are negative.  EKGs/Labs/Other Studies Reviewed:    The following studies were reviewed today: Left heart catheterization 06/19/2018 LEFT HEART CATH AND CORONARY ANGIOGRAPHY    Conclusion Ost RCA to Prox RCA lesion is 10% stenosed. Ost 1st Mrg to 1st Mrg lesion is 30% stenosed. Ost Cx to Prox Cx lesion is 30% stenosed. Ost Ramus to Ramus lesion is 80% stenosed. Ost LM to Mid LM lesion is 10% stenosed. Ost LAD to Prox LAD lesion is 15% stenosed. Mid Cx lesion is 20% stenosed. Prox LAD lesion is 35% stenosed. The left ventricular systolic function is normal. LV end diastolic pressure is normal. The left ventricular ejection fraction is greater than 65% by visual estimate.   Significant coronary calcification involving the left main, LAD, intermediate, and circumflex vessels.  There is smooth mild 10% narrowing of the left main.  The LAD has mild proximal irregularity of 10 to 15% followed by 30 to 35% proximal stenosis before diagonal vessel;  the ramus intermediate vessel is a very small caliber vessel which has 80% ostial proximal narrowing; the left circumflex vessel is a codominant vessel with  calcification proximally and in the major marginal branch.  There is 25% proximal narrowing and 30% narrowing in the proximal circumflex marginal branch with an additional 20% narrowing in a inferior sub-branch; and RCA with mild haziness proximally with 10% narrowing.   Hyperdynamic LV function with an EF of at least 65% without focal segmental wall motion abnormalities.  LVEDP is 7 to 10 mm hg.  Coronary Diagrams   Diagnostic Dominance: Right         EKG:  EKG ordered today and personally reviewed.  The ekg ordered today demonstrates ***  Recent Labs: 02/19/2021: ALT 13; Magnesium 1.8 02/22/2021: BUN 10; Creatinine, Ser 0.89; Hemoglobin 8.4; Platelets 299; Potassium 4.1; Sodium 137  Recent Lipid Panel    Component Value Date/Time   CHOL 125 03/18/2020 0920   TRIG 217 (H) 03/18/2020 0920  HDL 46 03/18/2020 0920   CHOLHDL 2.7 03/18/2020 0920   CHOLHDL 5.7 07/10/2012 1301   VLDL 36 07/10/2012 1301   LDLCALC 45 03/18/2020 0920    Physical Exam:    VS:  There were no vitals taken for this visit.    Wt Readings from Last 3 Encounters:  04/06/21 135 lb (61.2 kg)  03/09/21 135 lb (61.2 kg)  02/23/21 151 lb 0.2 oz (68.5 kg)     GEN: *** Well nourished, well developed in no acute distress HEENT: Normal NECK: No JVD; No carotid bruits LYMPHATICS: No lymphadenopathy CARDIAC: ***RRR, no murmurs, rubs, gallops RESPIRATORY:  Clear to auscultation without rales, wheezing or rhonchi  ABDOMEN: Soft, non-tender, non-distended MUSCULOSKELETAL:  No edema; No deformity  SKIN: Warm and dry NEUROLOGIC:  Alert and oriented x 3 PSYCHIATRIC:  Normal affect    Signed, Norman HerrlichBrian Bethannie Iglehart, MD  08/04/2021 7:32 AM    [facility]

## 2021-08-18 ENCOUNTER — Other Ambulatory Visit: Payer: Self-pay | Admitting: Vascular Surgery

## 2021-08-18 ENCOUNTER — Other Ambulatory Visit: Payer: Self-pay

## 2021-08-18 MED ORDER — CLOPIDOGREL BISULFATE 75 MG PO TABS
75.0000 mg | ORAL_TABLET | Freq: Every day | ORAL | 11 refills | Status: DC
  Filled 2021-08-18 – 2021-10-18 (×2): qty 30, 30d supply, fill #0
  Filled 2021-10-18 – 2021-11-30 (×2): qty 30, 30d supply, fill #1
  Filled 2022-06-06: qty 30, 30d supply, fill #2

## 2021-08-24 ENCOUNTER — Other Ambulatory Visit: Payer: Self-pay

## 2021-10-18 ENCOUNTER — Other Ambulatory Visit (HOSPITAL_COMMUNITY): Payer: Self-pay

## 2021-10-18 ENCOUNTER — Other Ambulatory Visit: Payer: Self-pay

## 2021-10-19 ENCOUNTER — Other Ambulatory Visit (HOSPITAL_COMMUNITY): Payer: Self-pay

## 2021-10-22 ENCOUNTER — Other Ambulatory Visit: Payer: Self-pay

## 2021-11-30 ENCOUNTER — Other Ambulatory Visit (HOSPITAL_COMMUNITY): Payer: Self-pay

## 2022-04-27 ENCOUNTER — Other Ambulatory Visit: Payer: Self-pay | Admitting: *Deleted

## 2022-04-27 DIAGNOSIS — I70223 Atherosclerosis of native arteries of extremities with rest pain, bilateral legs: Secondary | ICD-10-CM

## 2022-04-27 DIAGNOSIS — I739 Peripheral vascular disease, unspecified: Secondary | ICD-10-CM

## 2022-05-10 ENCOUNTER — Ambulatory Visit (INDEPENDENT_AMBULATORY_CARE_PROVIDER_SITE_OTHER): Payer: Medicaid Other | Admitting: Vascular Surgery

## 2022-05-10 ENCOUNTER — Encounter: Payer: Self-pay | Admitting: Vascular Surgery

## 2022-05-10 ENCOUNTER — Ambulatory Visit (HOSPITAL_COMMUNITY)
Admission: RE | Admit: 2022-05-10 | Discharge: 2022-05-10 | Disposition: A | Discharge status: Home / Self Care | Payer: Medicaid Other | Source: Ambulatory Visit | Attending: Vascular Surgery | Admitting: Vascular Surgery

## 2022-05-10 VITALS — BP 159/84 | HR 60 | Temp 97.8°F | Resp 16 | Ht 69.0 in | Wt 131.0 lb

## 2022-05-10 DIAGNOSIS — I739 Peripheral vascular disease, unspecified: Secondary | ICD-10-CM | POA: Diagnosis present

## 2022-05-10 DIAGNOSIS — Z95828 Presence of other vascular implants and grafts: Secondary | ICD-10-CM

## 2022-05-10 DIAGNOSIS — I70223 Atherosclerosis of native arteries of extremities with rest pain, bilateral legs: Secondary | ICD-10-CM | POA: Diagnosis present

## 2022-05-10 DIAGNOSIS — I10 Essential (primary) hypertension: Secondary | ICD-10-CM | POA: Diagnosis not present

## 2022-05-10 DIAGNOSIS — F17200 Nicotine dependence, unspecified, uncomplicated: Secondary | ICD-10-CM | POA: Insufficient documentation

## 2022-05-10 DIAGNOSIS — E1151 Type 2 diabetes mellitus with diabetic peripheral angiopathy without gangrene: Secondary | ICD-10-CM | POA: Diagnosis not present

## 2022-05-10 DIAGNOSIS — I70233 Atherosclerosis of native arteries of right leg with ulceration of ankle: Secondary | ICD-10-CM | POA: Diagnosis not present

## 2022-05-10 LAB — VAS US ABI WITH/WO TBI
Left ABI: 1.08
Right ABI: 1.06

## 2022-05-10 NOTE — Progress Notes (Signed)
Patient name: Dylan Anderson MRN: [id number] DOB: [birth year] Sex: male  REASON FOR VISIT: 56 month follow-up with ABI after aortobifemoral bypass  HPI: Dylan Anderson is a 62 y.o. male that presents for 9 month follow-up after aortobifemoral bypass.  Patient underwent aortobifemoral bypass on 02/18/2021 for critical limb ischemia of the bilateral lower extremities including right toe tissue loss with occluded iliac stents.  He did well during his hospitalization and was discharged home without complication.  His tissue loss ultimately healed.  Today he states he is having numbness in both feet again.  His hemoglobin A1c is improved from 11 to 7.  Still smoking about 1 pack every 3 days.  Limited mobility and activity level.    Past Medical History:  Diagnosis Date   Acute gout involving toe of left foot 09/21/2017   Anginal pain (HCC)    Anxiety    Arthritis    Back pain    Chest pain in adult 05/26/2018   Chronic cough 07/13/2017   Claudication ([place])    Depression    Diabetes mellitus type II, non insulin dependent ([place]) 07/10/2012   Dyspnea    Erectile dysfunction    Gastroesophageal reflux disease 04/27/2017   Hand pain 03/18/2017   Healthcare maintenance 07/12/2017   Hip pain 05/20/2017   Hyperlipidemia 01/25/2017   Hypertension 07/10/2012   Insomnia 02/06/2017   Low back pain 01/25/2017   Peripheral artery disease ([place]) 06/07/2017   PVD (peripheral vascular disease) ([place])    Tobacco abuse 07/10/2012   Type 2 diabetes mellitus ([place])     Past Surgical History:  Procedure Laterality Date   ABDOMINAL AORTOGRAM W/LOWER EXTREMITY N/A 06/18/2020   Procedure: ABDOMINAL AORTOGRAM W/LOWER EXTREMITY;  Surgeon: Marty Heck, MD;  Location: [facility];  Service: Cardiovascular;  Laterality: N/A;   AORTA - BILATERAL FEMORAL ARTERY BYPASS GRAFT Bilateral 02/18/2021   Procedure: AORTOBIFEMORAL BYPASS GRAFT USING 16 x 61mm HEMASHIELD GOLD;  Surgeon: Marty Heck,  MD;  Location: [facility];  Service: Vascular;  Laterality: Bilateral;   [place] CATH AND CORONARY ANGIOGRAPHY N/A 06/19/2018   Procedure: LEFT HEART CATH AND CORONARY ANGIOGRAPHY;  Surgeon: Troy Sine, MD;  Location: [facility];  Service: Cardiovascular;  Laterality: N/A;   PERIPHERAL VASCULAR INTERVENTION Bilateral 06/18/2020   Procedure: PERIPHERAL VASCULAR INTERVENTION;  Surgeon: Marty Heck, MD;  Location: [facility];  Service: Cardiovascular;  Laterality: Bilateral;  common Iliac   stent Left    left common and left internal illiac stent    Family History  Problem Relation Age of Onset   Heart failure Mother    Osteoarthritis Mother    COPD Sister    Stroke Sister    Heart attack Maternal Aunt    Heart attack Maternal Uncle     SOCIAL HISTORY: Social History   Tobacco Use   Smoking status: Former    Packs/day: 0.50    Years: 17.00    Total pack years: 8.50    Types: Cigarettes    Start date: 04/04/1978    Quit date: 01/2021    Years since quitting: 1.3   Smokeless tobacco: Never   Tobacco comments:    Max 1 ppd  Substance Use Topics   Alcohol use: Yes    Comment: occ    No Known Allergies  Current Outpatient Medications  Medication Sig Dispense Refill   TRUEplus Lancets  28G MISC Use as instructed. Check blood glucose level by fingerstick 3 times per day. E11.65 200 each 6   amLODipine (NORVASC) 5 MG tablet TAKE 1 TABLET (5 MG TOTAL) BY MOUTH DAILY. 30 tablet 3   atorvastatin (LIPITOR) 80 MG tablet TAKE 1 TABLET (80 MG TOTAL) BY MOUTH DAILY AT 6 PM. 30 tablet 0   clopidogrel (PLAVIX) 75 MG tablet Take 1 tablet (75 mg total) by mouth daily. 30 tablet 11   fenofibrate (TRICOR) 48 MG tablet Take 1 tablet (48 mg total) by mouth daily.  Please make appt for refills. 30 tablet 0   glucose blood (TRUE METRIX BLOOD GLUCOSE TEST) test strip Use as instructed. Check blood glucose level by  fingerstick three times per day. E11.65 200 each 6   insulin aspart (NOVOLOG) 100 UNIT/ML FlexPen INJECT 10 UNITS INTO THE SKIN 2 (TWO) TIMES DAILY WITH A MEAL. 6 mL 6   Insulin Glargine (BASAGLAR KWIKPEN) 100 UNIT/ML Inject 70 Units into the skin at bedtime.     metFORMIN (GLUCOPHAGE) 1000 MG tablet TAKE 1 TABLET (1,000 MG TOTAL) BY MOUTH 2 (TWO) TIMES DAILY WITH A MEAL.  Please make appt for refills. 90 tablet 0   methocarbamol (ROBAXIN) 500 MG tablet Take 1 tablet (500 mg total) by mouth every 8 (eight) hours as needed for muscle spasms. (Patient not taking: Reported on 04/06/2021) 20 tablet 0   metoprolol succinate (TOPROL-XL) 50 MG 24 hr tablet TAKE 1 TABLET (50 MG TOTAL) BY MOUTH AT BEDTIME. 30 tablet 2   Multiple Vitamins-Minerals (MULTIVITAMIN WITH MINERALS) tablet Take 1 tablet by mouth daily. (Patient not taking: Reported on 05/10/2022)     nitroGLYCERIN (NITROSTAT) 0.4 MG SL tablet PLACE 1 TABLET (0.4 MG TOTAL) UNDER THE TONGUE EVERY 5 (FIVE) MINUTES AS NEEDED FOR CHEST PAIN. 90 tablet 3   pantoprazole (PROTONIX) 40 MG tablet TAKE 1 TABLET (40 MG TOTAL) BY MOUTH DAILY.  Please make appt for refills. 30 tablet 0   pregabalin (LYRICA) 100 MG capsule Take 100 mg by mouth 3 (three) times daily. (Patient not taking: Reported on 05/10/2022)     No current facility-administered medications for this visit.    REVIEW OF SYSTEMS:  [X]  denotes positive finding, [ ]  denotes negative finding Cardiac  Comments:  Chest pain or chest pressure:    Shortness of breath upon exertion:    Short of breath when lying flat:    Irregular heart rhythm:        Vascular    Pain in calf, thigh, or hip brought on by ambulation:    Pain in feet at night that wakes you up from your sleep:     Blood clot in your veins:    Leg swelling:         Pulmonary    Oxygen at home:    Productive cough:     Wheezing:         Neurologic    Sudden weakness in arms or legs:     Sudden numbness in arms or legs:     Sudden  onset of difficulty speaking or slurred speech:    Temporary loss of vision in one eye:     Problems with dizziness:         Gastrointestinal    Blood in stool:     Vomited blood:         Genitourinary    Burning when urinating:     Blood in urine:  Psychiatric    Major depression:         Hematologic    Bleeding problems:    Problems with blood clotting too easily:        Skin    Rashes or ulcers:        Constitutional    Fever or chills:      PHYSICAL EXAM: Vitals:   05/10/22 1129  BP: (!) 159/84  Pulse: 60  Resp: 16  Temp: 97.8 F (36.6 C)  TempSrc: Temporal  SpO2: 98%  Weight: 131 lb (59.4 kg)  Height: 5\' 9"  (1.753 m)    GENERAL: The patient is a well-nourished male, in no acute distress. The vital signs are documented above. CARDIAC: There is a regular rate and rhythm.  VASCULAR:  Midline abdominal incision well-healed Both groin incisions well-healed Bilateral femoral pulses palpable Right PT palpable Left AT/DP palpable No tissue loss of lower extremities   DATA:   ABI's today 1.02 right triphasic and 1.08 left biphasic  (previously 0.23 right and 0.42 left 01/14/21 prior to aortobifemoral bypass)  Assessment/Plan:  62 year old male status post aortobifemoral bypass on 02/18/2021 for critical limb ischemia with tissue loss.  He has palpable femoral pulses on exam and all of his previous incisions have healed without issue.  He has palpable right PT as well as a left AT pulse.  His ABIs are normal and much improved from before his aortobifemoral bypass.  I discussed that I do not feel arterial insufficiency explains his foot numbness that sounds very much like neuropathy.  He had poorly controlled diabetes in the past.  I will have him follow-up in 1 year with ABIs in the [facility].  He can call with questions or concerns.   Marty Heck, MD [facility] Office: [phone]

## 2022-06-06 ENCOUNTER — Other Ambulatory Visit (HOSPITAL_COMMUNITY): Payer: Self-pay

## 2022-06-07 ENCOUNTER — Encounter (HOSPITAL_COMMUNITY): Payer: Self-pay

## 2022-06-07 ENCOUNTER — Other Ambulatory Visit (HOSPITAL_COMMUNITY): Payer: Self-pay

## 2022-06-10 ENCOUNTER — Other Ambulatory Visit (HOSPITAL_COMMUNITY): Payer: Self-pay

## 2022-07-05 NOTE — Progress Notes (Signed)
Fax received from Dr. Galvin Proffer, DDS on 06/24/22 for medical clearance/medication hold for composites to be signed by Dr. Carlis Abbott.  Provider signed on 07/05/22, form faxed back to sender on 07/05/22, verified successful, sent to scan center.

## 2022-09-06 ENCOUNTER — Other Ambulatory Visit: Payer: Self-pay | Admitting: Vascular Surgery

## 2022-09-06 ENCOUNTER — Other Ambulatory Visit (HOSPITAL_COMMUNITY): Payer: Self-pay

## 2022-09-15 NOTE — Progress Notes (Signed)
[facility] - Clinic Note  09/26/2022   CHIEF COMPLAINT Patient presents for Retina Evaluation  HISTORY OF PRESENT ILLNESS: Dylan Anderson is a 62 y.o. male who presents to the clinic today for:  HPI     Retina Evaluation   In left eye.  This started 2 months ago.  Duration of 2 months.  Associated Symptoms Flashes, Floaters and Distortion.  I, the attending physician,  performed the HPI with the patient and updated documentation appropriately.        Comments   New pt ret eval for diabetic retinopathy OS. Pt states a few months ago he started experiencing flashes and floaters in OS. Pt states VA in OS has decreased- odd colors and darkness. OD has decreased as well but OS is worse. Pt is diabetic since 2013. His A1C in Dec was 7.4. BS this AM was 140. Pt only takes oral meds for diabetes. Pt had a stroke at the end of 2023.       Last edited by Rennis Chris, MD on 09/26/2022  3:56 PM.    Pt is here on the referral of Alma Downs, PA-C for concern of retinal hemes OU, pt is diabetic, his last A1c was 7.4 in December 2023, he has hx of strokes, but no heart attacks, pt is supposed to be on a blood thinner, but states he hasn't been able to get it filled for the past 2 weeks,   Referring physician: [facility] [address] [place],  [place] [address]  HISTORICAL INFORMATION:  Selected notes from the MEDICAL RECORD NUMBER Referred by Alma Downs, PA-C for concern of retinal hemes OU LEE:  Ocular Hx- PMH-   CURRENT MEDICATIONS: No current outpatient medications on file. (Ophthalmic Drugs)   No current facility-administered medications for this visit. (Ophthalmic Drugs)   Current Outpatient Medications (Other)  Medication Sig   clopidogrel (PLAVIX) 75 MG tablet Take 1 tablet (75 mg total) by mouth daily.   glucose blood (TRUE METRIX BLOOD GLUCOSE TEST) test strip Use as instructed. Check blood glucose level by fingerstick three  times per day. E11.65   methocarbamol (ROBAXIN) 500 MG tablet Take 1 tablet (500 mg total) by mouth every 8 (eight) hours as needed for muscle spasms.   TRUEplus Lancets 28G MISC Use as instructed. Check blood glucose level by fingerstick 3 times per day. E11.65   amLODipine (NORVASC) 5 MG tablet TAKE 1 TABLET (5 MG TOTAL) BY MOUTH DAILY.   atorvastatin (LIPITOR) 80 MG tablet TAKE 1 TABLET (80 MG TOTAL) BY MOUTH DAILY AT 6 PM.   fenofibrate (TRICOR) 48 MG tablet Take 1 tablet (48 mg total) by mouth daily.  Please make appt for refills.   insulin aspart (NOVOLOG) 100 UNIT/ML FlexPen INJECT 10 UNITS INTO THE SKIN 2 (TWO) TIMES DAILY WITH A MEAL.   Insulin Glargine (BASAGLAR KWIKPEN) 100 UNIT/ML Inject 70 Units into the skin at bedtime. (Patient not taking: Reported on 09/26/2022)   lisinopril (ZESTRIL) 5 MG tablet Take 5 mg by mouth daily.   metFORMIN (GLUCOPHAGE) 1000 MG tablet TAKE 1 TABLET (1,000 MG TOTAL) BY MOUTH 2 (TWO) TIMES DAILY WITH A MEAL.  Please make appt for refills.   metoprolol succinate (TOPROL-XL) 50 MG 24 hr tablet TAKE 1 TABLET (50 MG TOTAL) BY MOUTH AT BEDTIME.   Multiple Vitamins-Minerals (MULTIVITAMIN WITH MINERALS) tablet Take 1 tablet by mouth daily. (Patient not taking: Reported on 05/10/2022)   nitroGLYCERIN (NITROSTAT) 0.4 MG  SL tablet PLACE 1 TABLET (0.4 MG TOTAL) UNDER THE TONGUE EVERY 5 (FIVE) MINUTES AS NEEDED FOR CHEST PAIN.   pantoprazole (PROTONIX) 40 MG tablet TAKE 1 TABLET (40 MG TOTAL) BY MOUTH DAILY.  Please make appt for refills.   pregabalin (LYRICA) 100 MG capsule Take 100 mg by mouth 3 (three) times daily. (Patient not taking: Reported on 05/10/2022)   No current facility-administered medications for this visit. (Other)   REVIEW OF SYSTEMS: ROS   Positive for: Endocrine, Cardiovascular, Eyes Negative for: Constitutional, Gastrointestinal, Neurological, Skin, Genitourinary, Musculoskeletal, HENT, Respiratory, Psychiatric, Allergic/Imm, Heme/Lymph Last edited  by Thompson Grayer, COT on 09/26/2022  1:24 PM.     ALLERGIES No Known Allergies PAST MEDICAL HISTORY Past Medical History:  Diagnosis Date   Acute gout involving toe of left foot 09/21/2017   Anginal pain (HCC)    Anxiety    Arthritis    Back pain    Chest pain in adult 05/26/2018   Chronic cough 07/13/2017   Claudication (HCC)    Depression    Diabetes mellitus type II, non insulin dependent (HCC) 07/10/2012   Dyspnea    Erectile dysfunction    Gastroesophageal reflux disease 04/27/2017   Hand pain 03/18/2017   Healthcare maintenance 07/12/2017   Hip pain 05/20/2017   Hyperlipidemia 01/25/2017   Hypertension 07/10/2012   Insomnia 02/06/2017   Low back pain 01/25/2017   Peripheral artery disease (HCC) 06/07/2017   PVD (peripheral vascular disease) (HCC)    Tobacco abuse 07/10/2012   Type 2 diabetes mellitus (HCC)    Past Surgical History:  Procedure Laterality Date   ABDOMINAL AORTOGRAM W/LOWER EXTREMITY N/A 06/18/2020   Procedure: ABDOMINAL AORTOGRAM W/LOWER EXTREMITY;  Surgeon: Cephus Shelling, MD;  Location: [facility];  Service: Cardiovascular;  Laterality: N/A;   AORTA - BILATERAL FEMORAL ARTERY BYPASS GRAFT Bilateral 02/18/2021   Procedure: AORTOBIFEMORAL BYPASS GRAFT USING 16 x 8mm HEMASHIELD GOLD;  Surgeon: Cephus Shelling, MD;  Location: [facility] OR;  Service: Vascular;  Laterality: Bilateral;   CARDIAC CATHETERIZATION     ILIAC ARTERY STENT     LEFT HEART CATH AND CORONARY ANGIOGRAPHY N/A 06/19/2018   Procedure: LEFT HEART CATH AND CORONARY ANGIOGRAPHY;  Surgeon: Lennette Bihari, MD;  Location: [facility];  Service: Cardiovascular;  Laterality: N/A;   PERIPHERAL VASCULAR INTERVENTION Bilateral 06/18/2020   Procedure: PERIPHERAL VASCULAR INTERVENTION;  Surgeon: Cephus Shelling, MD;  Location: [facility];  Service: Cardiovascular;  Laterality: Bilateral;  common Iliac   stent Left    left common and left internal illiac stent    FAMILY HISTORY Family History  Problem Relation Age of Onset   Heart failure Mother    Osteoarthritis Mother    Glaucoma Sister    COPD Sister    Stroke Sister    Heart attack Maternal Aunt    Diabetes Maternal Uncle    Heart attack Maternal Uncle    SOCIAL HISTORY Social History   Tobacco Use   Smoking status: Every Day    Packs/day: 0.25    Years: 17.00    Additional pack years: 0.00    Total pack years: 4.25    Types: Cigarettes    Start date: 04/04/1978   Smokeless tobacco: Never   Tobacco comments:    Max 1 ppd  Vaping Use   Vaping Use: Never used  Substance Use Topics   Alcohol use: Yes    Comment: occ   Drug use: No  OPHTHALMIC EXAM:  Base Eye Exam     Visual Acuity (Snellen - Linear)       Right Left   Dist Clive 20/30 -2 20/40 +2   Dist ph  20/30 20/30         Tonometry (Tonopen, 1:39 PM)       Right Left   Pressure 15 12         Pupils       Dark Light Shape React APD   Right 2 2 Round NR None   Left 2 1 Round Minimal None         Visual Fields (Counting fingers)       Left Right    Full Full         Extraocular Movement       Right Left    Full, Ortho Full, Ortho         Neuro/Psych     Oriented x3: Yes   Mood/Affect: Normal         Dilation     Both eyes: 1.0% Mydriacyl, 2.5% Phenylephrine @ 1:40 PM           Slit Lamp and Fundus Exam     Slit Lamp Exam       Right Left   Lids/Lashes Dermatochalasis - upper lid, Ptosis Dermatochalasis - upper lid, mild Ptosis   Conjunctiva/Sclera nasal and temporal pinguecula nasal and temporal pinguecula   Cornea trace PEE mild arcus   Anterior Chamber deep, clear, narrow temporal angle deep, clear, narrow temporal angle   Iris Round and moderately dilated, No NVI Round and dilated, No NVI   Lens 2+ Nuclear sclerosis, 2-3+ Cortical cataract, 1+Posterior subcapsular cataract inferior 2+ Nuclear sclerosis, 2-3+ Cortical cataract, 1+Posterior subcapsular  cataract inferior   Anterior Vitreous Vitreous syneresis mild syneresis         Fundus Exam       Right Left   Disc Pink and Sharp Pink and Sharp, mild PPA   C/D Ratio 0.5 0.4   Macula Flat, Blunted foveal reflex, scattered MA Flat, Blunted foveal reflex, scattered MA, subretinal blot hemes IN mac   Vessels attenuated, Tortuous, copper wiring attenuated, Tortuous, copper wiring   Periphery Attached, scattered MA / DBH greatest posteriorly Attached, scattered MA / DBH greatest posteriorly           Refraction     Manifest Refraction       Sphere Cylinder Axis Dist VA   Right -1.50 +0.75 180 20/25+1   Left -1.75 +1.25 175 20/25           IMAGING AND PROCEDURES  Imaging and Procedures for 09/26/2022  OCT, Retina - OU - Both Eyes       Right Eye Quality was good. Central Foveal Thickness: 329. Progression has no prior data. Findings include normal foveal contour, no IRF, no SRF, vitreomacular adhesion (Blunted foveal contour, no DME).   Left Eye Quality was good. Central Foveal Thickness: 322. Progression has no prior data. Findings include normal foveal contour, no IRF, no SRF, vitreomacular adhesion (Blunted foveal contour, irregular lamination, no frank edema).   Notes *Images captured and stored on drive  Diagnosis / Impression:  OD: Blunted foveal contour, no DME OS: Blunted foveal contour, irregular lamination, no frank edema  Clinical management:  See below  Abbreviations: NFP - Normal foveal profile. CME - cystoid macular edema. PED - pigment epithelial detachment. IRF - intraretinal fluid. SRF - subretinal fluid. EZ - ellipsoid zone.  ERM - epiretinal membrane. ORA - outer retinal atrophy. ORT - outer retinal tubulation. SRHM - subretinal hyper-reflective material. IRHM - intraretinal hyper-reflective material      Fluorescein Angiography Optos (Transit OS)       Right Eye Progression has no prior data. Early phase findings include microaneurysm.  Mid/Late phase findings include leakage, microaneurysm (No NV, late leakage from MA, focal perivascular leakage ST periphery).   Left Eye Progression has no prior data. Early phase findings include blockage, delayed filling, staining, microaneurysm. Mid/Late phase findings include blockage, leakage, staining, microaneurysm (Mild perivascular leakage, no NV).   Notes **Images stored on drive**  Impression: OD: No NV, late leakage from MA, focal perivascular leakage ST periphery OS: Mild perivascular leakage, no NV           ASSESSMENT/PLAN:   ICD-10-CM   1. Moderate nonproliferative diabetic retinopathy of both eyes without macular edema associated with type 2 diabetes mellitus (HCC)  E11.3393 OCT, Retina - OU - Both Eyes    Fluorescein Angiography Optos (Transit OS)    2. Current use of insulin (HCC)  Z79.4     3. Long term (current) use of oral hypoglycemic drugs  Z79.84     4. Essential hypertension  I10     5. Hypertensive retinopathy of both eyes  H35.033 Fluorescein Angiography Optos (Transit OS)    6. Retinal hemorrhage of both eyes  H35.63     7. Combined forms of age-related cataract of both eyes  H25.813      1-3. Moderate nonproliferative diabetic retinopathy w/o DME, both eyes - The incidence, risk factors for progression, natural history and treatment options for diabetic retinopathy were discussed with patient.   - The need for close monitoring of blood glucose, blood pressure, and serum lipids, avoiding cigarette or any type of tobacco, and the need for long term follow up was also discussed with patient. - exam shows scattered MA/DBH OU; no NV OU  - FA (06.24.24) shows OD: No NV, late leakage from MA, focal perivascular leakage ST periphery; OS: Mild perivascular leakage, no NV - OCT without diabetic macular edema, both eyes  - f/u in 2-3 mos -- DFE/OCT  4,5. Hypertensive retinopathy OU - discussed importance of tight BP control - monitor  6. Focal  subretinal hemorrhages OU (OS > OD)  - likely multifactorial -- DM, HTN, blood thinner use  - discussed findings, prognosis  - monitor  7. Mixed Cataract OU - The symptoms of cataract, surgical options, and treatments and risks were discussed with patient. - discussed diagnosis and progression - under the expert care of [facility] - clear from a retina standpoint to proceed with cataract surgery when pt and surgeon are ready   Ophthalmic Meds Ordered this visit:  No orders of the defined types were placed in this encounter.    Return for f/u 2-3 months, NPDR OU, DFE, OCT.  There are no Patient Instructions on file for this visit.  Explained the diagnoses, plan, and follow up with the patient and they expressed understanding.  Patient expressed understanding of the importance of proper follow up care.    This document serves as a record of services personally performed by Karie Chimera, MD, PhD. It was created on their behalf by De Blanch, an ophthalmic technician. The creation of this record is the provider's dictation and/or activities during the visit.    Electronically signed by: De Blanch, OA, 09/26/22  3:58 PM  This document serves as a  record of services personally performed by Karie Chimera, MD, PhD. It was created on their behalf by Glee Arvin. Manson Passey, OA an ophthalmic technician. The creation of this record is the provider's dictation and/or activities during the visit.    Electronically signed by: Glee Arvin. Manson Passey, [place] 06.24.2024 3:58 PM   Karie Chimera, M.D., Ph.D. Diseases & Surgery of the Retina and Vitreous [facility] 09/26/2022  I have reviewed the above documentation for accuracy and completeness, and I agree with the above. Karie Chimera, M.D., Ph.D. 09/26/22 4:06 PM   Abbreviations: M myopia (nearsighted); A astigmatism; H hyperopia (farsighted); P presbyopia; Mrx spectacle prescription;  CTL contact lenses; OD  right eye; OS left eye; OU both eyes  XT exotropia; ET esotropia; PEK punctate epithelial keratitis; PEE punctate epithelial erosions; DES dry eye syndrome; MGD meibomian gland dysfunction; ATs artificial tears; PFAT's preservative free artificial tears; NSC nuclear sclerotic cataract; PSC posterior subcapsular cataract; ERM epi-retinal membrane; PVD posterior vitreous detachment; RD retinal detachment; DM diabetes mellitus; DR diabetic retinopathy; NPDR non-proliferative diabetic retinopathy; PDR proliferative diabetic retinopathy; CSME clinically significant macular edema; DME diabetic macular edema; dbh dot blot hemorrhages; CWS cotton wool spot; POAG primary open angle glaucoma; C/D cup-to-disc ratio; HVF humphrey visual field; GVF goldmann visual field; OCT optical coherence tomography; IOP intraocular pressure; BRVO Branch retinal vein occlusion; CRVO central retinal vein occlusion; CRAO central retinal artery occlusion; BRAO branch retinal artery occlusion; RT retinal tear; SB scleral buckle; PPV pars plana vitrectomy; VH Vitreous hemorrhage; PRP panretinal laser photocoagulation; IVK intravitreal kenalog; VMT vitreomacular traction; MH Macular hole;  NVD neovascularization of the disc; NVE neovascularization elsewhere; AREDS age related eye disease study; ARMD age related macular degeneration; POAG primary open angle glaucoma; EBMD epithelial/anterior basement membrane dystrophy; ACIOL anterior chamber intraocular lens; IOL intraocular lens; PCIOL posterior chamber intraocular lens; Phaco/IOL phacoemulsification with intraocular lens placement; PRK photorefractive keratectomy; LASIK laser assisted in situ keratomileusis; HTN hypertension; DM diabetes mellitus; COPD chronic obstructive pulmonary disease

## 2022-09-20 ENCOUNTER — Other Ambulatory Visit (HOSPITAL_COMMUNITY): Payer: Self-pay

## 2022-09-20 MED ORDER — CLOPIDOGREL BISULFATE 75 MG PO TABS
75.0000 mg | ORAL_TABLET | Freq: Every day | ORAL | 11 refills | Status: AC
  Filled 2022-09-20 – 2022-09-28 (×2): qty 30, 30d supply, fill #0

## 2022-09-24 IMAGING — CT CT HEAD W/O CM
4 series · 15 of 47 positions shown, 17 images · non-contrast
Comparison: None.

CLINICAL DATA: Head trauma

EXAM:
CT HEAD WITHOUT CONTRAST
TECHNIQUE: Contiguous axial images were obtained from the base of the skull
through the vertex without intravenous contrast.

[Series 2: head wo · axial · 0.45mm/px · z∈[+1289,+1404]mm · 7 of 31 slices shown, 9 images]
[im 4/31  brain]
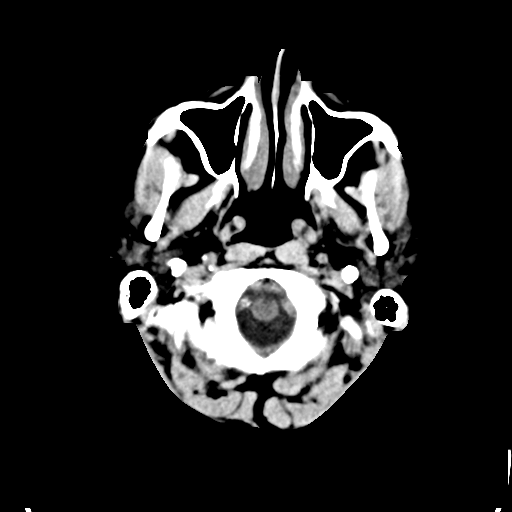
[im 4/31  bone]
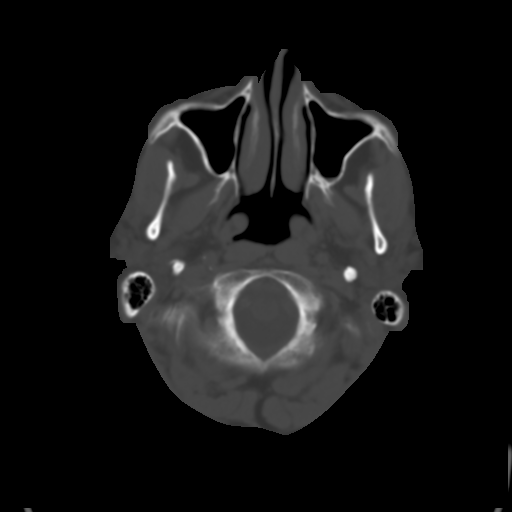
[im 8/31  brain]
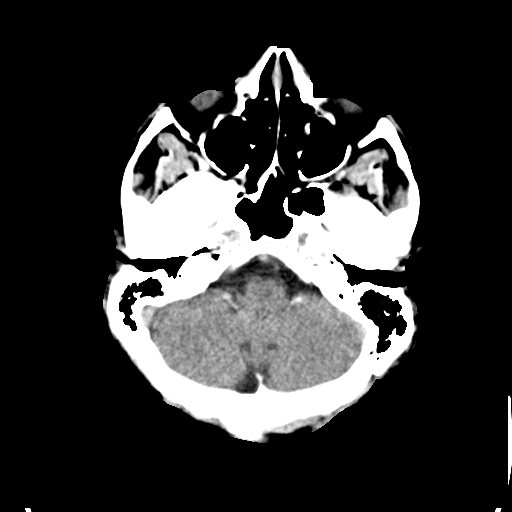
[im 12/31  brain]
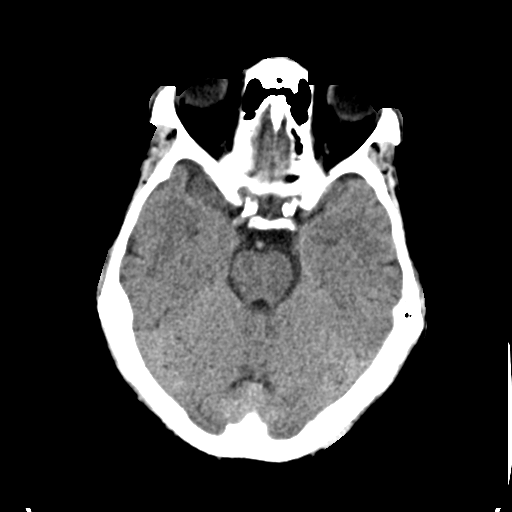
[im 16/31  brain]
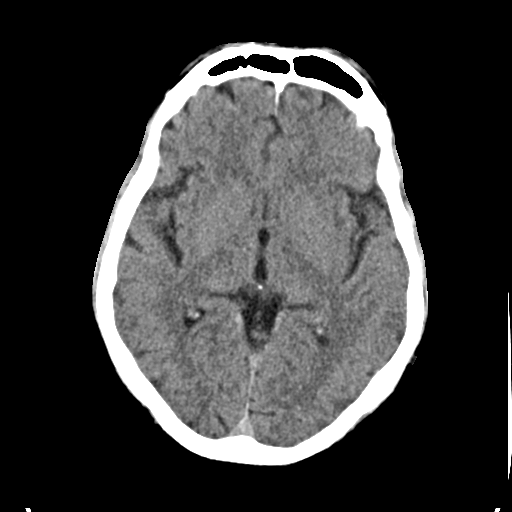
[im 19/31  brain]
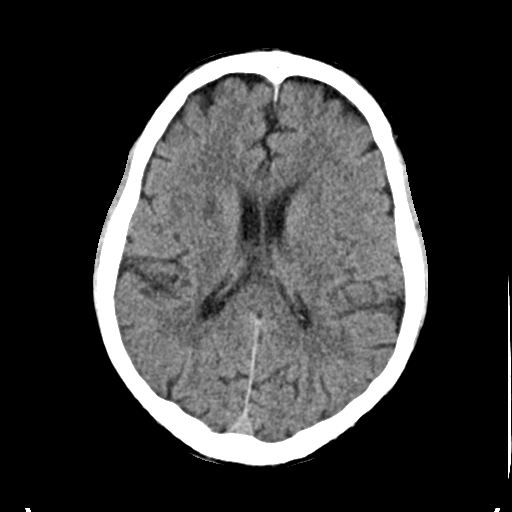
[im 19/31  bone]
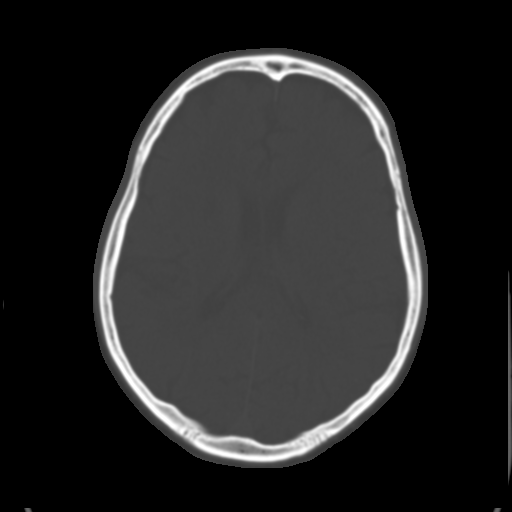
[im 23/31  brain]
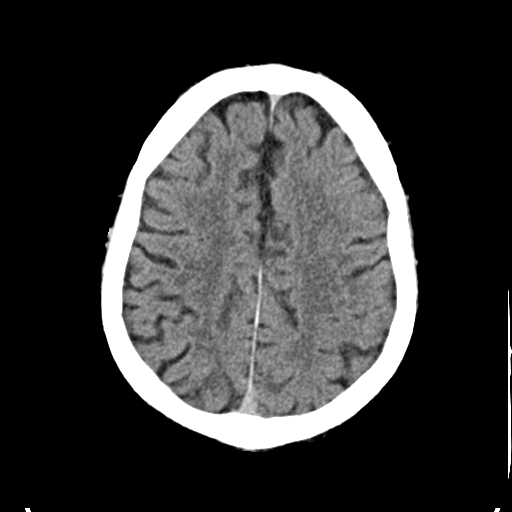
[im 27/31  brain]
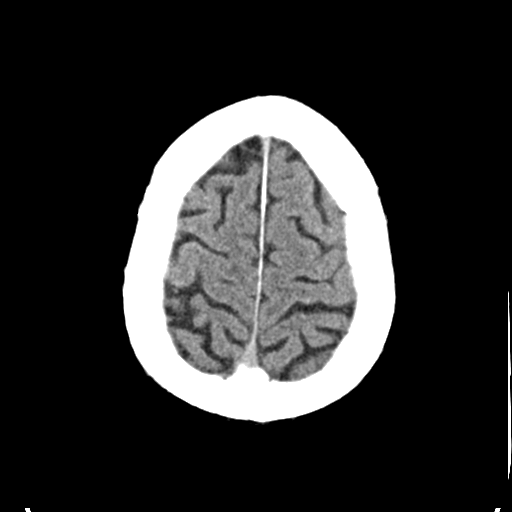

[Series 3: head bone · axial · 0.45mm/px · z∈[+1288,+1304]mm · 2 of 78 slices shown]
[im 8/78  bone]
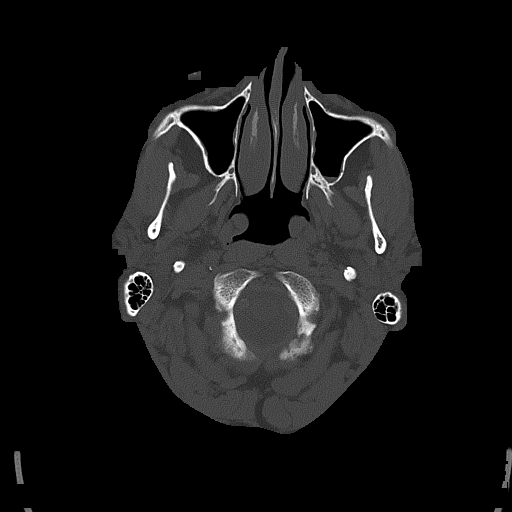
[im 16/78  bone]
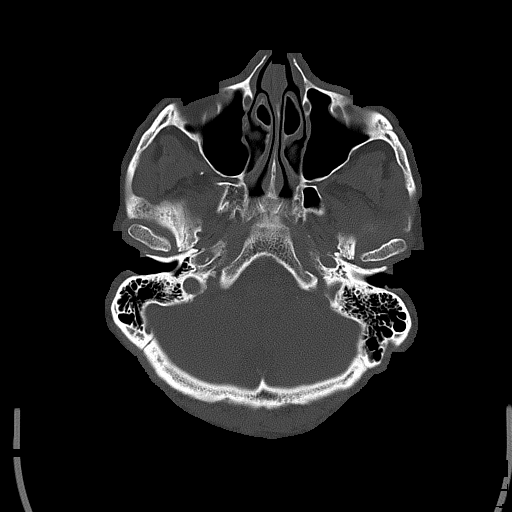

[Series 4: coronal soft · coronal · 0.35mm/px · 3 of 67 slices shown]
[im 23/67  brain]
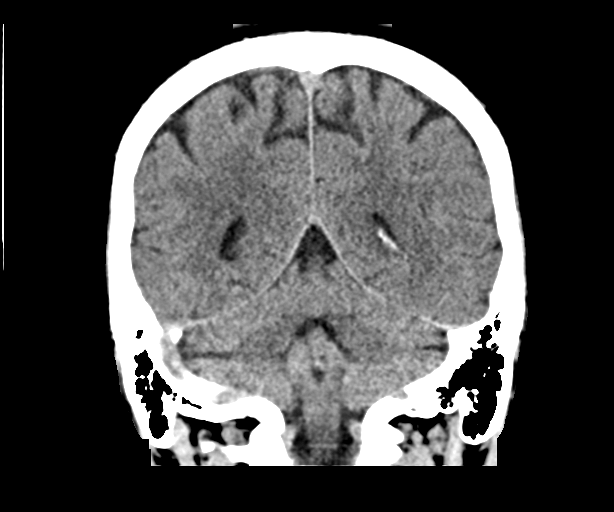
[im 30/67  brain]
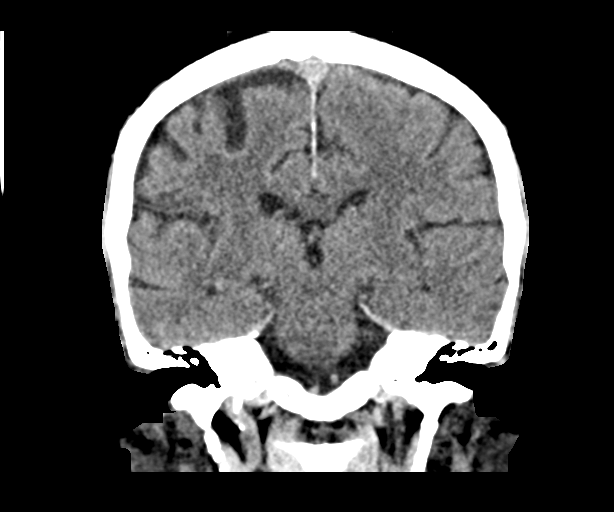
[im 37/67  brain]
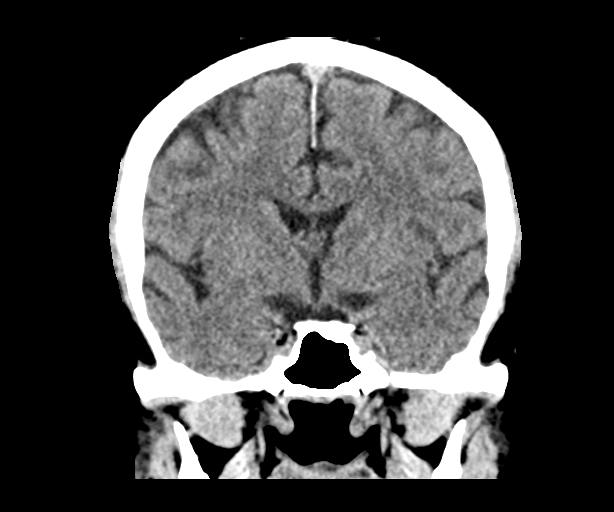

[Series 5: sag soft · sagittal · 0.30mm/px · 3 of 67 slices shown]
[im 23/67  brain]
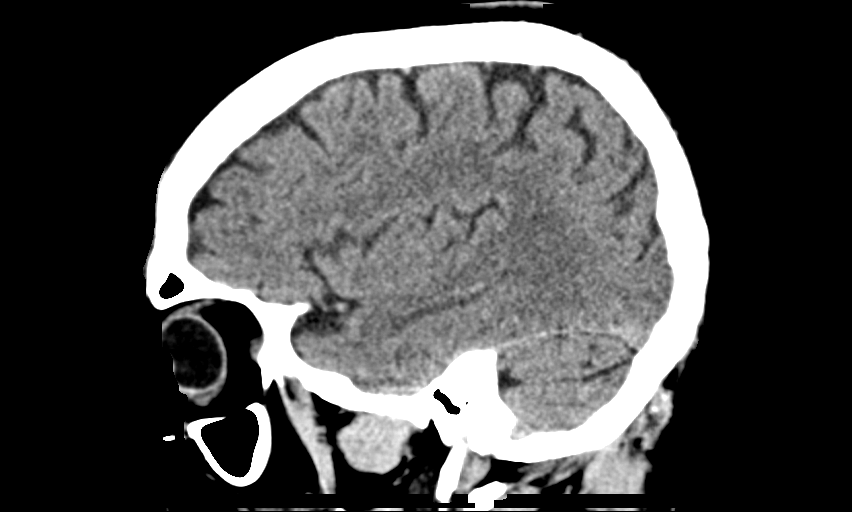
[im 34/67  brain]
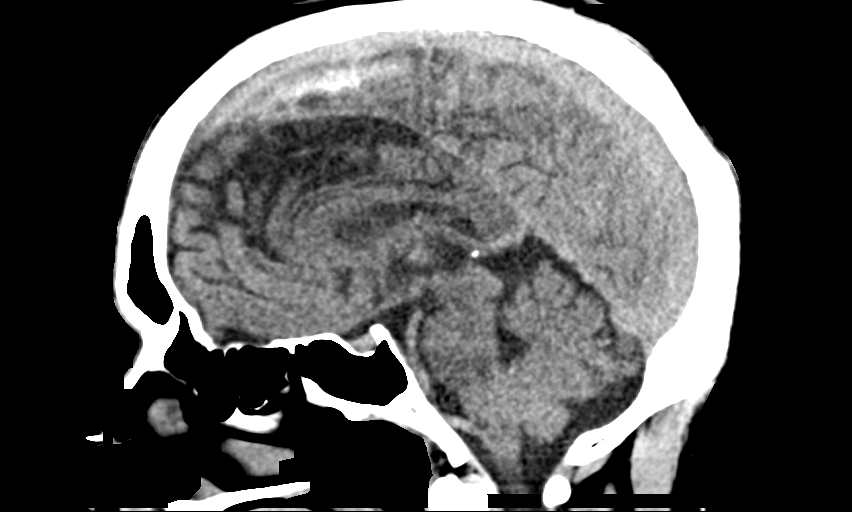
[im 45/67  brain]
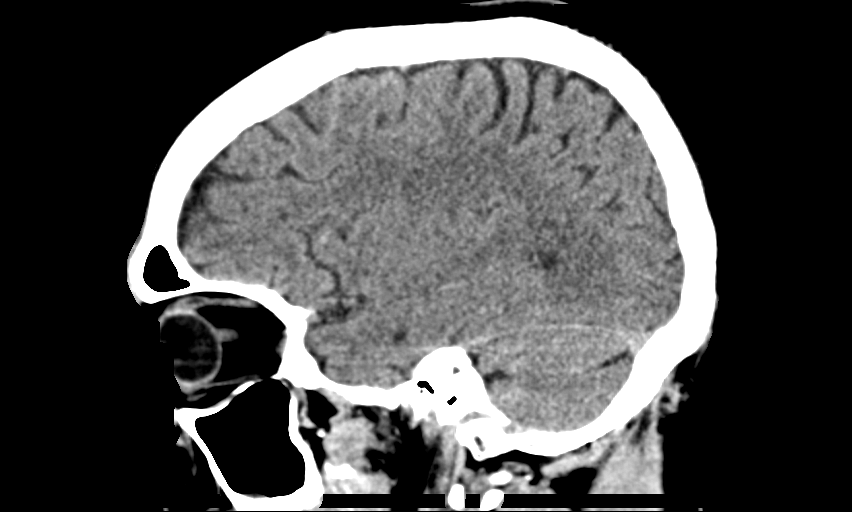

[15 of 47 positions shown; findings below may reference images not displayed]

FINDINGS: Brain: There is no mass, hemorrhage or extra-axial collection. The
size and configuration of the ventricles and extra-axial CSF spaces
are normal. There is mild hypoattenuation of the white matter, most
commonly indicating chronic small vessel disease.

Vascular: No abnormal hyperdensity of the major intracranial
arteries or dural venous sinuses. No intracranial atherosclerosis.

Skull: The visualized skull base, calvarium and extracranial soft
tissues are normal.

Sinuses/Orbits: No fluid levels or advanced mucosal thickening of
the visualized paranasal sinuses. No mastoid or middle ear effusion.
The orbits are normal.
IMPRESSION: Mild chronic small vessel disease without acute intracranial
abnormality.

## 2022-09-24 IMAGING — CT CT L SPINE W/O CM
4 of 5 series · 12 of 33 positions shown, 14 images · non-contrast
Comparison: None.

CLINICAL DATA: Low back pain radiating to both lower extremities

EXAM:
CT LUMBAR SPINE WITHOUT CONTRAST
TECHNIQUE: Multidetector CT imaging of the lumbar spine was performed without
intravenous contrast administration. Multiplanar CT image
reconstructions were also generated.

[Series 7: coronal bone · coronal · 0.32mm/px · 3 of 65 slices shown]
[im 13/65  bone]
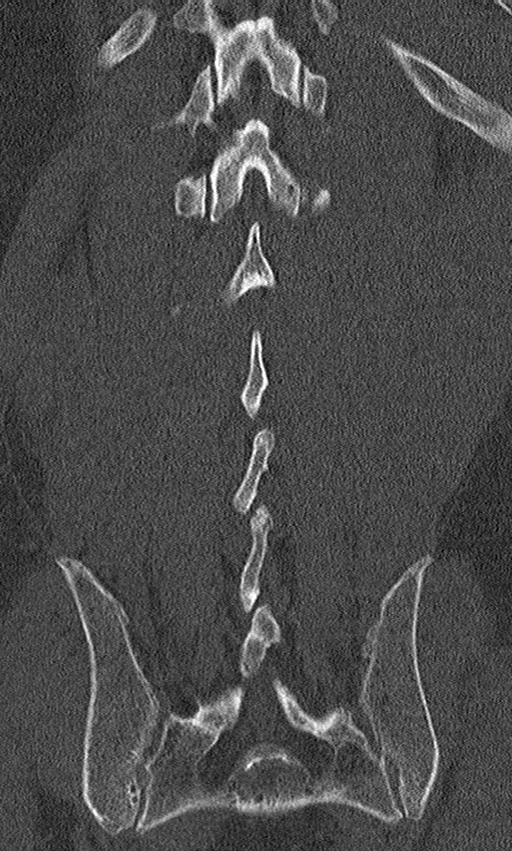
[im 26/65  bone]
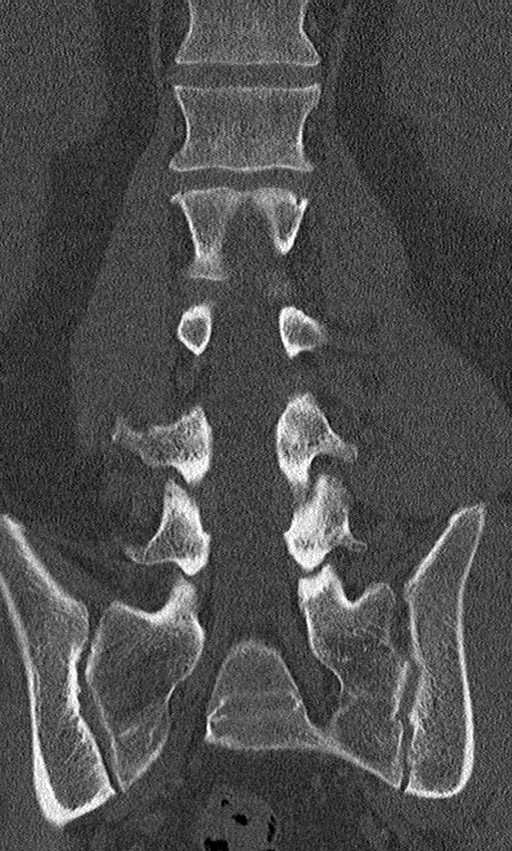
[im 39/65  bone]
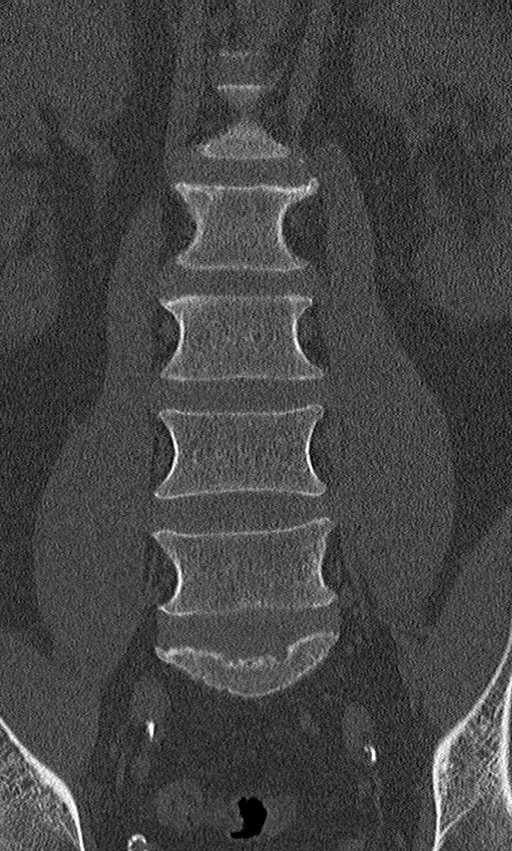

[Series 8: sagittal soft · sagittal · 0.32mm/px · 5 of 61 slices shown, 6 images]
[im 21/61  bone]
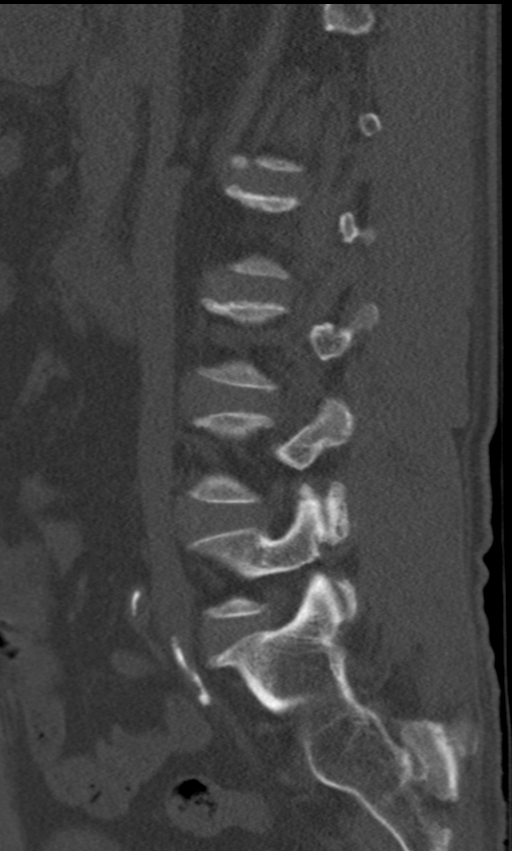
[im 26/61  bone]
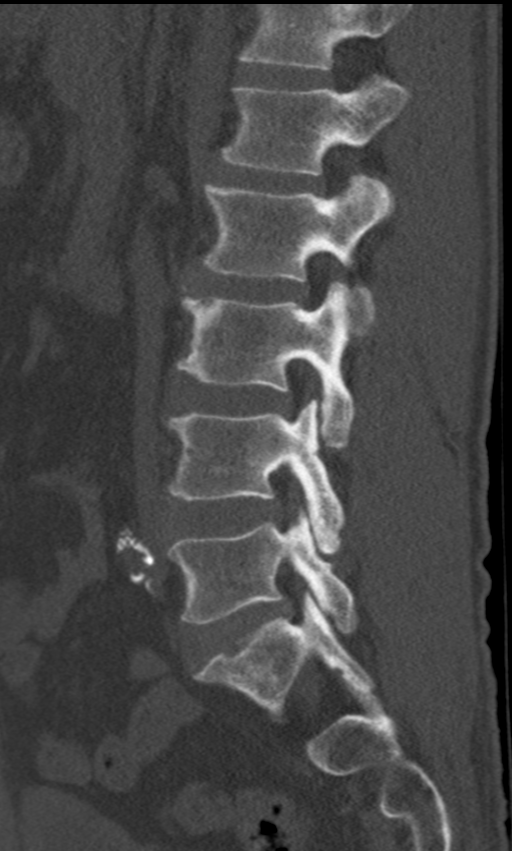
[im 31/61  soft-tissue]
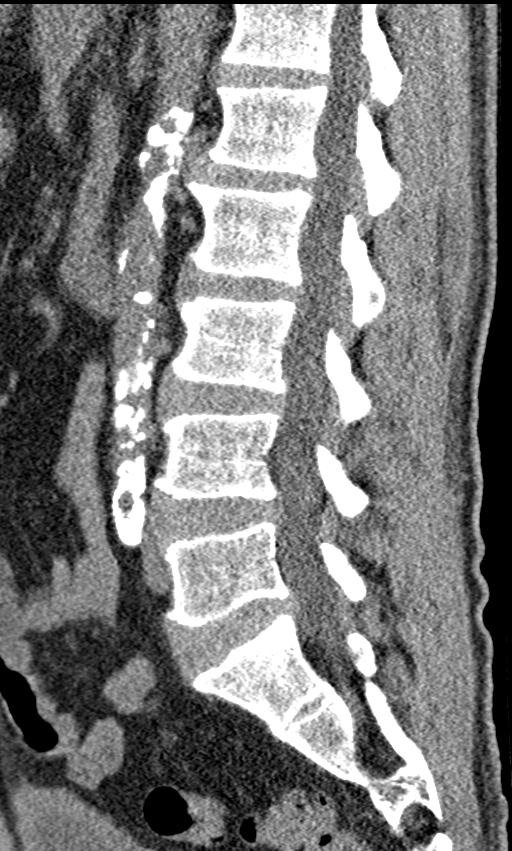
[im 31/61  bone]
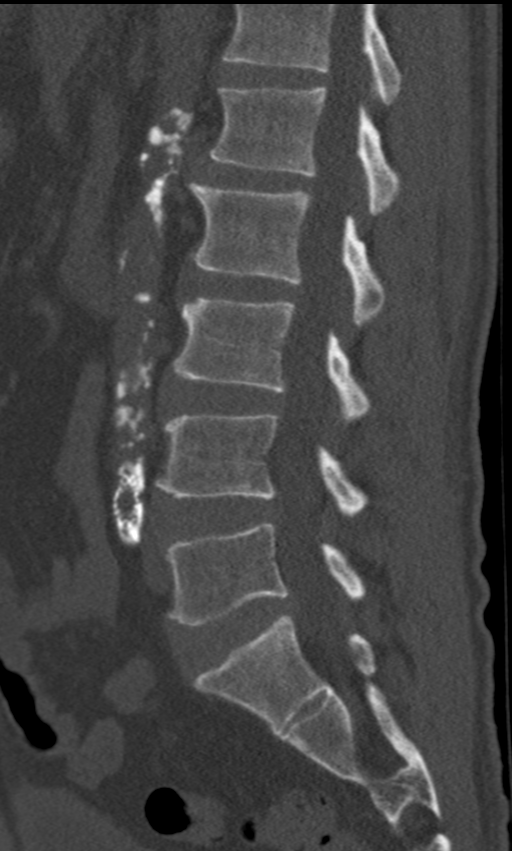
[im 36/61  bone]
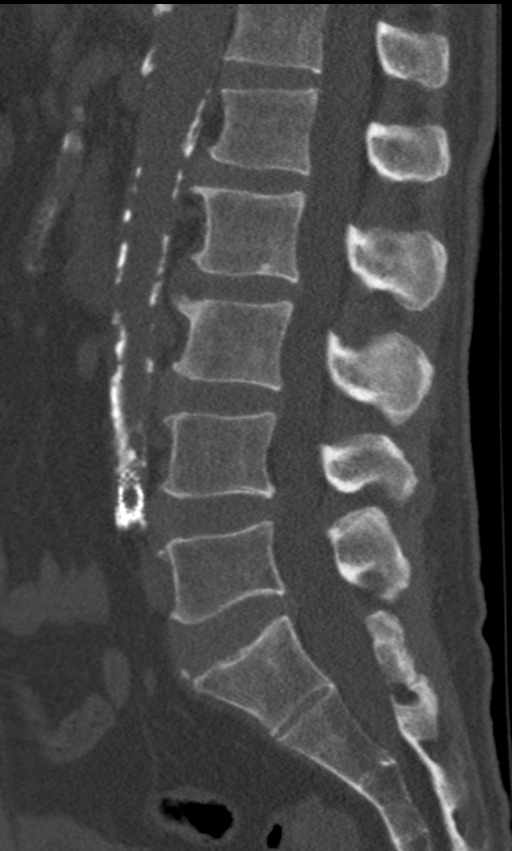
[im 41/61  bone]
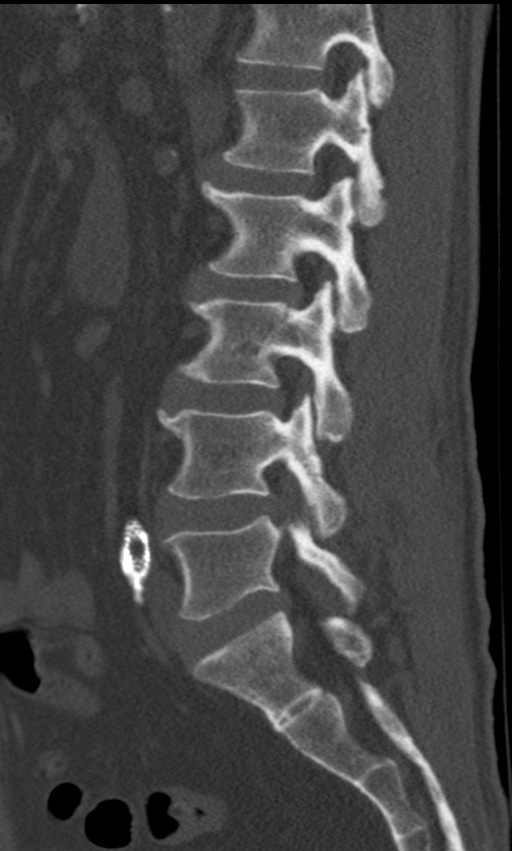

[Series 9: orthogonal axials bone · axial · 0.23mm/px · z∈[+792,+854]mm · 2 of 95 slices shown, 3 images (1 of 2)]
[im 32/95  soft-tissue]
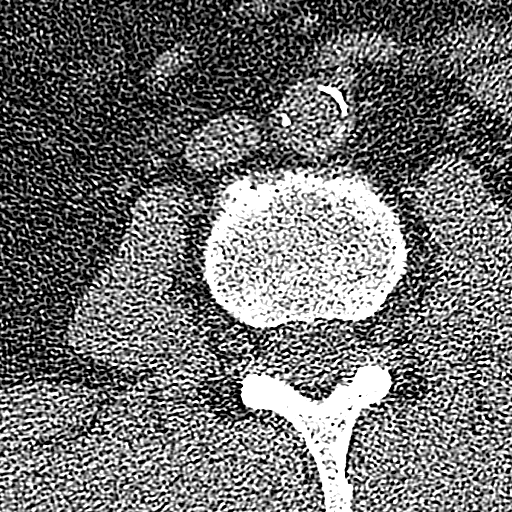
[im 32/95  bone]
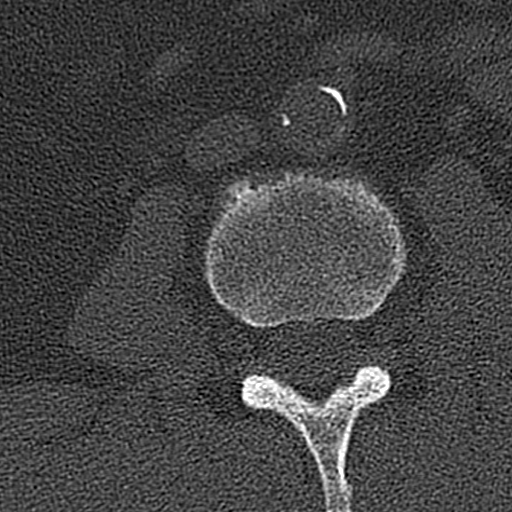
[im 63/95  bone]
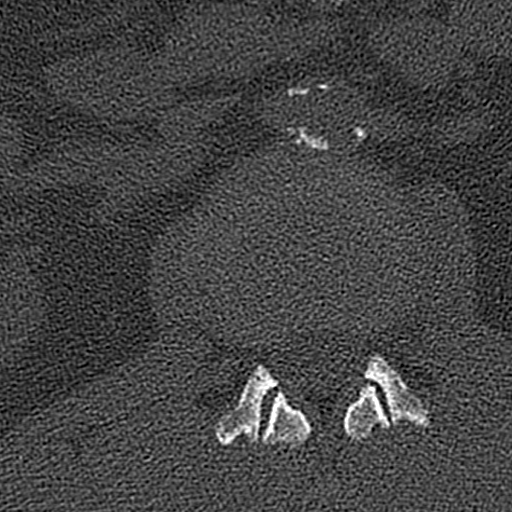

[Series 10: orthogonal axials bone · axial · 0.23mm/px · z∈[+689,+755]mm · 2 of 101 slices shown (2 of 2)]
[im 34/101  bone]
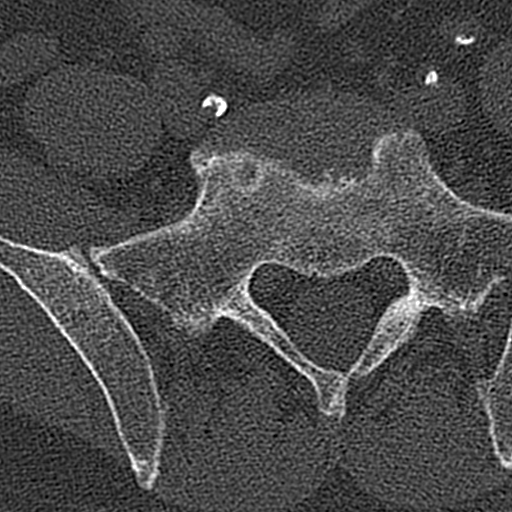
[im 67/101  bone]
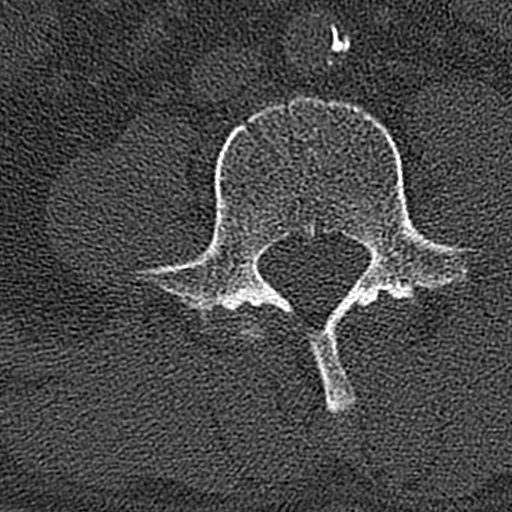

[12 of 33 positions shown; findings below may reference images not displayed]

FINDINGS: Segmentation: 5 lumbar type vertebrae.

Alignment: Normal.

Vertebrae: No acute fracture or focal pathologic process.

Paraspinal and other soft tissues: Calcific aortic atherosclerosis.
Bilateral common iliac stents.

Disc levels: There is no spinal canal stenosis or nerve root
impingement.
IMPRESSION: 1. No acute fracture or static subluxation of the lumbar spine.

Aortic Atherosclerosis ([id number]-8OA.A).

## 2022-09-26 ENCOUNTER — Ambulatory Visit (INDEPENDENT_AMBULATORY_CARE_PROVIDER_SITE_OTHER): Payer: Medicaid Other | Admitting: Ophthalmology

## 2022-09-26 ENCOUNTER — Encounter (INDEPENDENT_AMBULATORY_CARE_PROVIDER_SITE_OTHER): Payer: Self-pay | Admitting: Ophthalmology

## 2022-09-26 DIAGNOSIS — E113393 Type 2 diabetes mellitus with moderate nonproliferative diabetic retinopathy without macular edema, bilateral: Secondary | ICD-10-CM

## 2022-09-26 DIAGNOSIS — Z7984 Long term (current) use of oral hypoglycemic drugs: Secondary | ICD-10-CM | POA: Diagnosis not present

## 2022-09-26 DIAGNOSIS — I10 Essential (primary) hypertension: Secondary | ICD-10-CM | POA: Diagnosis not present

## 2022-09-26 DIAGNOSIS — Z794 Long term (current) use of insulin: Secondary | ICD-10-CM

## 2022-09-26 DIAGNOSIS — H3581 Retinal edema: Secondary | ICD-10-CM

## 2022-09-26 DIAGNOSIS — H25813 Combined forms of age-related cataract, bilateral: Secondary | ICD-10-CM

## 2022-09-26 DIAGNOSIS — H35033 Hypertensive retinopathy, bilateral: Secondary | ICD-10-CM

## 2022-09-26 DIAGNOSIS — H3563 Retinal hemorrhage, bilateral: Secondary | ICD-10-CM

## 2022-09-28 ENCOUNTER — Other Ambulatory Visit (HOSPITAL_COMMUNITY): Payer: Self-pay

## 2022-11-13 IMAGING — DX DG CHEST 1V PORT
1 series · 1 of 1 positions shown · non-contrast
Comparison: June 03, 2018

CLINICAL DATA: Status post aortobifemoral bypass graft placement.

EXAM:
PORTABLE CHEST 1 VIEW

[chest]
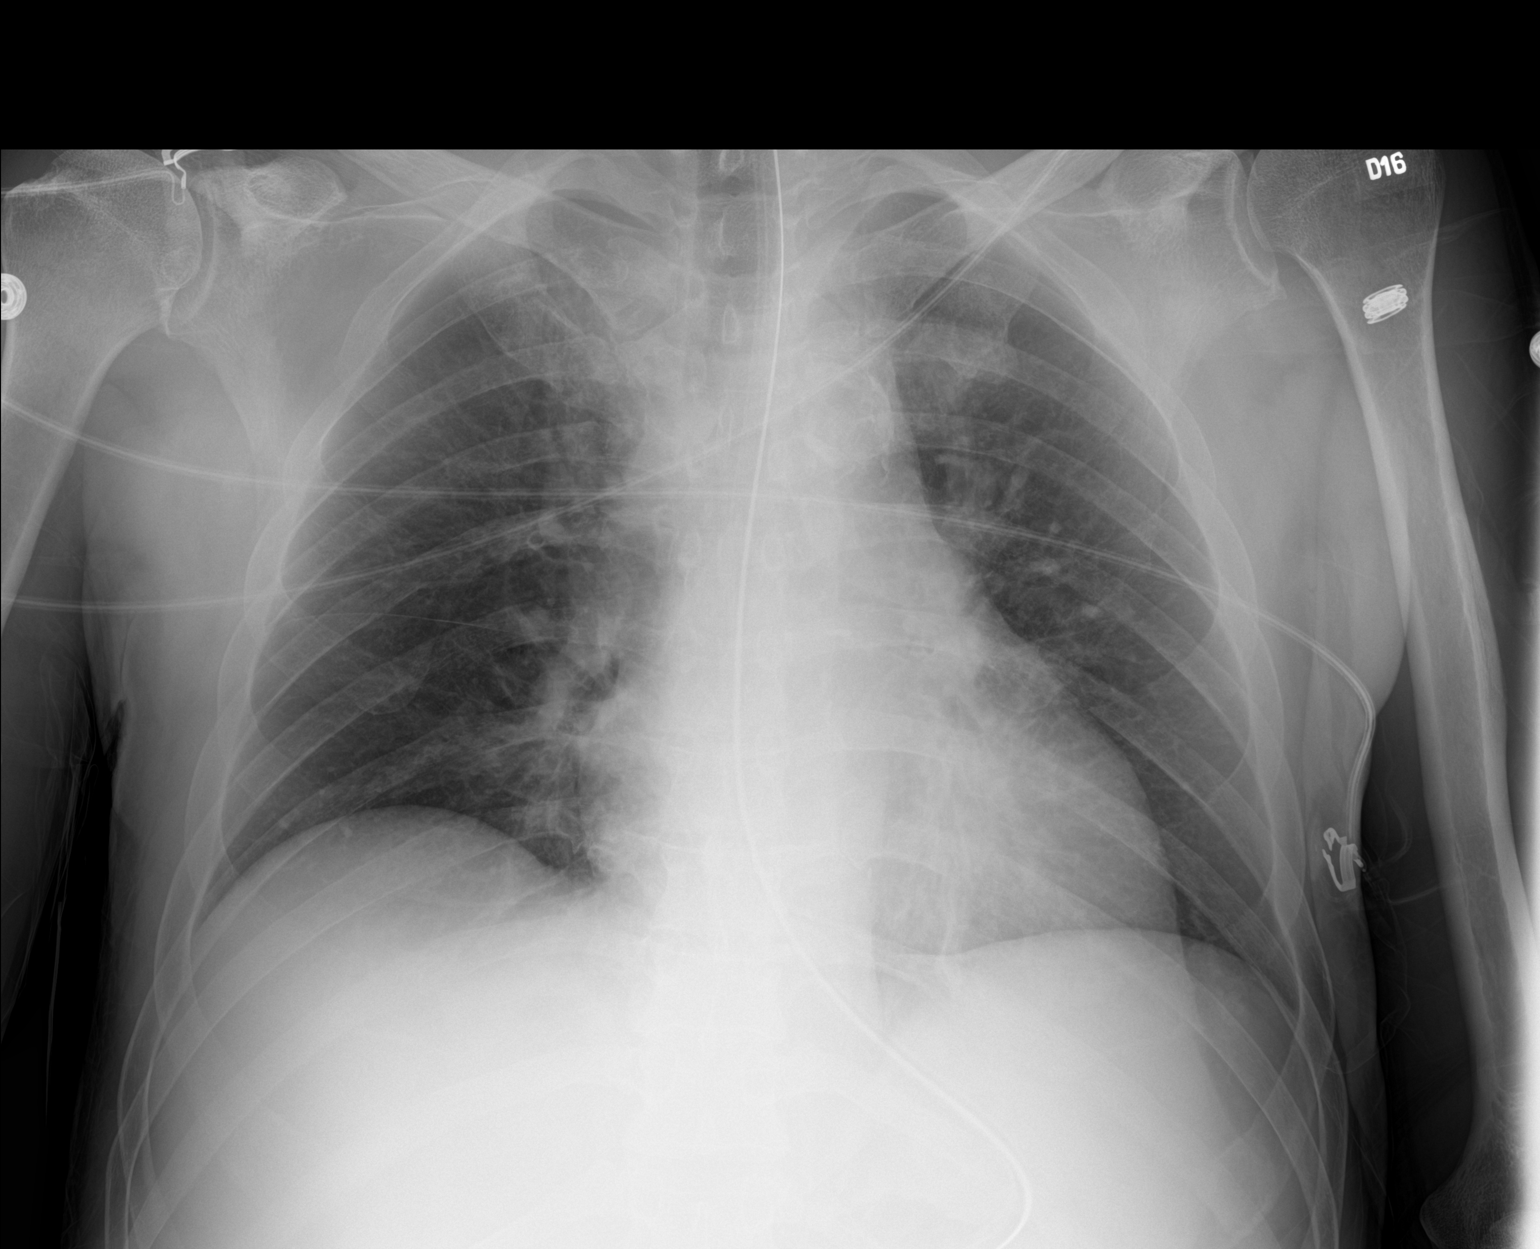

[1 of 1 positions shown; findings below may reference images not displayed]

FINDINGS: A nasogastric tube is seen with its distal end extending into the
body of the stomach. The heart size and mediastinal contours are
within normal limits. Marked severity calcification of the aortic
arch is noted. Both lungs are clear. The visualized skeletal
structures are unremarkable.
IMPRESSION: No active disease.

## 2022-11-13 IMAGING — DX DG ABD PORTABLE 1V
1 series · 1 of 1 positions shown · non-contrast
Comparison: None.

CLINICAL DATA: Status post aortobifemoral bypass graft placement.

EXAM:
PORTABLE ABDOMEN - 1 VIEW

[abdomen]
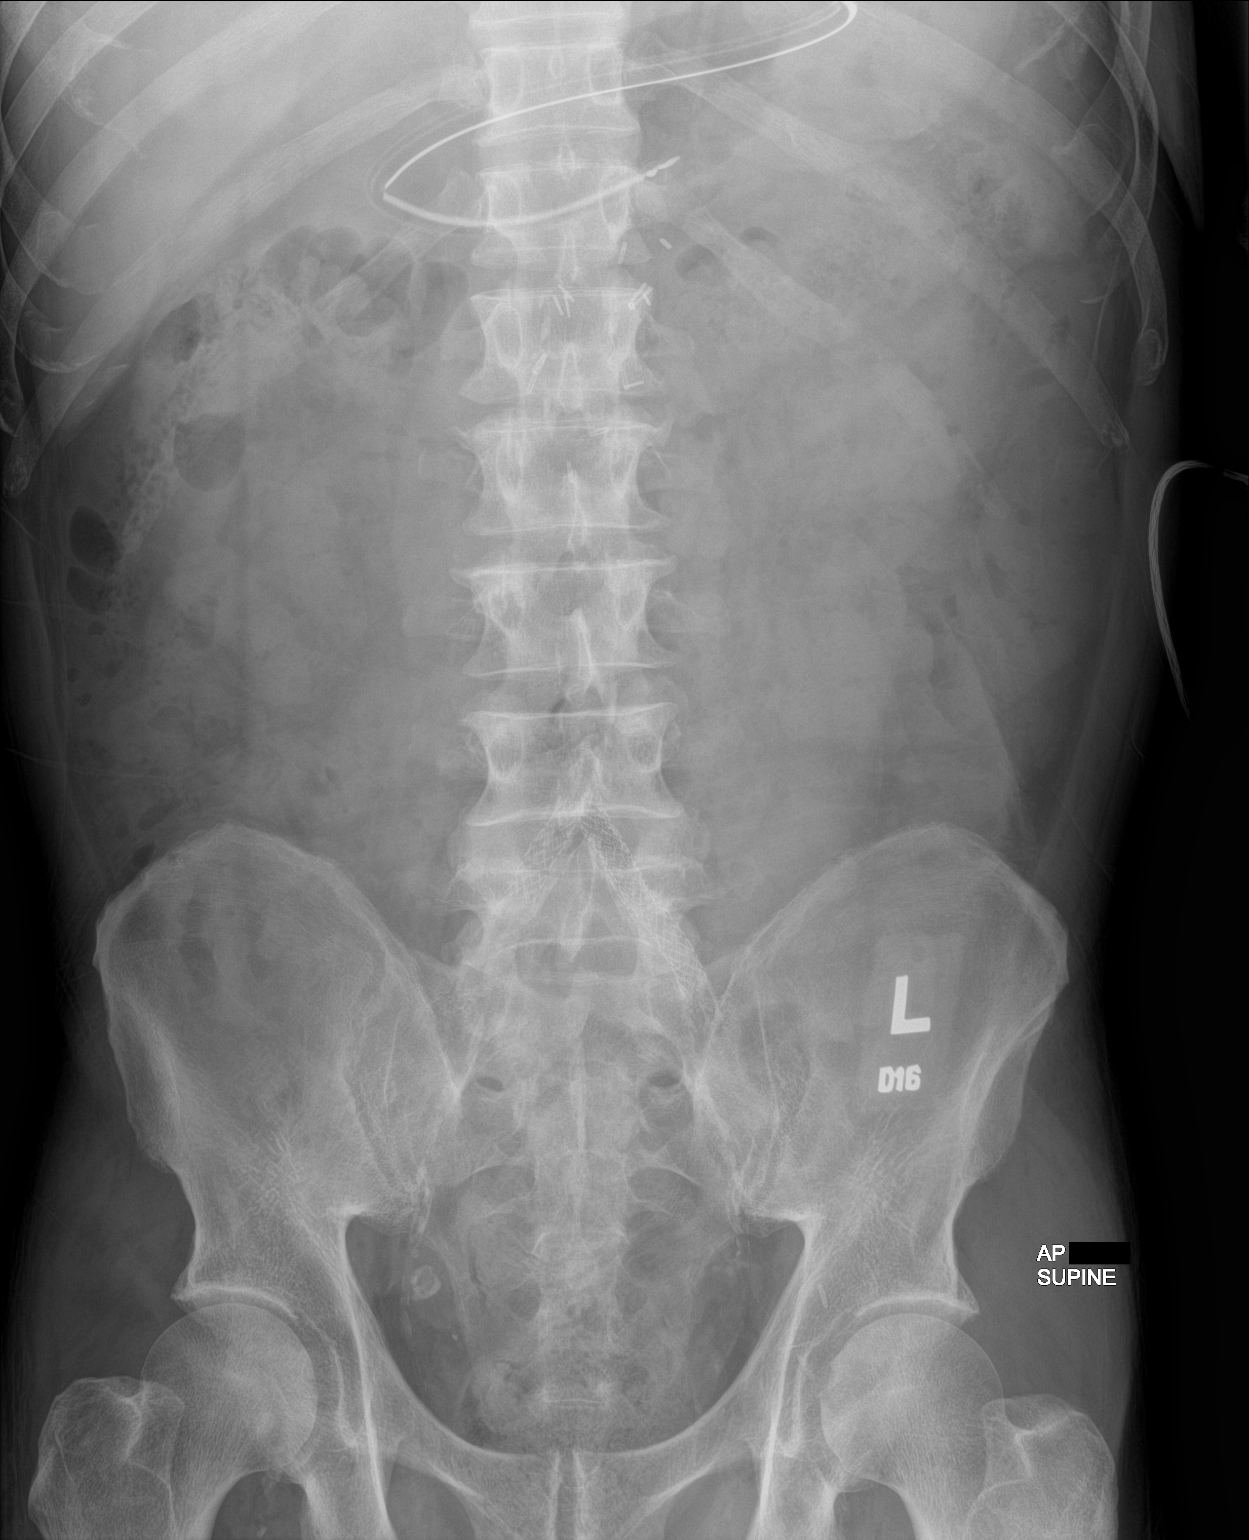

[1 of 1 positions shown; findings below may reference images not displayed]

FINDINGS: A nasogastric tube is seen with its distal tip looped within the
body of the stomach. The bowel gas pattern is normal. Radiopaque
surgical clips are seen overlying the midline of the upper abdomen.
Radiopaque vascular grafts are seen within the expected region of
the bilateral common iliac arteries. No radio-opaque calculi or
other significant radiographic abnormality are seen.
IMPRESSION: 1. Nasogastric tube looped within the body of the stomach.
2. No evidence of bowel obstruction.

## 2022-11-14 IMAGING — DX DG CHEST 1V PORT
1 series · 1 of 1 positions shown · non-contrast
Comparison: 02/18/2021

CLINICAL DATA: Aortoiliac occlusive disease; technologist notes
patient reporting slight cough and chest pain

EXAM:
PORTABLE CHEST 1 VIEW

[chest ap]
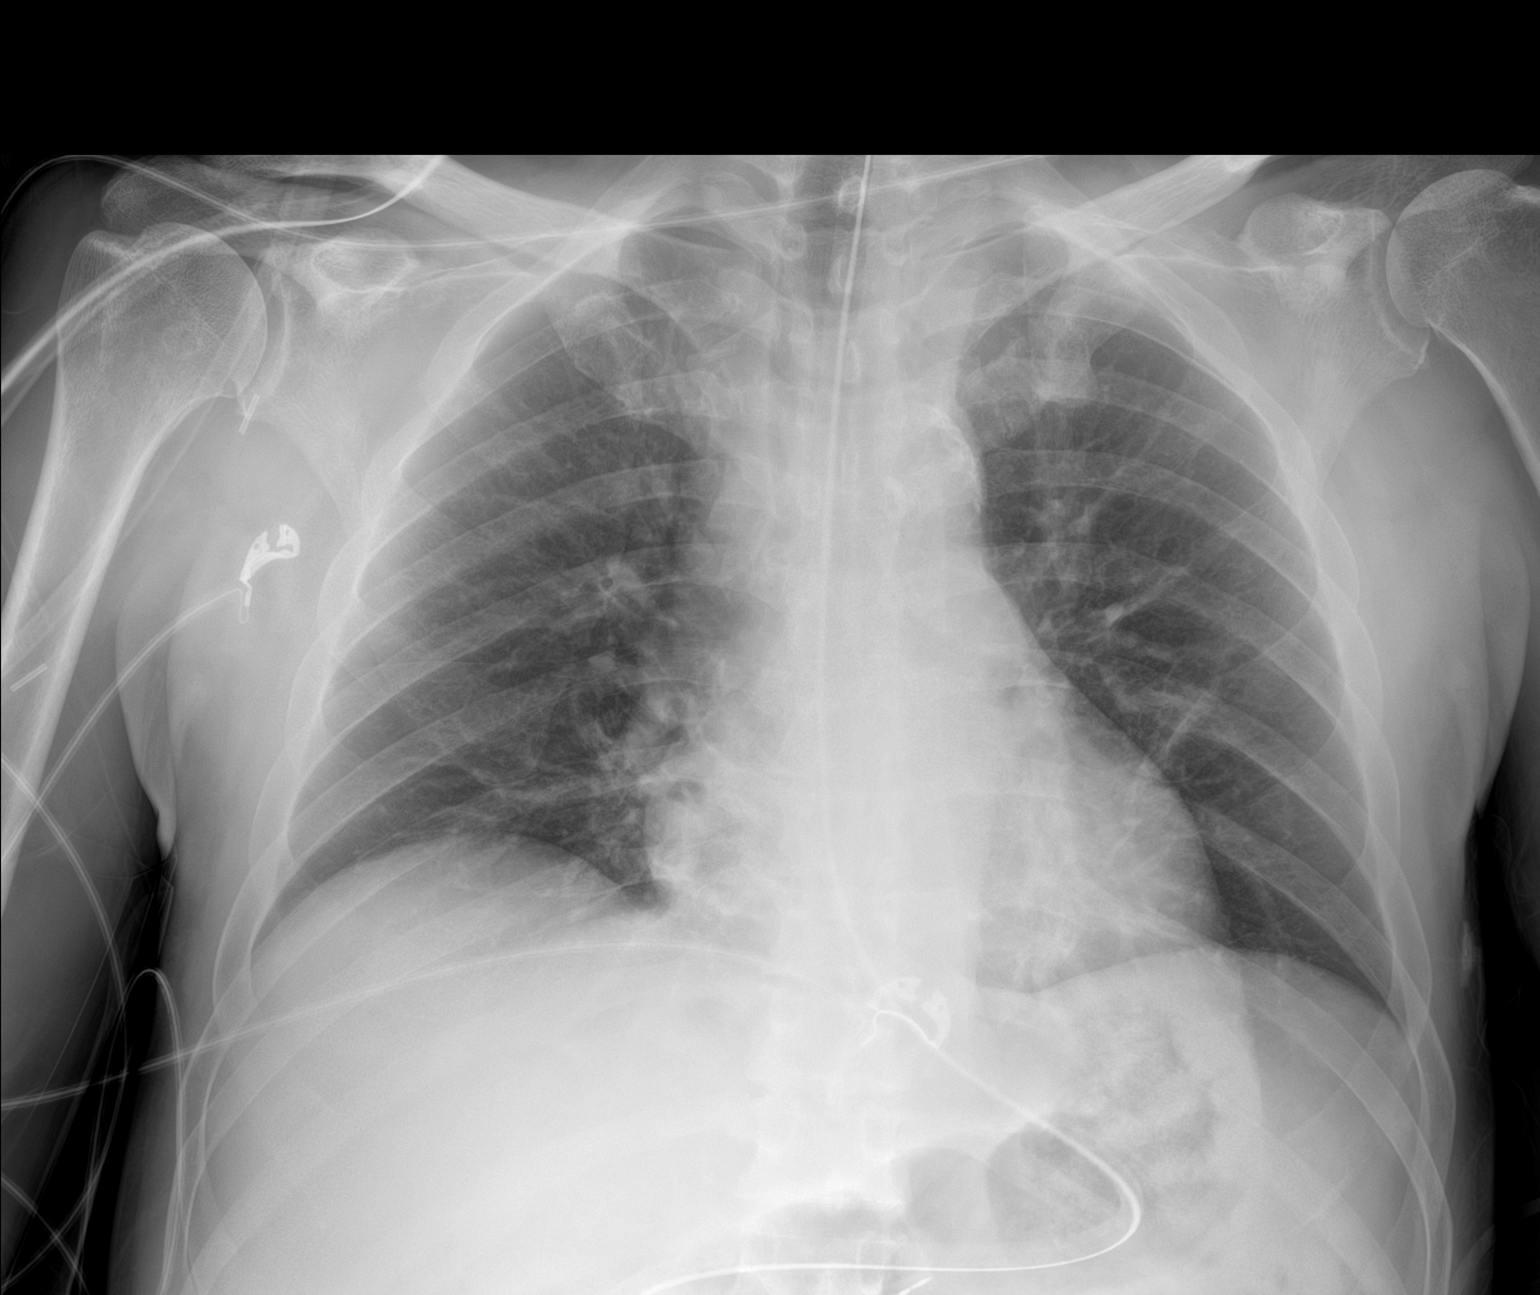

[1 of 1 positions shown; findings below may reference images not displayed]

FINDINGS: Enteric tube passes into the distal stomach with tip out of field of
view. Shallow inspiration with low lung volumes. No new
consolidation or edema. No pleural effusion. Stable
cardiomediastinal contours.
IMPRESSION: No acute process in the chest.

## 2022-11-23 NOTE — Progress Notes (Signed)
[facility] - Clinic Note  11/28/2022   CHIEF COMPLAINT Patient presents for Retina Follow Up  HISTORY OF PRESENT ILLNESS: Dylan Anderson is a 62 y.o. male who presents to the clinic today for:  HPI     Retina Follow Up   Patient presents with  Diabetic Retinopathy.  In both eyes.  This started 2 months ago.  I, the attending physician,  performed the HPI with the patient and updated documentation appropriately.        Comments   Patient here for 2 months retina follow up for NPDR OU. Patient states visin is blurred. Sees all kinds of stuff passing by. When watches TV sees like people or cars pass by. When turns to look nothing there. Uses AT prn.      Last edited by Rennis Chris, MD on 12/01/2022  1:09 AM.    Pt feels like his vision is getting blurry, no new health concerns, pt was taken off insulin 3-4 months ago  Referring physician: Alma Downs, PA-C  [facility] [address] [place],  [place] [address]  HISTORICAL INFORMATION:  Selected notes from the MEDICAL RECORD NUMBER Referred by Alma Downs, PA-C for concern of retinal hemes OU LEE:  Ocular Hx- PMH-   CURRENT MEDICATIONS: No current outpatient medications on file. (Ophthalmic Drugs)   No current facility-administered medications for this visit. (Ophthalmic Drugs)   Current Outpatient Medications (Other)  Medication Sig   clopidogrel (PLAVIX) 75 MG tablet Take 1 tablet (75 mg total) by mouth daily.   glucose blood (TRUE METRIX BLOOD GLUCOSE TEST) test strip Use as instructed. Check blood glucose level by fingerstick three times per day. E11.65   lisinopril (ZESTRIL) 5 MG tablet Take 5 mg by mouth daily.   methocarbamol (ROBAXIN) 500 MG tablet Take 1 tablet (500 mg total) by mouth every 8 (eight) hours as needed for muscle spasms.   TRUEplus Lancets 28G MISC Use as instructed. Check blood glucose level by fingerstick 3 times per day. E11.65   amLODipine  (NORVASC) 5 MG tablet TAKE 1 TABLET (5 MG TOTAL) BY MOUTH DAILY.   atorvastatin (LIPITOR) 80 MG tablet TAKE 1 TABLET (80 MG TOTAL) BY MOUTH DAILY AT 6 PM.   fenofibrate (TRICOR) 48 MG tablet Take 1 tablet (48 mg total) by mouth daily.  Please make appt for refills.   insulin aspart (NOVOLOG) 100 UNIT/ML FlexPen INJECT 10 UNITS INTO THE SKIN 2 (TWO) TIMES DAILY WITH A MEAL.   Insulin Glargine (BASAGLAR KWIKPEN) 100 UNIT/ML Inject 70 Units into the skin at bedtime. (Patient not taking: Reported on 09/26/2022)   metFORMIN (GLUCOPHAGE) 1000 MG tablet TAKE 1 TABLET (1,000 MG TOTAL) BY MOUTH 2 (TWO) TIMES DAILY WITH A MEAL.  Please make appt for refills.   metoprolol succinate (TOPROL-XL) 50 MG 24 hr tablet TAKE 1 TABLET (50 MG TOTAL) BY MOUTH AT BEDTIME.   Multiple Vitamins-Minerals (MULTIVITAMIN WITH MINERALS) tablet Take 1 tablet by mouth daily. (Patient not taking: Reported on 05/10/2022)   nitroGLYCERIN (NITROSTAT) 0.4 MG SL tablet PLACE 1 TABLET (0.4 MG TOTAL) UNDER THE TONGUE EVERY 5 (FIVE) MINUTES AS NEEDED FOR CHEST PAIN.   pantoprazole (PROTONIX) 40 MG tablet TAKE 1 TABLET (40 MG TOTAL) BY MOUTH DAILY.  Please make appt for refills.   pregabalin (LYRICA) 100 MG capsule Take 100 mg by mouth 3 (three) times daily. (Patient not taking: Reported on 05/10/2022)   No current facility-administered medications for this visit. (  Other)   REVIEW OF SYSTEMS: ROS   Positive for: Endocrine, Cardiovascular, Eyes Negative for: Constitutional, Gastrointestinal, Neurological, Skin, Genitourinary, Musculoskeletal, HENT, Respiratory, Psychiatric, Allergic/Imm, Heme/Lymph Last edited by Laddie Aquas, COA on 11/28/2022  1:48 PM.      ALLERGIES No Known Allergies PAST MEDICAL HISTORY Past Medical History:  Diagnosis Date   Acute gout involving toe of left foot 09/21/2017   Anginal pain (HCC)    Anxiety    Arthritis    Back pain    Chest pain in adult 05/26/2018   Chronic cough 07/13/2017   Claudication  (HCC)    Depression    Diabetes mellitus type II, non insulin dependent (HCC) 07/10/2012   Dyspnea    Erectile dysfunction    Gastroesophageal reflux disease 04/27/2017   Hand pain 03/18/2017   Healthcare maintenance 07/12/2017   Hip pain 05/20/2017   Hyperlipidemia 01/25/2017   Hypertension 07/10/2012   Insomnia 02/06/2017   Low back pain 01/25/2017   Peripheral artery disease (HCC) 06/07/2017   PVD (peripheral vascular disease) (HCC)    Tobacco abuse 07/10/2012   Type 2 diabetes mellitus (HCC)    Past Surgical History:  Procedure Laterality Date   ABDOMINAL AORTOGRAM W/LOWER EXTREMITY N/A 06/18/2020   Procedure: ABDOMINAL AORTOGRAM W/LOWER EXTREMITY;  Surgeon: Cephus Shelling, MD;  Location: [facility];  Service: Cardiovascular;  Laterality: N/A;   AORTA - BILATERAL FEMORAL ARTERY BYPASS GRAFT Bilateral 02/18/2021   Procedure: AORTOBIFEMORAL BYPASS GRAFT USING 16 x 8mm HEMASHIELD GOLD;  Surgeon: Cephus Shelling, MD;  Location: [facility] OR;  Service: Vascular;  Laterality: Bilateral;   CARDIAC CATHETERIZATION     ILIAC ARTERY STENT     LEFT HEART CATH AND CORONARY ANGIOGRAPHY N/A 06/19/2018   Procedure: LEFT HEART CATH AND CORONARY ANGIOGRAPHY;  Surgeon: Lennette Bihari, MD;  Location: [facility];  Service: Cardiovascular;  Laterality: N/A;   PERIPHERAL VASCULAR INTERVENTION Bilateral 06/18/2020   Procedure: PERIPHERAL VASCULAR INTERVENTION;  Surgeon: Cephus Shelling, MD;  Location: [facility];  Service: Cardiovascular;  Laterality: Bilateral;  common Iliac   stent Left    left common and left internal illiac stent   FAMILY HISTORY Family History  Problem Relation Age of Onset   Heart failure Mother    Osteoarthritis Mother    Glaucoma Sister    COPD Sister    Stroke Sister    Heart attack Maternal Aunt    Diabetes Maternal Uncle    Heart attack Maternal Uncle    SOCIAL HISTORY Social History   Tobacco Use   Smoking status: Every Day     Current packs/day: 0.25    Average packs/day: 0.3 packs/day for 44.7 years (11.2 ttl pk-yrs)    Types: Cigarettes    Start date: 04/04/1978   Smokeless tobacco: Never   Tobacco comments:    Max 1 ppd  Vaping Use   Vaping status: Never Used  Substance Use Topics   Alcohol use: Yes    Comment: occ   Drug use: No       OPHTHALMIC EXAM:  Base Eye Exam     Visual Acuity (Snellen - Linear)       Right Left   Dist [place] 20/30 -1 20/30   Dist ph [place] NI NI         Tonometry (Tonopen, 1:44 PM)       Right Left   Pressure 12 17         Pupils  Dark Light Shape React APD   Right 2 2 Round NR None   Left 2 1 Round Minimal None         Visual Fields (Counting fingers)       Left Right    Full Full         Extraocular Movement       Right Left    Full, Ortho Full, Ortho         Neuro/Psych     Oriented x3: Yes   Mood/Affect: Normal         Dilation     Both eyes: 1.0% Mydriacyl, 2.5% Phenylephrine @ 1:44 PM           Slit Lamp and Fundus Exam     Slit Lamp Exam       Right Left   Lids/Lashes Dermatochalasis - upper lid, Ptosis Dermatochalasis - upper lid, mild Ptosis   Conjunctiva/Sclera nasal and temporal pinguecula nasal and temporal pinguecula   Cornea trace PEE, trace tear film debris mild arcus   Anterior Chamber moderate depth, narrow angles deep, clear, narrow temporal angle   Iris Round and moderately dilated, No NVI, anterior bowing Round and dilated, No NVI, mild anterior bowing   Lens 2+ Nuclear sclerosis, 2-3+ Cortical cataract, 1+Posterior subcapsular cataract inferior 2+ Nuclear sclerosis, 2-3+ Cortical cataract, 1+Posterior subcapsular cataract inferior   Anterior Vitreous Vitreous syneresis mild syneresis         Fundus Exam       Right Left   Disc Pink and Sharp Pink and Sharp, mild PPA   C/D Ratio 0.5 0.4   Macula Flat, Blunted foveal reflex, scattered MA Flat, Blunted foveal reflex, scattered MA, subretinal  blot hemes IN mac -- fading   Vessels attenuated, Tortuous attenuated, Tortuous, mild copper wiring   Periphery Attached, scattered MA / DBH greatest superior to ST arcades Attached, 360 MA / DBH greatest posteriorly           IMAGING AND PROCEDURES  Imaging and Procedures for 11/28/2022  OCT, Retina - OU - Both Eyes       Right Eye Quality was good. Central Foveal Thickness: 334. Progression has been stable. Findings include normal foveal contour, no IRF, no SRF, intraretinal hyper-reflective material, vitreomacular adhesion (Blunted foveal contour, trace cystic changes superior to fovea).   Left Eye Quality was good. Central Foveal Thickness: 324. Progression has been stable. Findings include normal foveal contour, no IRF, no SRF, vitreomacular adhesion (Blunted foveal contour, irregular lamination, no frank edema).   Notes *Images captured and stored on drive  Diagnosis / Impression:  OD: Blunted foveal contour, trace cystic changes superior to fovea OS: Blunted foveal contour, irregular lamination, no frank edema  Clinical management:  See below  Abbreviations: NFP - Normal foveal profile. CME - cystoid macular edema. PED - pigment epithelial detachment. IRF - intraretinal fluid. SRF - subretinal fluid. EZ - ellipsoid zone. ERM - epiretinal membrane. ORA - outer retinal atrophy. ORT - outer retinal tubulation. SRHM - subretinal hyper-reflective material. IRHM - intraretinal hyper-reflective material            ASSESSMENT/PLAN:   ICD-10-CM   1. Moderate nonproliferative diabetic retinopathy of both eyes without macular edema associated with type 2 diabetes mellitus (HCC)  E11.3393 OCT, Retina - OU - Both Eyes    2. Long term (current) use of oral hypoglycemic drugs  Z79.84     3. Essential hypertension  I10     4. Hypertensive retinopathy of  both eyes  H35.033     5. Retinal hemorrhage of both eyes  H35.63     6. Combined forms of age-related cataract of both eyes   H25.813      1-3. Moderate nonproliferative diabetic retinopathy w/o DME, both eyes  - A1c: 7.4 on 12.13.23 - exam shows scattered MA/DBH OU; no NV OU  - FA (06.24.24) shows OD: No NV, late leakage from MA, focal perivascular leakage ST periphery; OS: Mild perivascular leakage, no NV - OCT without diabetic macular edema, both eyes  - f/u in 3-4 mos -- DFE/OCT  4,5. Hypertensive retinopathy OU - discussed importance of tight BP control - monitor  6. Focal subretinal hemorrhages OU (OS > OD)  - likely multifactorial -- DM, HTN, blood thinner use  - discussed findings, prognosis  - monitor  7. Mixed Cataract OU - The symptoms of cataract, surgical options, and treatments and risks were discussed with patient. - discussed diagnosis and progression - under the expert care of [facility] - clear from a retina standpoint to proceed with cataract surgery when both patient and surgeon are ready  Ophthalmic Meds Ordered this visit:  No orders of the defined types were placed in this encounter.    Return for f/u 3-4 months, NPDR OU, DFE, OCT.  There are no Patient Instructions on file for this visit.  Explained the diagnoses, plan, and follow up with the patient and they expressed understanding.  Patient expressed understanding of the importance of proper follow up care.    This document serves as a record of services personally performed by Karie Chimera, MD, PhD. It was created on their behalf by Annalee Genta, COMT. The creation of this record is the provider's dictation and/or activities during the visit.  Electronically signed by: Annalee Genta, COMT 12/01/22 1:11 AM  This document serves as a record of services personally performed by Karie Chimera, MD, PhD. It was created on their behalf by Glee Arvin. Manson Passey, OA an ophthalmic technician. The creation of this record is the provider's dictation and/or activities during the visit.    Electronically signed by: Glee Arvin. Manson Passey,  OA 12/01/22 1:11 AM  Karie Chimera, M.D., Ph.D. Diseases & Surgery of the Retina and Vitreous [facility] 11/28/2022  I have reviewed the above documentation for accuracy and completeness, and I agree with the above. Karie Chimera, M.D., Ph.D. 12/01/22 1:11 AM   Abbreviations: M myopia (nearsighted); A astigmatism; H hyperopia (farsighted); P presbyopia; Mrx spectacle prescription;  CTL contact lenses; OD right eye; OS left eye; OU both eyes  XT exotropia; ET esotropia; PEK punctate epithelial keratitis; PEE punctate epithelial erosions; DES dry eye syndrome; MGD meibomian gland dysfunction; ATs artificial tears; PFAT's preservative free artificial tears; NSC nuclear sclerotic cataract; PSC posterior subcapsular cataract; ERM epi-retinal membrane; PVD posterior vitreous detachment; RD retinal detachment; DM diabetes mellitus; DR diabetic retinopathy; NPDR non-proliferative diabetic retinopathy; PDR proliferative diabetic retinopathy; CSME clinically significant macular edema; DME diabetic macular edema; dbh dot blot hemorrhages; CWS cotton wool spot; POAG primary open angle glaucoma; C/D cup-to-disc ratio; HVF humphrey visual field; GVF goldmann visual field; OCT optical coherence tomography; IOP intraocular pressure; BRVO Branch retinal vein occlusion; CRVO central retinal vein occlusion; CRAO central retinal artery occlusion; BRAO branch retinal artery occlusion; RT retinal tear; SB scleral buckle; PPV pars plana vitrectomy; VH Vitreous hemorrhage; PRP panretinal laser photocoagulation; IVK intravitreal kenalog; VMT vitreomacular traction; MH Macular hole;  NVD neovascularization of the  disc; NVE neovascularization elsewhere; AREDS age related eye disease study; ARMD age related macular degeneration; POAG primary open angle glaucoma; EBMD epithelial/anterior basement membrane dystrophy; ACIOL anterior chamber intraocular lens; IOL intraocular lens; PCIOL posterior chamber  intraocular lens; Phaco/IOL phacoemulsification with intraocular lens placement; PRK photorefractive keratectomy; LASIK laser assisted in situ keratomileusis; HTN hypertension; DM diabetes mellitus; COPD chronic obstructive pulmonary disease

## 2022-11-28 ENCOUNTER — Ambulatory Visit (INDEPENDENT_AMBULATORY_CARE_PROVIDER_SITE_OTHER): Payer: Medicaid Other | Admitting: Ophthalmology

## 2022-11-28 ENCOUNTER — Encounter (INDEPENDENT_AMBULATORY_CARE_PROVIDER_SITE_OTHER): Payer: Self-pay | Admitting: Ophthalmology

## 2022-11-28 DIAGNOSIS — H3563 Retinal hemorrhage, bilateral: Secondary | ICD-10-CM

## 2022-11-28 DIAGNOSIS — E113393 Type 2 diabetes mellitus with moderate nonproliferative diabetic retinopathy without macular edema, bilateral: Secondary | ICD-10-CM

## 2022-11-28 DIAGNOSIS — H35033 Hypertensive retinopathy, bilateral: Secondary | ICD-10-CM | POA: Diagnosis not present

## 2022-11-28 DIAGNOSIS — I10 Essential (primary) hypertension: Secondary | ICD-10-CM

## 2022-11-28 DIAGNOSIS — Z7984 Long term (current) use of oral hypoglycemic drugs: Secondary | ICD-10-CM | POA: Diagnosis not present

## 2022-11-28 DIAGNOSIS — H25813 Combined forms of age-related cataract, bilateral: Secondary | ICD-10-CM

## 2022-11-28 DIAGNOSIS — Z794 Long term (current) use of insulin: Secondary | ICD-10-CM

## 2022-12-01 ENCOUNTER — Encounter (INDEPENDENT_AMBULATORY_CARE_PROVIDER_SITE_OTHER): Payer: Self-pay | Admitting: Ophthalmology

## 2023-02-27 ENCOUNTER — Encounter (INDEPENDENT_AMBULATORY_CARE_PROVIDER_SITE_OTHER): Payer: Medicaid Other | Admitting: Ophthalmology

## 2023-02-27 DIAGNOSIS — H35033 Hypertensive retinopathy, bilateral: Secondary | ICD-10-CM

## 2023-02-27 DIAGNOSIS — H3563 Retinal hemorrhage, bilateral: Secondary | ICD-10-CM

## 2023-02-27 DIAGNOSIS — E113393 Type 2 diabetes mellitus with moderate nonproliferative diabetic retinopathy without macular edema, bilateral: Secondary | ICD-10-CM

## 2023-02-27 DIAGNOSIS — I10 Essential (primary) hypertension: Secondary | ICD-10-CM

## 2023-02-27 DIAGNOSIS — Z7984 Long term (current) use of oral hypoglycemic drugs: Secondary | ICD-10-CM

## 2023-02-27 DIAGNOSIS — H25813 Combined forms of age-related cataract, bilateral: Secondary | ICD-10-CM

## 2023-05-01 ENCOUNTER — Telehealth (HOSPITAL_COMMUNITY): Payer: Self-pay | Admitting: *Deleted

## 2023-05-01 NOTE — Telephone Encounter (Signed)
OPENED IN ERROR

## 2023-05-10 NOTE — Progress Notes (Signed)
Fax received from [facility] on 04/18/23 for medical clearance/medication hold for colonoscopy to be signed by C. Chestine Spore, MD.  Provider signed on 04/25/23, scanned into pt's chart, faxed back to sender on 04/27/23.

## 2023-06-03 DEATH — deceased
